# Patient Record
Sex: Female | Born: 1935 | Race: White | Hispanic: No | Smoking: Never smoker
Health system: Southern US, Community
[De-identification: ages and names within clinical notes are randomized; demographics above are authoritative.]

## PROBLEM LIST (undated history)

## (undated) DIAGNOSIS — I10 Essential (primary) hypertension: Secondary | ICD-10-CM

## (undated) DIAGNOSIS — K219 Gastro-esophageal reflux disease without esophagitis: Secondary | ICD-10-CM

## (undated) DIAGNOSIS — Z860100 Personal history of colon polyps, unspecified: Secondary | ICD-10-CM

## (undated) DIAGNOSIS — K5790 Diverticulosis of intestine, part unspecified, without perforation or abscess without bleeding: Secondary | ICD-10-CM

## (undated) DIAGNOSIS — D649 Anemia, unspecified: Secondary | ICD-10-CM

## (undated) HISTORY — DX: Gastro-esophageal reflux disease without esophagitis: K21.9

## (undated) HISTORY — DX: Anemia, unspecified: D64.9

## (undated) HISTORY — DX: Personal history of colon polyps, unspecified: Z86.0100

## (undated) HISTORY — PX: APPENDECTOMY: SHX54

## (undated) HISTORY — DX: Personal history of colonic polyps: Z86.010

## (undated) HISTORY — PX: ABDOMINAL HYSTERECTOMY: SHX81

## (undated) HISTORY — PX: FRACTURE SURGERY: SHX138

## (undated) HISTORY — PX: CHOLECYSTECTOMY: SHX55

---

## 1993-12-04 ENCOUNTER — Encounter (INDEPENDENT_AMBULATORY_CARE_PROVIDER_SITE_OTHER): Payer: Self-pay | Admitting: *Deleted

## 2000-01-23 ENCOUNTER — Ambulatory Visit (HOSPITAL_COMMUNITY): Admission: RE | Admit: 2000-01-23 | Discharge: 2000-01-23 | Payer: Self-pay | Admitting: Internal Medicine

## 2000-01-23 ENCOUNTER — Encounter: Payer: Self-pay | Admitting: Internal Medicine

## 2000-01-24 ENCOUNTER — Inpatient Hospital Stay (HOSPITAL_COMMUNITY): Admission: EM | Admit: 2000-01-24 | Discharge: 2000-01-26 | Payer: Self-pay | Admitting: *Deleted

## 2002-11-01 ENCOUNTER — Emergency Department (HOSPITAL_COMMUNITY): Admission: EM | Admit: 2002-11-01 | Discharge: 2002-11-01 | Payer: Self-pay | Admitting: Emergency Medicine

## 2002-11-01 ENCOUNTER — Encounter: Payer: Self-pay | Admitting: Emergency Medicine

## 2003-05-28 ENCOUNTER — Inpatient Hospital Stay (HOSPITAL_COMMUNITY): Admission: EM | Admit: 2003-05-28 | Discharge: 2003-06-02 | Payer: Self-pay | Admitting: Emergency Medicine

## 2003-05-28 ENCOUNTER — Encounter: Payer: Self-pay | Admitting: Emergency Medicine

## 2003-05-28 ENCOUNTER — Encounter: Payer: Self-pay | Admitting: Orthopedic Surgery

## 2003-05-30 ENCOUNTER — Encounter: Payer: Self-pay | Admitting: Internal Medicine

## 2003-05-31 ENCOUNTER — Encounter: Payer: Self-pay | Admitting: Cardiology

## 2003-06-01 ENCOUNTER — Encounter: Payer: Self-pay | Admitting: Internal Medicine

## 2004-07-17 ENCOUNTER — Ambulatory Visit: Payer: Self-pay | Admitting: Internal Medicine

## 2004-10-04 ENCOUNTER — Ambulatory Visit: Payer: Self-pay | Admitting: Internal Medicine

## 2004-11-14 ENCOUNTER — Ambulatory Visit: Payer: Self-pay | Admitting: Internal Medicine

## 2004-11-15 ENCOUNTER — Encounter: Admission: RE | Admit: 2004-11-15 | Discharge: 2004-11-15 | Payer: Self-pay | Admitting: Internal Medicine

## 2004-12-12 ENCOUNTER — Ambulatory Visit: Payer: Self-pay | Admitting: Internal Medicine

## 2005-01-09 ENCOUNTER — Ambulatory Visit: Payer: Self-pay | Admitting: Internal Medicine

## 2005-01-19 ENCOUNTER — Ambulatory Visit: Payer: Self-pay | Admitting: Cardiology

## 2005-02-26 ENCOUNTER — Ambulatory Visit: Payer: Self-pay | Admitting: Gastroenterology

## 2005-07-25 ENCOUNTER — Ambulatory Visit: Payer: Self-pay | Admitting: Internal Medicine

## 2005-09-05 ENCOUNTER — Ambulatory Visit: Payer: Self-pay | Admitting: Internal Medicine

## 2005-11-06 ENCOUNTER — Ambulatory Visit: Payer: Self-pay | Admitting: Internal Medicine

## 2005-11-13 ENCOUNTER — Ambulatory Visit: Payer: Self-pay | Admitting: *Deleted

## 2006-01-23 ENCOUNTER — Encounter: Admission: RE | Admit: 2006-01-23 | Discharge: 2006-01-23 | Payer: Self-pay | Admitting: Internal Medicine

## 2007-01-22 ENCOUNTER — Emergency Department (HOSPITAL_COMMUNITY): Admission: EM | Admit: 2007-01-22 | Discharge: 2007-01-22 | Payer: Self-pay | Admitting: Emergency Medicine

## 2007-11-28 DIAGNOSIS — Z86010 Personal history of colonic polyps: Secondary | ICD-10-CM | POA: Insufficient documentation

## 2008-04-14 ENCOUNTER — Ambulatory Visit: Payer: Self-pay | Admitting: Internal Medicine

## 2008-05-11 ENCOUNTER — Ambulatory Visit: Payer: Self-pay | Admitting: Internal Medicine

## 2008-05-11 DIAGNOSIS — R51 Headache: Secondary | ICD-10-CM | POA: Insufficient documentation

## 2008-05-11 DIAGNOSIS — D649 Anemia, unspecified: Secondary | ICD-10-CM | POA: Insufficient documentation

## 2008-05-11 DIAGNOSIS — R519 Headache, unspecified: Secondary | ICD-10-CM | POA: Insufficient documentation

## 2008-05-11 LAB — CONVERTED CEMR LAB
BUN: 13 mg/dL (ref 6–23)
Calcium: 9.4 mg/dL (ref 8.4–10.5)
Creatinine, Ser: 0.8 mg/dL (ref 0.4–1.2)
Eosinophils Absolute: 0.3 10*3/uL (ref 0.0–0.7)
Eosinophils Relative: 4.7 % (ref 0.0–5.0)
GFR calc Af Amer: 91 mL/min
GFR calc non Af Amer: 76 mL/min
HCT: 35.3 % — ABNORMAL LOW (ref 36.0–46.0)
MCV: 85.5 fL (ref 78.0–100.0)
Monocytes Absolute: 0.3 10*3/uL (ref 0.1–1.0)
Neutro Abs: 4.7 10*3/uL (ref 1.4–7.7)
Platelets: 246 10*3/uL (ref 150–400)
Potassium: 4.5 meq/L (ref 3.5–5.1)
WBC: 6.8 10*3/uL (ref 4.5–10.5)

## 2008-05-20 ENCOUNTER — Telehealth: Payer: Self-pay | Admitting: Internal Medicine

## 2008-05-24 ENCOUNTER — Encounter: Payer: Self-pay | Admitting: Gastroenterology

## 2008-05-24 ENCOUNTER — Encounter (INDEPENDENT_AMBULATORY_CARE_PROVIDER_SITE_OTHER): Payer: Self-pay | Admitting: *Deleted

## 2008-07-30 DIAGNOSIS — K921 Melena: Secondary | ICD-10-CM | POA: Insufficient documentation

## 2008-07-30 DIAGNOSIS — R1032 Left lower quadrant pain: Secondary | ICD-10-CM | POA: Insufficient documentation

## 2008-07-30 DIAGNOSIS — K5730 Diverticulosis of large intestine without perforation or abscess without bleeding: Secondary | ICD-10-CM | POA: Insufficient documentation

## 2008-07-30 DIAGNOSIS — R1310 Dysphagia, unspecified: Secondary | ICD-10-CM | POA: Insufficient documentation

## 2008-09-16 ENCOUNTER — Telehealth: Payer: Self-pay | Admitting: Internal Medicine

## 2008-10-19 ENCOUNTER — Encounter: Admission: RE | Admit: 2008-10-19 | Discharge: 2008-10-19 | Payer: Self-pay | Admitting: Internal Medicine

## 2008-10-29 ENCOUNTER — Ambulatory Visit: Payer: Self-pay | Admitting: Internal Medicine

## 2008-11-05 ENCOUNTER — Telehealth (INDEPENDENT_AMBULATORY_CARE_PROVIDER_SITE_OTHER): Payer: Self-pay | Admitting: *Deleted

## 2008-11-16 ENCOUNTER — Telehealth (INDEPENDENT_AMBULATORY_CARE_PROVIDER_SITE_OTHER): Payer: Self-pay | Admitting: *Deleted

## 2008-11-24 ENCOUNTER — Ambulatory Visit: Admission: RE | Admit: 2008-11-24 | Discharge: 2008-11-24 | Payer: Self-pay | Admitting: Family Medicine

## 2008-11-24 ENCOUNTER — Encounter: Payer: Self-pay | Admitting: Family Medicine

## 2008-11-24 ENCOUNTER — Ambulatory Visit: Payer: Self-pay | Admitting: Vascular Surgery

## 2010-10-02 ENCOUNTER — Encounter: Payer: Self-pay | Admitting: Internal Medicine

## 2010-10-02 ENCOUNTER — Encounter: Payer: Self-pay | Admitting: Orthopedic Surgery

## 2010-10-02 ENCOUNTER — Encounter: Payer: Self-pay | Admitting: Emergency Medicine

## 2010-11-01 ENCOUNTER — Other Ambulatory Visit: Payer: Self-pay | Admitting: Gastroenterology

## 2010-11-03 ENCOUNTER — Other Ambulatory Visit: Payer: Self-pay

## 2010-11-06 ENCOUNTER — Ambulatory Visit
Admission: RE | Admit: 2010-11-06 | Discharge: 2010-11-06 | Disposition: A | Discharge status: Home / Self Care | Payer: Medicare Other | Source: Ambulatory Visit | Attending: Gastroenterology | Admitting: Gastroenterology

## 2010-11-06 MED ORDER — IOHEXOL 300 MG/ML  SOLN
125.0000 mL | Freq: Once | INTRAMUSCULAR | Status: AC | PRN
  Administered 2010-11-06: 125 mL via INTRAVENOUS

## 2010-12-27 ENCOUNTER — Ambulatory Visit (HOSPITAL_COMMUNITY)
Admission: RE | Admit: 2010-12-27 | Discharge: 2010-12-27 | Disposition: A | Discharge status: Home / Self Care | Payer: Medicare Other | Source: Ambulatory Visit | Attending: Orthopedic Surgery | Admitting: Orthopedic Surgery

## 2010-12-27 DIAGNOSIS — M7989 Other specified soft tissue disorders: Secondary | ICD-10-CM | POA: Insufficient documentation

## 2010-12-27 DIAGNOSIS — M79609 Pain in unspecified limb: Secondary | ICD-10-CM | POA: Insufficient documentation

## 2011-01-26 NOTE — Discharge Summary (Signed)
NAME:  Ellen Johnson, CAYSON                         ACCOUNT NO.:  [id number]   MEDICAL RECORD NO.:  [id number]                   PATIENT TYPE:  INP   LOCATION:  3727                                 FACILITY:  [facility]   PHYSICIAN:  Rene Paci, M.D. [facility]          DATE OF BIRTH:  1936-06-03   DATE OF ADMISSION:  05/28/2003  DATE OF DISCHARGE:  06/02/2003                                 DISCHARGE SUMMARY   DISCHARGE DIAGNOSES:  1. Fall.  2. Syncope.  3. Right open ulnar fracture.  4. Right closed radius fracture.  5. Subarachnoid hemorrhage.  6. Post-concussion syndrome.   BRIEF ADMISSION HISTORY:  Ellen Johnson is a 75 year old white female who  worked until midnight on the evening prior to admission.  She went to bed  around 12:30 a.m.  She felt tired when she went to bed.  She woke up at 4  a.m. and she felt dizzy lying in the bed.  She got up to go to the bathroom.  She stated the room was spinning but she had no nausea or vomiting.  Walking  back to bed, she started down the stairs; she does not recall doing this.  She does not recall falling.  Son and husband found her but no loss of  consciousness; she was somewhat incoherent.  She has no prior history of  syncope or presyncope, no history of vertigo or arrhythmias or positional  dyspnea, no history of seizure and she had no loss of bladder or bowel  incontinence.  The patient was brought to the emergency room, where she was  found to have an open ulnar fracture.  The patient was admitted for  orthopedic surgery.   PAST MEDICAL HISTORY:  1. Remote peptic ulcer disease and gastroesophageal reflux disease.  2. Removal of a benign left breast lump.  3. Status post TAH/BSO secondary to what sounds like pelvic inflammatory     disease related to an IUD.   HOSPITAL COURSE:  PROBLEM #1 - ORTHOPEDICS:  As noted, the patient had a  fall, sustaining a right clavicle fracture as well as an open right ulnar  fracture and a closed right  radial fracture.  She underwent open reduction  and internal fixation of the right open fracture and a closed reduction of  the closed radius fracture.  The patient is currently in a cast and sling  and is scheduled to follow up with Dr. Elana Alm. Thurston Hole on Monday, June 07, 2003.   PROBLEM #2 - FALL:  Etiology of her fall sounded like it was syncopal.  Initially on presentation, she had no evidence of orthostatic hypotension  and no arrhythmias were noted.  We were concerned that perhaps this was  labyrinthitis versus BPPV.  She had an MRI on admission; this revealed a  widespread subarachnoid hemorrhage in the posterior fossa that was thought  to be post-traumatic, but they could not rule out underlying  ruptured  aneurysm or AVM.  The MRA was normal.  She had a head CT to follow up the  MRI that revealed small vessel disease and again the subarachnoid hemorrhage  without any skull fractures and again they thought it was most likely  posttraumatic, but could not rule out aneurysm versus dural AVM versus an  acute stroke.  This prompted a neurological consult.  The patient was seen  in consultation by Dr. Genene Churn. Love, who agreed that her subarachnoid  hemorrhage was probably traumatic.  He felt her dizziness was secondary to  BPPV.  He recommended an EEG, which was normal.  He also ordered a  cerebellar arteriogram that was essentially normal with no angiographic  evidence of intracranial aneurysms, AVMs, dissection of the intra or  extracranial vessels or stenosis to suggest spasm.  He also noted that the  venous sinuses were normally patent.  The patient is still having dizziness.  Dr. Sandria Manly thinks that she has a post-concussion syndrome secondary to the  trauma of the fall and the subarachnoid hemorrhage.  He recommended a low  dose of Ativan twice daily (b.i.d.) as well as amitriptyline at night.  He  stated she might be a good candidate for vestibular rehab, but not for at   least two weeks.  We will have the patient follow up with her primary care  physician, who can schedule or arrange outpatient vestibular rehab at [facility] if she is continuing to be symptomatic then at that followup.   PROBLEM #3 - INFECTIOUS DISEASE:  The patient was empirically started on  Cipro during this admission for mild fever and white count.  Etiology was  never clear, but we will have her complete a full 10-day course.   LABORATORIES PRIOR TO DISCHARGE:  Coagulations were normal.  Hemoglobin was  10.5, hematocrit was 30.5.  BMET was normal except for blood sugars in the  one-teens.  LFTs were normal.  TSH was 1.220.   MEDICATIONS AT DISCHARGE:  1. Cipro 500 mg b.i.d. for five more days.  2. Ativan 0.5 mg one-half tab b.i.d.  3. Amitriptyline 10 mg nightly.  4. Tylenol for pain.   ACTIVITY:  The patient has been instructed not to drive or work until she  follows up with Dr. Cristi Loron.   WOUND CARE:  Wound care is per orthopedics.   FOLLOWUP:  The patient does need to follow up with Dr. Thurston Hole on Monday,  June 07, 2003, and we will schedule followup with the patient with Dr.  Lovell Sheehan in two weeks.      Cornell Barman, P.A. [facility]                  Rene Paci, M.D. [facility]    LC/MEDQ  D:  06/02/2003  T:  06/03/2003  Job:  [id number]   cc:   Genene Churn. Love, M.D.  [address]  [address]  [place]  [place] [address]  Fax: [phone]   Stacie Glaze, M.D. [facility]   Robert A. Thurston Hole, M.D.  [address]  [place] [address]  Fax: [phone]

## 2011-01-26 NOTE — Discharge Summary (Signed)
[facility]  Patient:    Ellen Johnson, Ellen Johnson                      MRN: [id number] Adm. Date:  [id number] Disc. Date: [id number] Attending:  Heber Marshall CC:         Stacie Glaze, M.D. [facility], [place], [place]                           Discharge Summary  ADMISSION DIAGNOSES: 1. Diverticulitis with left lower quadrant abdominal pain. 2. Obstipation.  DISCHARGE DIAGNOSES: 1. Diverticulitis with left lower quadrant abdominal pain. 2. Obstipation.  HISTORY OF PRESENT ILLNESS:  Ellen Johnson is a 75 year old white married female with a history of constipation, who presented to the emergency department with severe abdominal pain.  She stated that over the past nine days, she has been unable to have a bowel movement despite multiple efforts with enemas, laxatives and dietary fiber.  She complains of crampy abdominal pain associated with nausea.  She complains of left-sided tenderness.  She denied any vomiting but does complain of nausea.  Please see the H&P for past medical history, family history and social history.  PHYSICAL EXAMINATION:  Physical exam at admission remarkable for temperature of 97.4, blood pressure was 100/60, abdomen with decreased bowel sounds, moderate-to-severe tenderness in the left lower quadrant with mild diffuse tenderness.  LABORATORY AND X-RAY FINDINGS:  Lab work was significant for a WBC of 7100 and electrolytes were normal.  Acute abdominal series showed no distention or obstruction.  There was increased amount of stool.  CT scan showed inflammatory changes in the sigmoid colon with possible mild bowel wall thickening consistent with diverticulitis.  HOSPITAL COURSE:  Patient was admitted to the hospital and started on Unasyn 3 g IV q.6h.  Patient also was given antiemetics.  The patient did well with this therapy.  She remained afebrile.  Her discomfort improved.  She did have minimal bowel movement.  With her  being afebrile, with the WBC being normal, with diagnosis being established, patient is felt to be stable and will be discharged home on oral antibiotics for an additional 10 days.  DISCHARGE EXAMINATION:  Patient was afebrile.  Abdomen:  She had hypoactive bowel sounds.  She had no guarding.  She did have significant tenderness in the left lower quadrant.  DISCHARGE MEDICATIONS: 1. Augmentin 875 mg b.i.d. for 10 days. 2. Phenergan 25 mg p.o. q.6h. p.r.n.  DISPOSITION:  Patient is discharged home.  She is carefully instructed to convalesce and to take her antibiotics.  She should notify the office if she has significant problem taking her antibiotics or if she has increasing pain. She is to use Milk of Magnesia for a gentle laxative.  Patient is scheduled to see Dr. Hedwig Johnson. Ellen Johnson, M.D. for GI followup on July 13th at 11 a.m.  CONDITION AT TIME OF DISCHARGE:  The patient is stable and improved. DD: 01/26/00 TD:  01/30/00 Job: [id number]

## 2011-01-26 NOTE — H&P (Signed)
[facility]  Patient:    Ellen Johnson, Ellen Johnson                      MRN: [id number] Adm. Date:  [id number] Attending:  Harrel Carina CC:         Ellen Johnson, M.D. [facility] [place] [facility]                         History and Physical  DATE OF BIRTH:  1936/09/01  HISTORY OF PRESENT ILLNESS:  Ellen Johnson is a 75 year old white female with a long history of constipation, who presented to the emergency room with severe abdominal pain.  She states that over the past nine days she has been unable to have a bowel movement despite multiple efforts with enemas, laxatives, and dietary fiber.  She does complain of crampy abdominal pain associated with nausea.  She also complains of left-sided tenderness.  She denies any vomiting but does complain of nausea.  She denies any fever but does complain of chills.  PAST MEDICAL HISTORY:  Significant for chronic constipation since childhood. The patient remembers using enemas as a child.  She is status post cholecystectomy.  She has a history of "low blood."  History of left breast lumpectomy x 3, all benign, by Ellen Johnson.  ALLERGIES:  VALIUM which "makes her crazy."  MEDICATIONS:  None.  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  She has never smoked. She drinks one glass of wine weekly.  She is married x 40 years, with four children.  She is presently raising her grandchildren ages 41, 10, and 57.  FAMILY HISTORY:  Grandmother died of breast cancer.  Father died of prostate cancer.  There are multiple members on her maternal side who had CVAs.  REVIEW OF SYSTEMS:  No vomiting, fever, or dysuria.  Other systems are negative.  PHYSICAL EXAMINATION:  VITAL SIGNS:  Blood pressure 100/60, pulse 72, respirations 16, temperature 97.4.  GENERAL:  She is a well-nourished, well-developed, white female, anxious but in no acute distress.  HEENT:  Atraumatic, normocephalic.  Sclerae without icterus.   Pupils are equal, round, and reactive to light.  Oropharynx is clear.  NECK:  Supple without adenopathy.  LUNGS:  Clear to auscultation throughout all fields.  No wheezing.  No rhonchi.  CARDIAC:  Mild tachycardic with S1 and S2.  There is no S3 or S4.  No rubs or gallops.  ABDOMEN:  Decreased bowel sounds.  There is moderate to severe tenderness to the left lower quadrant.  There is mild diffuse tenderness.  No masses are appreciated.  EXTREMITIES:  No edema.  There is no cyanosis or clubbing.  Pulses are 2+.  SKIN:  No rashes.  NEUROLOGIC:  The patient is anxious.  She is oriented and follows commands. Strength is normal.  Reflexes are symmetrical.  LABORATORY:  WBC 7100, hemoglobin 12.6.  Electrolytes within normal limits with BUN and creatinine 13 and 0.9, respectively.  Acute abdominal series shows no distention or obstruction.  There is increased amount of stool.  CT scan shows increase in amount of stool with inflammatory changes in the sigmoid colon and possible mild bowel wall thickening.  This is consistent with diverticulitis.  There is no abscess.  ASSESSMENT AND PLAN:  1. Diverticulitis.  The patient will be hydrated.  We will start Unasyn.     Exam will be followed.  Would consider gastrointestinal consult while     in-house, but the patient desires Dr. Lina Johnson and this will be set up     as an outpatient.  2. Abdominal pain.  Will send urinalysis and culture and sensitivity.     Abdominal pain is probably secondary to her diverticulitis.  3. Obstipation.  She will be hydrated and given clear liquids.  I will hold     off on laxatives or further enemas at this point.  4. Nausea.  She will be provided antiemetics as needed.  5. Anxiety.  Supportive therapy in the acute illness. DD:  01/24/00 TD:  01/24/00 Job: [id number]

## 2011-01-26 NOTE — Letter (Signed)
October 29, 2008    Ellen Johnson  [address]/[place], [place] [address]   RE:  [name], [name]  MRN:  [id number]  /  DOB:  [birth year]   Dear [name],   I am sorry to inform Ellen Johnson that we will have to dismiss Ellen Johnson from my  gastroenterology clinic since Ellen Johnson have not kept your appointments.  Ellen Johnson  were a no show on August 03, 2008, September 13, 2008, and in February 26, 2005.  Ellen Johnson had cancelled late yesterday for today's appointment.  Ellen Johnson  have four weeks to find  a new gastroenterologist.  In the meantime, if  there is an emergency, I will  take care of your GI needs.    Sincerely,      Hedwig Morton. Juanda Chance, MD  Electronically Signed    DMB/MedQ  DD: 10/29/2008  DT: 10/29/2008  Job #: [id number]

## 2011-01-26 NOTE — Op Note (Signed)
   NAME:  Ellen Johnson, Ellen Johnson                         ACCOUNT NO.:  [id number]   MEDICAL RECORD NO.:  [id number]                   PATIENT TYPE:  INP   LOCATION:  1827                                 FACILITY:  [facility]   PHYSICIAN:  Elana Alm. Thurston Hole, M.D.              DATE OF BIRTH:  02-10-1936   DATE OF PROCEDURE:  05/28/2003  DATE OF DISCHARGE:                                 OPERATIVE REPORT   PREOPERATIVE DIAGNOSIS:  Right open ulna fracture and closed distal radius  fracture.   POSTOPERATIVE DIAGNOSIS:  Right open ulna fracture and closed distal radius  fracture.   PROCEDURE:  1. Irrigation and debridement right open ulna fracture.  2. Open reduction and intermedullary rodding right open ulna fracture.  3. Closed reduction of right distal radius fracture.   SURGEON:  Elana Alm. Thurston Hole, M.D.   ASSISTANT:  Julien Girt, P.A.   ANESTHESIA:  General.   OPERATIVE TIME:  One hour.   COMPLICATIONS:  None.   DESCRIPTION OF PROCEDURE:  Ms. Prashad is brought to the operating room on  May 28, 2003 and placed on the operative table in a supine position.  After an adequate level of general anesthesia was obtained she received  Ancef 2 grams IV preoperatively for prophylaxis.  Her right arm and hand  were prepped using sterile Betadine and draped using sterile technique.  Initially, the traumatic wound which was approximately 10 cm was thoroughly  irrigated and debrided with 3 liters of pulse lavage and antibiotic  solution.  After this was done and thorough irrigation and debridement was  carried out, then an attempt at closed reduction of the ulna was tried but  this was unsuccessful.  A separate small 3 cm incision was then made over  the ulna shaft fracture and then the fracture was subperiosteally exposed  and reduced.  Then, from an olecranon site a 2.5 mm intermedullary pin was  placed from the olecranon distally down the ulna shaft under fluoroscopic  control and  across the fracture site and into the distal ulna shaft holding  the fracture in a reduced and anatomic position.  After this was done, then  both wounds were further irrigated with antibiotic solution and saline.  The  surgical wound was closed with 2-0 Vicryl and skin staples.  The traumatic  wound was closed loosely with interrupted 3-0 nylon mattress sutures.  Sterile dressings were applied and then the distal radius fracture was  reduced with palmar flexion and ulnar deviation reduction.  The arm was then  placed in a sugar tong splint.  After this was done the patient was  awakened, extubated, and taken to the recovery room in stable condition.  Needle and sponge counts correct x2 at the end of the case.      RAW/MEDQ  D:  05/28/2003  T:  05/29/2003  Job:  [id number]

## 2011-01-26 NOTE — H&P (Signed)
NAME:  Ellen Johnson, Ellen Johnson                         ACCOUNT NO.:  [id number]   MEDICAL RECORD NO.:  [id number]                   PATIENT TYPE:  INP   LOCATION:  1827                                 FACILITY:  [facility]   PHYSICIAN:  Elana Alm. Thurston Hole, M.D.              DATE OF BIRTH:  08/28/1936   DATE OF ADMISSION:  05/28/2003  DATE OF DISCHARGE:                                HISTORY & PHYSICAL   ADMITTING DIAGNOSES:  1. Right open ulnar fracture.  2. Right distal radius fracture.  3. Right clavicle fracture.  4. Syncopal episode.  5. History of gastroesophageal reflux.  6. History of removal of benign breast lumps on the left.  7. History of pelvic inflammatory disease leading to total abdominal     hysterectomy and bilateral salpingo-oophorectomy.   HISTORY OF PRESENT ILLNESS:  The patient is a 75 year old white female who  worked till midnight last night, went to bed at about 12:30, woke up at 4  a.m. and stated she felt dizzy by lying in bed.  She got up to go to the  bathroom and the room was spinning.  She was walking back to bed and started  down the stairs; she does not recall falling.  Her husband her son found her  incoherent at the bottom of the stairs.  She was taken to [facility] ER, at  that time noted to have a right clavicle fracture, a large laceration on her  right arm and ulnar fracture and a distal radius fracture.   ALLERGIES:  VALIUM.   CURRENT MEDICATIONS:  1. She takes 11 different vitamins.  2. Aspirin 325 mg a day.   MEDICAL HISTORY:  Medical history significant for peptic ulcer disease,  reflux, benign breast lumps.   SURGICAL HISTORY:  Surgical history significant for a TAH and a BSO  secondary to PID related to an IUD.   FAMILY HISTORY:  Family history significant for GYN cancer, breast cancer  and stroke.   SOCIAL HISTORY:  The patient is married with four children.  She is  currently raising three grandchildren.  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  Occasionally drinks wine. States she smokes a cigarette a month.   REVIEW OF SYSTEMS:  Review of systems positive for recent episodes of  dizziness.  No shortness of breath.  No chest pain.  No nausea, vomiting,  diarrhea, constipation, shortness of breath.   PHYSICAL EXAMINATION:  VITAL SIGNS:  Heart rate 80, 146/82, respirations 20,  temperature 98.2.  HEENT:  She is normocephalic, atraumatic.  Extraocular movements are intact.  Pupils are equally round and reactive to light and accommodation.  SKIN/MUSCULOSKELETAL:  She has a large laceration on her right forearm.  She  has an obviously swollen right wrist.  She has tenderness over her right  shoulder.  She had no pain on her left side.  She has no lower extremity  pain.  CHEST AND LUNGS:  Clear to auscultation.  HEART:  Heart has a regular rate and rhythm with no murmur, rub or gallop.  ABDOMEN:  Abdomen is soft, nontender with positive bowel sounds.  EXTREMITIES:  Right arm:  Large laceration.  Right wrist swollen.  Distal  neurovascular exam is intact.  NEUROLOGIC:  She is alert and oriented, slightly verbose, currently had  morphine.   ASSESSMENT:  1. Dizziness, loss of consciousness, fall.  2. Right forearm open fracture.  3. Right wrist distal radius intra-articular fracture, closed.  4. Right clavicle fracture, closed.   PLAN:  At this point in time, she will be admitted.  She has received  medical clearance from [facility] for surgery to undergo open  reduction and internal fixation and irrigation and debridement of her right  forearm.  Her clavicle will be treated closed.  Her wrist will be treated  closed.  We will continue to follow her in the hospital.      Julien Girt, P.A.                  Robert A. Thurston Hole, M.D.    KS/MEDQ  D:  05/28/2003  T:  05/30/2003  Job:  [id number]

## 2011-01-26 NOTE — Consult Note (Signed)
NAME:  Ellen Johnson, Ellen Johnson                         ACCOUNT NO.:  [id number]   MEDICAL RECORD NO.:  [id number]                   PATIENT TYPE:  INP   LOCATION:  3727                                 FACILITY:  [facility]   PHYSICIAN:  Genene Churn. Love, M.D.                 DATE OF BIRTH:  November 29, 1935   DATE OF CONSULTATION:  05/31/2003  DATE OF DISCHARGE:                                   CONSULTATION   CHIEF COMPLAINT:  This 75 year old right-handed white married female was  admitted May 28, 2003 for evaluation of head trauma with documented  subarachnoid hemorrhage.   HISTORY OF PRESENT ILLNESS:  Mrs. 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  She awoke at 4 a.m. and noted dizziness which was vague in  quality.  She went to the bathroom and does not remember any other events.  The stairwell is next to the bathroom and she fell down eight steps to the  first landing.  The noise was heard by her son and husband who arrived and  found the patient unconscious.  The next thing the patient remembers is  being in the [facility] Emergency Room.  She had a fracture to her  right clavicle and her right radius and right ulnar bone.  She also had  swelling to her right wrist.  She has complained of some headache and  dizziness since.  An MRI study of the brain showed blood in the subarachnoid  space posteriorly and also an anti hemispheric fissure.  There was no  evidence of an acute stroke and intracranial MRA was negative.  A CT scan  confirmed the blood present.  The patient has no family history of aneurysm  and has had no prior history of seizures.  She denies any warning macropsy,  micropsy, deejay vu, strange odors or tastes.  She has no known history of  high blood pressure or diabetes.  There is no history of drug or alcohol  abuse.   PAST MEDICAL HISTORY:  1. Hysterectomy in 1991.  2.  Cholecystectomy in 1996.   CURRENT MEDICATIONS:  11 vitamins.   PHYSICAL EXAMINATION:  GENERAL:  Well-developed white female with a cast on  her right arm and right arm in a sling and also a figure-eight for her  collar bone.  She was alert and oriented x3 and followed one, two, and three  step commands.  VITAL SIGNS:  Blood pressure left arm lying 120/80, standing 120/80, heart  rate 104.  NECK:  Supple.  HEENT:  Tympanic membranes were clear.  There was no evidence of any  hemotympanum and no CSF drainage present.  NEUROLOGIC:  Cranial nerve examination revealed visual fields full, disks  flat, spontaneous venous pulsations seen.  Extraocular movements full.  No  7th nerve palsy.  Tongue was midline and not bit.  Uvula was midline.  Gags  were present.  Sternocleidomastoid, trapezius testing was normal.  Motor  examination revealed in the cast she could move each finger in her right  hand and sensory in the five fingers of the right hand was normal.  She  could abduct her right arm at the shoulder.  Otherwise, I could not test her  right arm.  Her left arm was essentially 5/5.  Her legs were 5/5 two point  discrimination grab procedure was present in her left hand.  Pin prick,  vibration was present bilaterally.  Deep tendon reflexes were 2+.  Plantar  responses were downgoing.  She could stand.  When looking down she developed  clockwise nystagmus which lasted several seconds associated with vertigo.   IMPRESSION:  1. Subarachnoid hemorrhage probably posttraumatic (code 430).  2. Dizziness with suspected BT TV on examination (code 386.11).  3. Concussion (code 850.2).  4. Syncope (code 780.2).   PLAN:  Proceed with EEG and consider arteriogram depending on the results.                                               Genene Churn. Sandria Manly, M.D.    JML/MEDQ  D:  05/31/2003  T:  05/31/2003  Job:  [id number]

## 2011-08-15 ENCOUNTER — Ambulatory Visit (INDEPENDENT_AMBULATORY_CARE_PROVIDER_SITE_OTHER): Payer: Medicare Other

## 2011-08-15 DIAGNOSIS — Z23 Encounter for immunization: Secondary | ICD-10-CM

## 2011-10-11 ENCOUNTER — Ambulatory Visit (INDEPENDENT_AMBULATORY_CARE_PROVIDER_SITE_OTHER): Payer: Medicare Other | Admitting: Family Medicine

## 2011-10-11 ENCOUNTER — Other Ambulatory Visit: Payer: Self-pay

## 2011-10-11 ENCOUNTER — Ambulatory Visit: Payer: Medicare Other

## 2011-10-11 VITALS — BP 154/85 | HR 71 | Temp 97.8°F | Resp 16 | Ht 63.5 in | Wt 205.6 lb

## 2011-10-11 DIAGNOSIS — M79609 Pain in unspecified limb: Secondary | ICD-10-CM

## 2011-10-11 DIAGNOSIS — M79651 Pain in right thigh: Secondary | ICD-10-CM

## 2011-10-11 LAB — POCT CBC
Granulocyte percent: 59.7 %G (ref 37–80)
HCT, POC: 37.1 % — AB (ref 37.7–47.9)
Hemoglobin: 11.7 g/dL — AB (ref 12.2–16.2)
MCV: 84 fL (ref 80–97)
POC LYMPH PERCENT: 33 %L (ref 10–50)
RBC: 4.42 M/uL (ref 4.04–5.48)

## 2011-10-11 MED ORDER — MELOXICAM 15 MG PO TABS: 15.0000 mg | ORAL_TABLET | Freq: Every day | ORAL | Status: AC

## 2011-10-11 MED ORDER — TRAMADOL HCL 50 MG PO TABS: ORAL_TABLET | ORAL | Status: DC

## 2011-10-11 NOTE — Progress Notes (Signed)
  Subjective:    Patient ID: Ellen Johnson, female    DOB: [birth year], 76 y.o.   MRN: [id number]  HPI: Complaining of right thigh pain since about 6 am today.  Awoke with the pain.  Worse with weightbearing.  Has been using ibuprofen and an ace wrap.  Limping.  No groin pain.  No calf pain. Has noted numbness in right thigh. No back pain.  No fever/chills.  No CP/SOB.    Noted treated for HTN.  Review of Systems  Constitutional: Negative.   HENT: Negative.   Respiratory: Negative for chest tightness.   Cardiovascular: Negative for chest pain.  Musculoskeletal: Positive for gait problem.  Skin: Negative.   Neurological: Negative for dizziness, syncope, weakness and headaches.  N   Objective:   Physical Exam  Constitutional: She is oriented to person, place, and time. She appears well-developed and well-nourished. No distress.  HENT:  Head: Atraumatic.  Eyes: Conjunctivae are normal. Pupils are equal, round, and reactive to light.  Cardiovascular: Normal rate and regular rhythm.  Exam reveals no gallop.   Murmur heard. Pulmonary/Chest: Effort normal and breath sounds normal.  Abdominal: Soft. There is no tenderness.  Musculoskeletal: She exhibits tenderness.  Neurological: She is alert and oriented to person, place, and time.  Skin: Skin is warm and dry. She is not diaphoretic.     [facility] reading (PRIMARY) by  Dr. Althea Charon normal 2v femur xrays.      Assessment & Plan:  Right anterior thigh pain -  Suspect soft tissue. Mobic 15 mg 1 tab po qd prn. Tylenol prn. Tramadol 50 mg 1-2 tab po tid prn #30.  Knee immobilizer.  F/u in 2-3 days for recheck.

## 2011-10-11 NOTE — Patient Instructions (Signed)
Return to the clinic in 2-3 days for recheck.  If your symptoms worsen return sooner or go to the ER. The tramadol will make you drowsy. Use the immobilizer as needed.

## 2011-10-13 LAB — COMPREHENSIVE METABOLIC PANEL
Albumin: 4.2 g/dL (ref 3.5–5.2)
BUN: 17 mg/dL (ref 6–23)
CO2: 24 mEq/L (ref 19–32)
Calcium: 9.5 mg/dL (ref 8.4–10.5)
Chloride: 103 mEq/L (ref 96–112)
Glucose, Bld: 103 mg/dL — ABNORMAL HIGH (ref 70–99)
Potassium: 4.4 mEq/L (ref 3.5–5.3)
Sodium: 136 mEq/L (ref 135–145)
Total Protein: 6.6 g/dL (ref 6.0–8.3)

## 2011-10-13 LAB — CK TOTAL AND CKMB (NOT AT ARMC)
CK, MB: 2.8 ng/mL (ref 0.3–4.0)
Total CK: 95 U/L (ref 7–177)

## 2012-04-30 ENCOUNTER — Telehealth: Payer: Self-pay

## 2012-04-30 MED ORDER — ZOSTER VACCINE LIVE 19400 UNT/0.65ML ~~LOC~~ SOLR: 0.6500 mL | Freq: Once | SUBCUTANEOUS | Status: AC

## 2012-04-30 NOTE — Telephone Encounter (Signed)
Done

## 2012-04-30 NOTE — Telephone Encounter (Signed)
PT IS REQUESTING AN ORDER FOR SHINGLE VACCINE PLEASE CALL [phone]  OR CELL [phone]

## 2012-05-01 NOTE — Telephone Encounter (Signed)
lmom to shingles vaccine order is ready to pick up.

## 2012-06-27 ENCOUNTER — Ambulatory Visit (INDEPENDENT_AMBULATORY_CARE_PROVIDER_SITE_OTHER): Payer: Medicare Other

## 2012-06-27 DIAGNOSIS — Z23 Encounter for immunization: Secondary | ICD-10-CM

## 2012-08-20 ENCOUNTER — Ambulatory Visit: Payer: Medicare Other

## 2012-08-29 ENCOUNTER — Ambulatory Visit: Payer: Medicare Other | Admitting: Family Medicine

## 2012-10-02 ENCOUNTER — Ambulatory Visit: Payer: Medicare Other | Admitting: Family Medicine

## 2012-10-06 ENCOUNTER — Ambulatory Visit (INDEPENDENT_AMBULATORY_CARE_PROVIDER_SITE_OTHER): Payer: Medicare Other | Admitting: Family Medicine

## 2012-10-06 VITALS — BP 134/78 | HR 80 | Temp 97.9°F | Resp 20 | Ht 63.0 in | Wt 197.4 lb

## 2012-10-06 DIAGNOSIS — E538 Deficiency of other specified B group vitamins: Secondary | ICD-10-CM

## 2012-10-06 DIAGNOSIS — T50915A Adverse effect of multiple unspecified drugs, medicaments and biological substances, initial encounter: Secondary | ICD-10-CM

## 2012-10-06 DIAGNOSIS — L270 Generalized skin eruption due to drugs and medicaments taken internally: Secondary | ICD-10-CM

## 2012-10-06 MED ORDER — CYANOCOBALAMIN 1000 MCG/ML IJ SOLN
1000.0000 ug | Freq: Once | INTRAMUSCULAR | Status: AC
  Administered 2012-10-06: 1000 ug via INTRAMUSCULAR

## 2012-10-06 MED ORDER — METHYLPREDNISOLONE ACETATE 80 MG/ML IJ SUSP
80.0000 mg | Freq: Once | INTRAMUSCULAR | Status: AC
  Administered 2012-10-06: 80 mg via INTRAMUSCULAR

## 2012-10-06 NOTE — Progress Notes (Signed)
This 77 year old woman with 48 hours of intense itching on her upper anterior chest with no known cause. She's had no change in medication no new fabrics no new jewelry and no new soap. The discomfort is described as burning and itching intensely. He's having no shortness of breath or mild swelling  Objective: Diffuse erythema in the anterior chest, no swelling in the mouth, lungs are clear examined. Examination of other areas reveals no significant hives or rash  Assessment: Acute allergic reaction  Plan: Depo-Medrol

## 2013-02-15 ENCOUNTER — Ambulatory Visit: Payer: Medicare Other

## 2013-02-15 ENCOUNTER — Ambulatory Visit (INDEPENDENT_AMBULATORY_CARE_PROVIDER_SITE_OTHER): Payer: Medicare Other | Admitting: Emergency Medicine

## 2013-02-15 VITALS — BP 148/72 | HR 78 | Temp 98.0°F | Resp 16 | Ht 64.0 in | Wt 203.0 lb

## 2013-02-15 DIAGNOSIS — S5001XA Contusion of right elbow, initial encounter: Secondary | ICD-10-CM

## 2013-02-15 DIAGNOSIS — M25521 Pain in right elbow: Secondary | ICD-10-CM

## 2013-02-15 DIAGNOSIS — M25529 Pain in unspecified elbow: Secondary | ICD-10-CM

## 2013-02-15 DIAGNOSIS — S5000XA Contusion of unspecified elbow, initial encounter: Secondary | ICD-10-CM

## 2013-02-15 MED ORDER — NAPROXEN SODIUM 550 MG PO TABS: 550.0000 mg | ORAL_TABLET | Freq: Two times a day (BID) | ORAL | Status: AC

## 2013-02-15 MED ORDER — TRAMADOL HCL 50 MG PO TABS: 50.0000 mg | ORAL_TABLET | Freq: Four times a day (QID) | ORAL | Status: DC | PRN

## 2013-02-15 NOTE — Progress Notes (Signed)
[facility] [address], [place] [address] [phone]  Date:  02/15/2013   Name:  Ellen Johnson   DOB:  [birth year]   MRN:  [id number]  PCP:  Carrie Mew, MD    Chief Complaint: Arm Injury   History of Present Illness:  Ellen Johnson is a 77 y.o. very pleasant female patient who presents with the following:  History of fracture arm in distant past.  Larey Seat and landed on her right arm yesterday morning. Was able to go to work following. This morning awakened in severe pain.  Has pain in upper arm and forearm.  No other complaints.    Patient Active Problem List   Diagnosis Date Noted  . DIVERTICULOSIS OF COLON 07/30/2008  . Blood in Stool 07/30/2008  . DYSPHAGIA UNSPECIFIED 07/30/2008  . ABDOMINAL PAIN, LEFT LOWER QUADRANT 07/30/2008  . ANEMIA-NOS 05/11/2008  . HYPERTENSION, SYSTOLIC, BORDERLINE 05/11/2008  . GERD 05/11/2008  . HEADACHE 05/11/2008  . COLONIC POLYPS, HX OF 11/28/2007    No past medical history on file.  Past Surgical History  Procedure Laterality Date  . Abdominal hysterectomy    . Gall bladder removed    . Fracture surgery      History  Substance Use Topics  . Smoking status: Never Smoker   . Smokeless tobacco: Not on file  . Alcohol Use: Yes    Family History  Problem Relation Age of Onset  . Stroke Mother   . Cancer Father   . Cancer Maternal Grandmother     Allergies  Allergen Reactions  . Valium (Diazepam) Other (See Comments)    Totally different personality    Medication list has been reviewed and updated.  Current Outpatient Prescriptions on File Prior to Visit  Medication Sig Dispense Refill  . traMADol (ULTRAM) 50 MG tablet 1-2 tabs po tid prn  30 tablet  0   No current facility-administered medications on file prior to visit.    Review of Systems:  As per HPI, otherwise negative.    Physical Examination: Filed Vitals:   02/15/13 1412  BP: 148/72  Pulse: 78  Temp: 98 F (36.7  C)  Resp: 16   Filed Vitals:   02/15/13 1412  Height: 5\' 4"  (1.626 m)  Weight: 203 lb (92.08 kg)   Body mass index is 34.83 kg/(m^2). Ideal Body Weight: Weight in (lb) to have BMI = 25: 145.3   GEN: WDWN, NAD, Non-toxic, Alert & Oriented x 3 HEENT: Atraumatic, Normocephalic.  Ears and Nose: No external deformity. EXTR: No clubbing/cyanosis/edema NEURO: Normal gait.  PSYCH: Normally interactive. Conversant. Not depressed or anxious appearing.  Calm demeanor.  ARM:  Tender elbow and proximal forearm.  NATI    Assessment and Plan: Contusion elbow Anaprox Tramadol  Sling Follow up as needed   Signed,  Phillips Odor, MD   [facility] reading (PRIMARY) by  Dr. Dareen Piano.  Humerus:  negative.  [facility] reading (PRIMARY) by  Dr. Dareen Piano.  Forearm:  Ortho metal.  No osseous injury  [facility] reading (PRIMARY) by  Dr. Dareen Piano:  Elbow:  No osseous injury.

## 2013-02-15 NOTE — Patient Instructions (Addendum)
Elbow Contusion  An elbow contusion is a deep bruise of the elbow. Contusions are the result of an injury that caused bleeding under the skin. The contusion may turn blue, purple, or yellow. Minor injuries will give you a painless contusion, but more severe contusions may stay painful and swollen for a few weeks.   CAUSES   An elbow contusion comes from a direct force to that area, such as falling on the elbow.  SYMPTOMS    Swelling and redness of the elbow.   Bruising of the elbow area.   Tenderness or soreness of the elbow.  DIAGNOSIS   You will have a physical exam and will be asked about your history. You may need an X-ray of your elbow to look for a broken bone (fracture).   TREATMENT   A sling or splint may be needed to support your injury. Resting, elevating, and applying cold compresses to the elbow area are often the best treatments for an elbow contusion. Over-the-counter medicines may also be recommended for pain control.  HOME CARE INSTRUCTIONS    Put ice on the injured area.   Put ice in a plastic bag.   Place a towel between your skin and the bag.   Leave the ice on for 15-20 minutes, 3-4 times a day.   Only take over-the-counter or prescription medicines for pain, discomfort, or fever as directed by your caregiver.   Rest your injured elbow until the pain and swelling are better.   Elevate your elbow to reduce swelling.   Apply a compression wrap as directed by your caregiver. This can help reduce swelling and motion. You may remove the wrap for sleeping, showers, and baths. If your fingers become numb, cold, or blue, take the wrap off and reapply it more loosely.   Use your elbow only as directed by your caregiver. You may be asked to do range of motion exercises. Do them as directed.   See your caregiver as directed. It is very important to keep all follow-up appointments in order to avoid any long-term problems with your elbow, including chronic pain or inability to move your elbow  normally.  SEEK IMMEDIATE MEDICAL CARE IF:    You have increased redness, swelling, or pain in your elbow.   Your swelling or pain is not relieved with medicines.   You have swelling of the hand and fingers.   You are unable to move your fingers or wrist.   You begin to lose feeling in your hand or fingers.   Your fingers or hand become cold or blue.  MAKE SURE YOU:    Understand these instructions.   Will watch your condition.   Will get help right away if you are not doing well or get worse.  Document Released: 08/05/2006 Document Revised: 11/19/2011 Document Reviewed: 07/13/2011  [employer] Patient Information 2014 [employer].

## 2013-02-16 ENCOUNTER — Telehealth: Payer: Self-pay | Admitting: Radiology

## 2013-02-16 NOTE — Telephone Encounter (Signed)
Findings: AP and lateral views of the right elbow demonstrate no acute displaced fracture, subluxation or dislocation. 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 Intramedullary rod in the right ulna incompletely visualized.  IMPRESSION: 1. No acute radiographic abnormality of the right elbow.  Spoke to patient about her report. She is advised the report is normal. She is still having pain and swelling/ she states [facility]. She states she will call tomorrow and get her own ortho appt.

## 2013-05-07 ENCOUNTER — Ambulatory Visit (INDEPENDENT_AMBULATORY_CARE_PROVIDER_SITE_OTHER): Payer: Medicare Other | Admitting: Family Medicine

## 2013-05-07 VITALS — BP 140/70 | HR 90 | Temp 98.0°F | Resp 18 | Ht 62.5 in | Wt 203.0 lb

## 2013-05-07 DIAGNOSIS — E538 Deficiency of other specified B group vitamins: Secondary | ICD-10-CM

## 2013-05-07 DIAGNOSIS — F4329 Adjustment disorder with other symptoms: Secondary | ICD-10-CM

## 2013-05-07 MED ORDER — CYANOCOBALAMIN 1000 MCG/ML IJ SOLN
1000.0000 ug | Freq: Once | INTRAMUSCULAR | Status: AC
  Administered 2013-05-07: 1000 ug via INTRAMUSCULAR

## 2013-05-07 NOTE — Progress Notes (Signed)
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 Has been very stressful and patient tends to get tired and worried. She's here today for B12 shot because of her B12 deficiency.  Objective: Spent 20 minutes face-to-face with patient and subsequently 30 minutes face-to-face with her patient's husband regarding the imminent demise  Assessment: B12 deficiency, stress  Plan:Vitamin B12 deficiency - Plan: cyanocobalamin ((VITAMIN B-12)) injection 1,000 mcg  Signed, Elvina Sidle, MD

## 2013-05-21 ENCOUNTER — Telehealth: Payer: Self-pay

## 2013-05-21 NOTE — Telephone Encounter (Signed)
Pt's daughter is calling because she is concerned about her mother, She has been really emotional and has experienced trouble sleeping due to the passing of her husband Scarlettrose Costilow. Pts daughter would like to know if you could speak with her mother to make sure that she is okay. Best# [phone] (daughter/Beth Murdic) [phone] ( pts #)

## 2013-07-02 ENCOUNTER — Ambulatory Visit: Payer: Medicare Other | Admitting: Family Medicine

## 2013-07-23 ENCOUNTER — Encounter: Payer: Self-pay | Admitting: Family Medicine

## 2013-07-23 ENCOUNTER — Ambulatory Visit (INDEPENDENT_AMBULATORY_CARE_PROVIDER_SITE_OTHER): Payer: Medicare Other | Admitting: Family Medicine

## 2013-07-23 VITALS — BP 148/62 | HR 89 | Temp 98.3°F | Resp 16 | Ht 63.0 in | Wt 207.2 lb

## 2013-07-23 DIAGNOSIS — F4321 Adjustment disorder with depressed mood: Secondary | ICD-10-CM

## 2013-07-23 DIAGNOSIS — Z23 Encounter for immunization: Secondary | ICD-10-CM

## 2013-07-23 DIAGNOSIS — J3489 Other specified disorders of nose and nasal sinuses: Secondary | ICD-10-CM

## 2013-07-23 DIAGNOSIS — L989 Disorder of the skin and subcutaneous tissue, unspecified: Secondary | ICD-10-CM

## 2013-07-23 NOTE — Progress Notes (Signed)
Is a 77 year old woman who grieving for her husband who died 09/30/2024after a long struggle with lung cancer. She has a daughter who is in rehabilitation now and is hopefully getting out in late December or first of January.  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 She is able to avoid this by the skin of her teeth. 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 She's trying to get her daughter back to live with her.  Patient has 2 daughters and 2 sons. She's a former debutante, but married Ed and made a good life with him above her father's objections.  She spent time reminiscent about her husband and their life together. She also brought me his obituary.  She complains about skin lesions over the bridge of her nose and over the right hip. He's been enlarging, scaling, and bleeding intermittently.  Patient's also been having some indigestion and plans on seeing Dr. Eliot Ford for followup of this.  Objective: Patient tearful but appropriate and is able to smile and look forward to having her daughter live with her. HEENT: Unremarkable Chest: Clear Heart: Regular no murmur Neck: Supple no adenopathy or bruits Skin: Crusty rash over bridge of nose, 1 cm raised scaly well-circumscribed lesions over the right hip   Liquid N2 applied to skin rashes  Need for prophylactic vaccination and inoculation against influenza - Plan: Flu Vaccine QUAD 36+ mos IM  Grieving  Non-healing skin lesion of nose  Signed, Elvina Sidle, MD

## 2013-08-04 ENCOUNTER — Other Ambulatory Visit: Payer: Self-pay | Admitting: Family Medicine

## 2013-08-04 NOTE — Telephone Encounter (Signed)
Dr. Milus Glazier, I do not see this medication on the current medication list, not on the historical medication list. Please advise.

## 2013-08-27 ENCOUNTER — Ambulatory Visit: Payer: Medicare Other | Admitting: Family Medicine

## 2013-10-08 ENCOUNTER — Encounter: Payer: Self-pay | Admitting: Family Medicine

## 2013-10-08 ENCOUNTER — Ambulatory Visit (INDEPENDENT_AMBULATORY_CARE_PROVIDER_SITE_OTHER): Payer: Medicare Other | Admitting: Family Medicine

## 2013-10-08 ENCOUNTER — Telehealth: Payer: Self-pay | Admitting: Radiology

## 2013-10-08 ENCOUNTER — Ambulatory Visit (HOSPITAL_COMMUNITY)
Admission: RE | Admit: 2013-10-08 | Discharge: 2013-10-08 | Disposition: A | Discharge status: Home / Self Care | Payer: Medicare Other | Source: Ambulatory Visit | Attending: Family Medicine | Admitting: Family Medicine

## 2013-10-08 VITALS — BP 128/72 | HR 88 | Temp 98.4°F | Resp 18 | Ht 64.5 in | Wt 212.0 lb

## 2013-10-08 DIAGNOSIS — M7989 Other specified soft tissue disorders: Secondary | ICD-10-CM

## 2013-10-08 DIAGNOSIS — J209 Acute bronchitis, unspecified: Secondary | ICD-10-CM

## 2013-10-08 DIAGNOSIS — M79609 Pain in unspecified limb: Secondary | ICD-10-CM

## 2013-10-08 DIAGNOSIS — F4321 Adjustment disorder with depressed mood: Secondary | ICD-10-CM

## 2013-10-08 DIAGNOSIS — R21 Rash and other nonspecific skin eruption: Secondary | ICD-10-CM

## 2013-10-08 DIAGNOSIS — M79662 Pain in left lower leg: Secondary | ICD-10-CM

## 2013-10-08 DIAGNOSIS — R600 Localized edema: Secondary | ICD-10-CM

## 2013-10-08 MED ORDER — FUROSEMIDE 20 MG PO TABS: 20.0000 mg | ORAL_TABLET | Freq: Every day | ORAL | Status: DC

## 2013-10-08 MED ORDER — AZITHROMYCIN 250 MG PO TABS: ORAL_TABLET | ORAL | Status: DC

## 2013-10-08 MED ORDER — FLUOCINONIDE 0.05 % EX CREA: TOPICAL_CREAM | CUTANEOUS | Status: DC

## 2013-10-08 NOTE — Progress Notes (Signed)
Subjective:  This chart was scribed for [name], MD by Donato Schultz, Medical Scribe. This patient was seen in Room 27 and the patient's care was started at 12:24 PM.   Patient ID: Ellen Johnson, female    DOB: Mar 18, 1936, 78 y.o.   MRN: [id number]  Cough Associated symptoms include a rash. Pertinent negatives include no ear pain, fever or sore throat.  Leg Pain    HPI Comments: Ellen Johnson is a 78 y.o. female who presents to the [facility] complaining of constant left leg pain that started on Saturday after the patient hit the side of her left leg against something at work.  She states that on Sunday she noticed a knot on her left leg and felt a hot and cold sensations and pain when she presses against the knot.    The patient is also complaining of a constant cough that started a couple of weeks ago.  She denies fever, sore throat, nausea, and ear ache as associated symptoms.    The patient states that she uses Fluocinonide cream three times a day and the rash is still there.  She states that if she does not apply the cream, the rash will grow in size.    She states that dealing with her husband's passing is still very difficult but she is taking everything one day at a time.     No past medical history on file. Past Surgical History  Procedure Laterality Date  . Abdominal hysterectomy    . Gall bladder removed    . Fracture surgery     Family History  Problem Relation Age of Onset  . Stroke Mother   . Cancer Father   . Cancer Maternal Grandmother    History   Social History  . Marital Status: Married    Spouse Name: N/A    Number of Children: N/A  . Years of Education: N/A   Occupational History  . Not on file.   Social History Main Topics  . Smoking status: Never Smoker   . Smokeless tobacco: Not on file  . Alcohol Use: Yes  . Drug Use: Not on file  . Sexual Activity: Not on file   Other Topics Concern  . Not on file    Social History Narrative  . No narrative on file   Allergies  Allergen Reactions  . Valium [Diazepam] Other (See Comments)    Totally different personality     Review of Systems  Constitutional: Negative for fever.  HENT: Positive for voice change. Negative for ear pain and sore throat.   Respiratory: Positive for cough.   Gastrointestinal: Positive for nausea.  Musculoskeletal: Positive for arthralgias (left leg).  Skin: Positive for rash.  All other systems reviewed and are negative.     Objective:  Physical Exam  Nursing note and vitals reviewed. Constitutional: She is oriented to person, place, and time. She appears well-developed and well-nourished.  HENT:  Head: Normocephalic and atraumatic.  Right Ear: External ear normal.  Left Ear: External ear normal.  Mouth/Throat: Oropharynx is clear and moist.  Eyes: EOM are normal. Pupils are equal, round, and reactive to light.  Neck: Normal range of motion. No JVD present. No tracheal deviation present. No thyromegaly present.  Cardiovascular: Normal rate, regular rhythm, normal heart sounds and intact distal pulses.  Exam reveals no gallop and no friction rub.   No murmur heard. Pulmonary/Chest: Effort normal. No stridor. No respiratory distress. She has  wheezes. She has no rales.  Abdominal: Soft. She exhibits no distension. There is no tenderness.  Musculoskeletal: Normal range of motion.  Lymphadenopathy:    She has no cervical adenopathy.  Neurological: She is alert and oriented to person, place, and time.  Skin: Skin is warm and dry.  Psychiatric: She has a normal mood and affect. Her behavior is normal. Judgment and thought content normal.  Tears up when remembering Percell Miller who passed away last 06-12-23.      BP 128/72  Pulse 88  Temp(Src) 98.4 F (36.9 C)  Resp 18  Ht 5' 4.5" (1.638 m)  Wt 212 lb (96.163 kg)  BMI 35.84 kg/m2  SpO2 94% Assessment & Plan:    I personally performed the services  described in this documentation, which was scribed in my presence. The recorded information has been reviewed and is accurate.  Rash and nonspecific skin eruption - Plan: fluocinonide cream (LIDEX) 0.05 %  Acute bronchitis - Plan: azithromycin (ZITHROMAX Z-PAK) 250 MG tablet  Pain of left lower leg - Plan: Lower Extremity Venous Duplex Left  Signed, [name], MD

## 2013-10-08 NOTE — Telephone Encounter (Signed)
Patient negative for DVT and superficial clots, she does have a bakers cyst. Patient indicates she was told if negative she needs fluid pill, please advise I will call patient.

## 2013-10-08 NOTE — Telephone Encounter (Signed)
Fluid pill ordered.  Take qam prn leg swelling (usually resolves in 5 days)

## 2013-10-08 NOTE — Patient Instructions (Signed)
Go to [facility] at 2:45 to [facility], use the [place] parking, this is free. You will go to admitting for the ultrasound study, then to the vascular lab.  Driving directions to [facility] [id number]  [phone]  - more info    [place], [place] [address]     1. Head south on [address] toward [address]      0.5 mi    2. Sharp left onto [address]      0.6 mi    3. Turn left onto [address]      0.2 mi    4. Merge [address]      3.0 mi    5. Continue straight to stay on [address]      0.4 mi    6. Slight left to stay on [address]      1.2 mi    7. Turn right onto [address]      0.1 mi    8. Turn left onto [address]      361 ft    9. Take the 1st left onto [address] will be on the right

## 2013-10-08 NOTE — Progress Notes (Signed)
VASCULAR LAB PRELIMINARY  PRELIMINARY  PRELIMINARY  PRELIMINARY  Left lower extremity venous duplex completed.    Preliminary report:  Left leg is negative for deep and superficial vein thrombosis  Luvern Mcisaac, RVT 10/08/2013, 4:48 PM

## 2013-10-09 NOTE — Telephone Encounter (Signed)
Lm for pt with information.

## 2013-10-12 ENCOUNTER — Telehealth: Payer: Self-pay

## 2013-10-12 NOTE — Telephone Encounter (Signed)
Patient wants a b-12 injection.  Wants to know if she needs an appointment, she would perfer to have one  [phone]

## 2013-10-13 ENCOUNTER — Ambulatory Visit (INDEPENDENT_AMBULATORY_CARE_PROVIDER_SITE_OTHER): Payer: Medicare Other | Admitting: Family Medicine

## 2013-10-13 ENCOUNTER — Ambulatory Visit: Payer: Medicare Other

## 2013-10-13 ENCOUNTER — Telehealth: Payer: Self-pay

## 2013-10-13 VITALS — BP 168/92 | HR 77 | Temp 97.4°F | Resp 16 | Ht 64.0 in | Wt 211.0 lb

## 2013-10-13 DIAGNOSIS — R8281 Pyuria: Secondary | ICD-10-CM

## 2013-10-13 DIAGNOSIS — R42 Dizziness and giddiness: Secondary | ICD-10-CM

## 2013-10-13 DIAGNOSIS — E538 Deficiency of other specified B group vitamins: Secondary | ICD-10-CM

## 2013-10-13 DIAGNOSIS — R110 Nausea: Secondary | ICD-10-CM

## 2013-10-13 LAB — POCT CBC
Granulocyte percent: 55.2 %G (ref 37–80)
HCT, POC: 41.8 % (ref 37.7–47.9)
Hemoglobin: 13.2 g/dL (ref 12.2–16.2)
Lymph, poc: 2.5 (ref 0.6–3.4)
MCH, POC: 27.4 pg (ref 27–31.2)
MCHC: 31.6 g/dL — AB (ref 31.8–35.4)
MCV: 86.7 fL (ref 80–97)
MID (cbc): 0.5 (ref 0–0.9)
MPV: 9.4 fL (ref 0–99.8)
POC Granulocyte: 3.7 (ref 2–6.9)
POC LYMPH PERCENT: 37.5 %L (ref 10–50)
POC MID %: 7.3 %M (ref 0–12)
Platelet Count, POC: 241 10*3/uL (ref 142–424)
RBC: 4.82 M/uL (ref 4.04–5.48)
RDW, POC: 15.9 %
WBC: 6.7 10*3/uL (ref 4.6–10.2)

## 2013-10-13 LAB — POCT URINALYSIS DIPSTICK
Bilirubin, UA: NEGATIVE
Glucose, UA: NEGATIVE
Ketones, UA: NEGATIVE
Nitrite, UA: NEGATIVE
Protein, UA: NEGATIVE
Spec Grav, UA: 1.025
Urobilinogen, UA: 0.2
pH, UA: 5.5

## 2013-10-13 LAB — POCT UA - MICROSCOPIC ONLY
Casts, Ur, LPF, POC: NEGATIVE
Crystals, Ur, HPF, POC: NEGATIVE
Mucus, UA: POSITIVE
Yeast, UA: NEGATIVE

## 2013-10-13 MED ORDER — CYANOCOBALAMIN 1000 MCG/ML IJ SOLN
1000.0000 ug | Freq: Once | INTRAMUSCULAR | Status: AC
  Administered 2013-10-13: 1000 ug via INTRAMUSCULAR

## 2013-10-13 MED ORDER — CYANOCOBALAMIN 1000 MCG/ML IJ SOLN: 1000.0000 ug | INTRAMUSCULAR | Status: DC

## 2013-10-13 MED ORDER — SULFAMETHOXAZOLE-TMP DS 800-160 MG PO TABS: 1.0000 | ORAL_TABLET | Freq: Two times a day (BID) | ORAL | Status: DC

## 2013-10-13 NOTE — Telephone Encounter (Signed)
I could see Ellen Johnson today at 3 and I will write an order for a B12 shot today if she would prefer to just walk in.

## 2013-10-13 NOTE — Progress Notes (Signed)
Subjective:  This chart was scribed for [name], MD by Donato Schultz, Medical Scribe. This patient was seen in Room 9 and the patient's care was started at 8:14 PM.   Patient ID: Ellen Johnson, female    DOB: 1936/04/14, 78 y.o.   MRN: [id number]  HPI HPI Comments: Ellen Johnson is a 78 y.o. female who presents to the [facility] complaining of constant dizziness and nausea that started three days ago.  The patient states that standing up aggravates her dizziness.  The patient lists cough and SOB as associated symptoms.  She denies vomiting, diarrhea, polyuria, and chest pain as associated symptoms.  She states that she ate normally today.  The patient's BP was 168/92.    History reviewed. No pertinent past medical history. Past Surgical History  Procedure Laterality Date   Abdominal hysterectomy     Gall bladder removed     Fracture surgery     Family History  Problem Relation Age of Onset   Stroke Mother    Cancer Father    Cancer Maternal Grandmother    History   Social History   Marital Status: Widowed    Spouse Name: N/A    Number of Children: N/A   Years of Education: N/A   Occupational History   Not on file.   Social History Main Topics   Smoking status: Never Smoker    Smokeless tobacco: Not on file   Alcohol Use: Yes   Drug Use: Not on file   Sexual Activity: Not on file   Other Topics Concern   Not on file   Social History Narrative   No narrative on file   Allergies  Allergen Reactions   Valium [Diazepam] Other (See Comments)    Totally different personality   Review of Systems  Respiratory: Positive for cough and shortness of breath.   Cardiovascular: Negative for chest pain.  Gastrointestinal: Positive for nausea. Negative for vomiting and diarrhea.  Endocrine: Negative for polyuria.  Neurological: Positive for dizziness.  All other systems reviewed and are negative.     Objective:    Physical Exam  Nursing note and vitals reviewed. Constitutional: She is oriented to person, place, and time. She appears well-developed and well-nourished. No distress.  HENT:  Head: Normocephalic and atraumatic.  Eyes: EOM are normal.  Neck: Neck supple.  Cardiovascular: Normal rate.   Musculoskeletal: Normal range of motion.  Neurological: She is alert and oriented to person, place, and time.  Skin: Skin is warm and dry.  Psychiatric: She has a normal mood and affect. Her behavior is normal.   Results for orders placed in visit on 10/13/13  POCT CBC      Result Value Range   WBC 6.7  4.6 - 10.2 K/uL   Lymph, poc 2.5  0.6 - 3.4   POC LYMPH PERCENT 37.5  10 - 50 %L   MID (cbc) 0.5  0 - 0.9   POC MID % 7.3  0 - 12 %M   POC Granulocyte 3.7  2 - 6.9   Granulocyte percent 55.2  37 - 80 %G   RBC 4.82  4.04 - 5.48 M/uL   Hemoglobin 13.2  12.2 - 16.2 g/dL   HCT, POC 41.8  37.7 - 47.9 %   MCV 86.7  80 - 97 fL   MCH, POC 27.4  27 - 31.2 pg   MCHC 31.6 (*) 31.8 - 35.4 g/dL   RDW, POC 15.9  Platelet Count, POC 241  142 - 424 K/uL   MPV 9.4  0 - 99.8 fL  POCT URINALYSIS DIPSTICK      Result Value Range   Color, UA yellow     Clarity, UA clear     Glucose, UA neg     Bilirubin, UA neg     Ketones, UA neg     Spec Grav, UA 1.025     Blood, UA small     pH, UA 5.5     Protein, UA neg     Urobilinogen, UA 0.2     Nitrite, UA neg     Leukocytes, UA small (1+)    POCT UA - MICROSCOPIC ONLY      Result Value Range   WBC, Ur, HPF, POC tntc     RBC, urine, microscopic 0-3     Bacteria, U Microscopic 2+     Mucus, UA pos     Epithelial cells, urine per micros 0-1     Crystals, Ur, HPF, POC neg     Casts, Ur, LPF, POC neg     Yeast, UA neg      EKG:  normal   BP 168/92   Pulse 77   Temp(Src) 97.4 F (36.3 C) (Oral)   Resp 16   Ht 5\' 4"  (1.626 m)   Wt 211 lb (95.709 kg)   BMI 36.20 kg/m2   SpO2 95% Assessment & Plan:    I personally performed the services described in  this documentation, which was scribed in my presence. The recorded information has been reviewed and is accurate. Dizziness - Plan: POCT CBC, POCT urinalysis dipstick, POCT UA - Microscopic Only, Comprehensive metabolic panel, EKG 74-BULA, DG Chest 2 View  Nausea alone - Plan: POCT CBC, POCT urinalysis dipstick, POCT UA - Microscopic Only, Comprehensive metabolic panel, EKG 45-XMIW, DG Chest 2 View  Pyuria - Plan: sulfamethoxazole-trimethoprim (BACTRIM DS) 800-160 MG per tablet, Urine culture  Signed, [name], MD

## 2013-10-13 NOTE — Telephone Encounter (Signed)
Patient called she is not feeling any better.  Advised patient to return to clinic, patient states understanding and will return to clinic today.

## 2013-10-13 NOTE — Telephone Encounter (Signed)
LMOM letting [name] know she could come in at 3pm today and get a Rx for her B12 .

## 2013-10-14 LAB — COMPREHENSIVE METABOLIC PANEL
ALT: 25 U/L (ref 0–35)
AST: 29 U/L (ref 0–37)
Albumin: 4.2 g/dL (ref 3.5–5.2)
Alkaline Phosphatase: 95 U/L (ref 39–117)
BUN: 12 mg/dL (ref 6–23)
CO2: 26 mEq/L (ref 19–32)
Calcium: 10 mg/dL (ref 8.4–10.5)
Chloride: 104 mEq/L (ref 96–112)
Creat: 0.77 mg/dL (ref 0.50–1.10)
Glucose, Bld: 98 mg/dL (ref 70–99)
Potassium: 4.1 mEq/L (ref 3.5–5.3)
Sodium: 139 mEq/L (ref 135–145)
Total Bilirubin: 0.3 mg/dL (ref 0.2–1.2)
Total Protein: 6.8 g/dL (ref 6.0–8.3)

## 2013-10-16 LAB — URINE CULTURE: Colony Count: >=100000

## 2013-10-18 ENCOUNTER — Telehealth: Payer: Self-pay

## 2013-10-18 NOTE — Telephone Encounter (Signed)
Patient returning lab call from a few days ago. Cb# [phone]

## 2013-10-19 NOTE — Telephone Encounter (Signed)
LMOM to return call. I see where our office spoke with pt regarding labs on 10/16/13. May have additional questions.

## 2013-10-20 NOTE — Telephone Encounter (Signed)
Pt says she was never notified about her labs. Spoke with her and let her know what they said

## 2013-10-20 NOTE — Telephone Encounter (Signed)
LMOM to return call.

## 2013-12-10 ENCOUNTER — Encounter: Payer: Self-pay | Admitting: Family Medicine

## 2013-12-10 ENCOUNTER — Ambulatory Visit (INDEPENDENT_AMBULATORY_CARE_PROVIDER_SITE_OTHER): Payer: Medicare Other | Admitting: Family Medicine

## 2013-12-10 VITALS — BP 138/66 | HR 100 | Resp 16 | Ht 64.0 in | Wt 213.0 lb

## 2013-12-10 DIAGNOSIS — E538 Deficiency of other specified B group vitamins: Secondary | ICD-10-CM

## 2013-12-10 DIAGNOSIS — R21 Rash and other nonspecific skin eruption: Secondary | ICD-10-CM

## 2013-12-10 DIAGNOSIS — R110 Nausea: Secondary | ICD-10-CM

## 2013-12-10 DIAGNOSIS — R600 Localized edema: Secondary | ICD-10-CM

## 2013-12-10 DIAGNOSIS — R609 Edema, unspecified: Secondary | ICD-10-CM

## 2013-12-10 LAB — CBC
HCT: 39 % (ref 36.0–46.0)
Hemoglobin: 13.3 g/dL (ref 12.0–15.0)
MCH: 27.7 pg (ref 26.0–34.0)
MCHC: 34.1 g/dL (ref 30.0–36.0)
MCV: 81.1 fL (ref 78.0–100.0)
Platelets: 246 10*3/uL (ref 150–400)
RBC: 4.81 MIL/uL (ref 3.87–5.11)
RDW: 15.4 % (ref 11.5–15.5)
WBC: 6 10*3/uL (ref 4.0–10.5)

## 2013-12-10 LAB — SEDIMENTATION RATE: Sed Rate: 10 mm/hr (ref 0–22)

## 2013-12-10 MED ORDER — CYANOCOBALAMIN 1000 MCG/ML IJ SOLN
1000.0000 ug | Freq: Once | INTRAMUSCULAR | Status: AC
  Administered 2013-12-10: 1000 ug via INTRAMUSCULAR

## 2013-12-10 MED ORDER — DOXYCYCLINE HYCLATE 100 MG PO TABS: 100.0000 mg | ORAL_TABLET | Freq: Two times a day (BID) | ORAL | Status: DC

## 2013-12-10 MED ORDER — CYANOCOBALAMIN 1000 MCG/ML IJ SOLN: 1000.0000 ug | Freq: Once | INTRAMUSCULAR | Status: DC

## 2013-12-10 NOTE — Progress Notes (Signed)
Subjective:  This chart was scribed for Ellen Haber, MD by Donato Schultz, Medical Scribe. This patient was seen in Room 27 and the patient's care was started at 12:07 PM.   Patient ID: [name], female    DOB: 1936/02/19, 78 y.o.   MRN: [id number]  HPI HPI Comments: NECIA KAMM is a 78 y.o. female who presents to the [facility] complaining of a constant nausea that started last week after the patient ate breakfast.  She states that now she will experience nausea every time after she eats.  She denies any urinary symptoms.  The patient states that she will either be constipated or experience diarrhea.  She states that her last colonoscopy was 4 years ago.  The patient states that she has had an endoscopy in the past.  The patient states that she last saw her gastroenterologist a month ago for her symptoms and was prescribed nausea medication.  She states that she asked her physician for a referral to have a colonoscopy performed but states that he keeps putting it off.  She states that the medication is not helping her symptoms and she would like an MRI of her body.  She states that she was prescribed Omeprazole 6 months ago.  She states that she does not eat fried foods and will eat hamburgers intermittently.  She states that she does not eat pork but loves vegetables.   The patient is also complaining of a swollen, erythematous area on her left leg.  She states that when the area is palpated she feels like "hot and cold water" is running down to her left foot.  She states that she is still taking her fluid pills.  She states that she will use the bathroom several hours after taking her fluid pill at night but will go every two hours during the day.   The patient states that she would like a Vitamin B shot.   No past medical history on file. Past Surgical History  Procedure Laterality Date   Abdominal hysterectomy     Gall bladder removed     Fracture  surgery     Family History  Problem Relation Age of Onset   Stroke Mother    Cancer Father    Cancer Maternal Grandmother    History   Social History   Marital Status: Widowed    Spouse Name: N/A    Number of Children: N/A   Years of Education: N/A   Occupational History   Not on file.   Social History Main Topics   Smoking status: Never Smoker    Smokeless tobacco: Not on file   Alcohol Use: Yes   Drug Use: Not on file   Sexual Activity: Not on file   Other Topics Concern   Not on file   Social History Narrative   No narrative on file   Allergies  Allergen Reactions   Valium [Diazepam] Other (See Comments)    Totally different personality    Review of Systems  Gastrointestinal: Positive for nausea.  Skin: Positive for color change. Negative for rash.  All other systems reviewed and are negative.     Objective:  Physical Exam  Nursing note and vitals reviewed. Constitutional: She is oriented to person, place, and time. She appears well-developed and well-nourished.  HENT:  Head: Normocephalic and atraumatic.  Eyes: EOM are normal.  Neck: Normal range of motion.  Cardiovascular: Normal rate.   Pulmonary/Chest: Effort normal.  Abdominal: Soft.  Musculoskeletal: Normal range of motion.  Neurological: She is alert and oriented to person, place, and time.  Skin: Skin is warm and dry.  Chest diffuse erythema overlying the pedal edema on the left leg. This appears to be warm and typical A. early cellulitis.  Patient also has a raised 1/2 cm in diameter annular erythematous nodule on her right hip and a 1/2 cm erythematous macule area on the left suggestive of early squamous cell skin cancer. We'll schedule a biopsy and removal next week.  Psychiatric: She has a normal mood and affect. Her behavior is normal.     BP 138/66   Pulse 100   Resp 16   Ht 5\' 4"  (1.626 m)   Wt 213 lb (96.616 kg)   BMI 36.54 kg/m2   SpO2 94% Assessment & Plan:    I  personally performed the services described in this documentation, which was scribed in my presence. The recorded information has been reviewed and is accurate.  Nausea alone - Plan: TSH, Comprehensive metabolic panel, Sedimentation Rate, CBC, CT Abdomen Pelvis W Contrast  Rash and nonspecific skin eruption - Plan: TSH, Comprehensive metabolic panel, Sedimentation Rate, CBC, doxycycline (VIBRA-TABS) 100 MG tablet  Pedal edema - Plan: TSH, Comprehensive metabolic panel, Sedimentation Rate, CBC  B12 deficiency - Plan: cyanocobalamin ((VITAMIN B-12)) injection 1,000 mcg, cyanocobalamin ((VITAMIN B-12)) injection 1,000 mcg  Signed, Ellen Haber, MD

## 2013-12-11 LAB — COMPREHENSIVE METABOLIC PANEL
ALT: 15 U/L (ref 0–35)
AST: 17 U/L (ref 0–37)
Albumin: 4.3 g/dL (ref 3.5–5.2)
Alkaline Phosphatase: 89 U/L (ref 39–117)
BUN: 13 mg/dL (ref 6–23)
CO2: 27 mEq/L (ref 19–32)
Calcium: 9.6 mg/dL (ref 8.4–10.5)
Chloride: 103 mEq/L (ref 96–112)
Creat: 0.69 mg/dL (ref 0.50–1.10)
Glucose, Bld: 118 mg/dL — ABNORMAL HIGH (ref 70–99)
Potassium: 4.4 mEq/L (ref 3.5–5.3)
Sodium: 137 mEq/L (ref 135–145)
Total Bilirubin: 0.5 mg/dL (ref 0.2–1.2)
Total Protein: 6.6 g/dL (ref 6.0–8.3)

## 2013-12-11 LAB — TSH: TSH: 2.809 u[IU]/mL (ref 0.350–4.500)

## 2013-12-16 ENCOUNTER — Telehealth: Payer: Self-pay

## 2013-12-16 NOTE — Telephone Encounter (Signed)
Pt came to 104 asking about her upcoming appointment with dr Joseph Art. States she is supposed to have something removed by him this Friday at 145 and wants to know if she should keep that appointment. 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 Pt has scheduled an appt for the same thing in may in case dr Joseph Art thinks she should wait so she can attend the evenings activities. Pt would like a call back to let her know to keep her Friday appointment or wait until may.   Pt best: [phone] (states a detailed message is ok)  bf

## 2013-12-17 NOTE — Telephone Encounter (Signed)
Left detailed message on machine.

## 2013-12-17 NOTE — Telephone Encounter (Signed)
Please let patient know that her activities will not need to be restricted tomorrow

## 2013-12-18 ENCOUNTER — Encounter: Payer: Self-pay | Admitting: Family Medicine

## 2013-12-18 ENCOUNTER — Ambulatory Visit (INDEPENDENT_AMBULATORY_CARE_PROVIDER_SITE_OTHER): Payer: Medicare Other | Admitting: Family Medicine

## 2013-12-18 ENCOUNTER — Ambulatory Visit: Payer: Medicare Other | Admitting: Family Medicine

## 2013-12-18 VITALS — BP 144/76 | HR 88 | Temp 98.2°F | Resp 18 | Ht 63.0 in | Wt 214.0 lb

## 2013-12-18 DIAGNOSIS — C4490 Unspecified malignant neoplasm of skin, unspecified: Secondary | ICD-10-CM

## 2013-12-18 NOTE — Progress Notes (Signed)
Is a 78 year old woman who presents for removal of a skin lesion on her right hip. He began about a year ago as a small spot and has gradually enlarged and deepened. Is been no bleeding or pain involved.  Objective: Patient has a smooth highly vascular annular lesion on the right trochanteric area.  After informed consent, the area was prepped thoroughly with Betadine. An elliptical incision around the entire lesion measuring approximately 2 cm was carried out. The entire incision measured 4 cm. After completely excising the lesion, approximately 6 sutures were placed most of which were simple but one of which is in horizontal mattress suture: 4-0 Ethilon. Sterile dressing was applied. There was good hemostasis the patient tolerated the procedure exceedingly well.  Assessment: Most likely this is squamous cell cancer. This was discussed with the patient and she pointed out that she's developing another area on the upper bridge of her noseas well as her left hip.  Plan: Keep bandaged overnight and then just apply simple dressings until recheck in one week. At that time I've asked Amy Littrell to remove one half the sutures and then have patient come back 5 days later for complete removal of remaining stitches.  When I see her in roughly 12 days, we'll discuss what to do about the lesion on her nose and on her left hip.  Signed, Current onset of the

## 2013-12-21 ENCOUNTER — Telehealth: Payer: Self-pay

## 2013-12-21 NOTE — Telephone Encounter (Signed)
Pt awaiting CT scan to be scheduled. Can we look into this please. Thanks

## 2013-12-23 ENCOUNTER — Ambulatory Visit (INDEPENDENT_AMBULATORY_CARE_PROVIDER_SITE_OTHER): Payer: Medicare Other | Admitting: Family Medicine

## 2013-12-23 VITALS — BP 126/80 | HR 91 | Temp 97.7°F | Resp 18

## 2013-12-23 DIAGNOSIS — R110 Nausea: Secondary | ICD-10-CM

## 2013-12-23 DIAGNOSIS — Z809 Family history of malignant neoplasm, unspecified: Secondary | ICD-10-CM

## 2013-12-23 DIAGNOSIS — R109 Unspecified abdominal pain: Secondary | ICD-10-CM

## 2013-12-23 NOTE — Progress Notes (Signed)
This chart was scribed for Ellen Haber, MD by Ellen Johnson, Medical Scribe. This patient's care was started at 5:23 PM  Patient ID: Ellen Johnson MRN: [id number], DOB: 1936-04-04, 78 y.o. Date of Encounter: 12/23/2013, 5:23 PM  Primary Physician: Ellen Haber, MD  Chief Complaint: suture removal  HPI: 78 y.o. year old female with history below presents for suture removal. Was seen 12/18/13 for removal of a skin lesion on her right hip. Area determined most likely to be squamous cell cancer.  Pt was advised to return in 5 days for complete removal of stiches.   Pt also states she is distraught due to the wait she has experienced for the past 2 months trying to get a CT scan of the abdomen. Pt states she has been going back and forth between her insurance company and [facility] to get the appointment scheduled. Reports sx of gradually worsening mid abdominal pain and nausea. Says she is nauseous all the time. Movement and BM make pain worse. Pt also has a family hx of colon cancer.   Past Medical History  Diagnosis Date   GERD (gastroesophageal reflux disease)      Home Meds: Prior to Admission medications   Medication Sig Start Date End Date Taking? Authorizing Provider  acetaminophen (TYLENOL) 500 MG tablet Take 500 mg by mouth every 6 (six) hours as needed.   Yes Historical Provider, MD  cholecalciferol (VITAMIN D) 1000 UNITS tablet Take 1,000 Units by mouth daily.   Yes Historical Provider, MD  cyanocobalamin (,VITAMIN B-12,) 1000 MCG/ML injection Inject 1 mL (1,000 mcg total) into the muscle every 30 (thirty) days. 10/13/13  Yes Ellen Haber, MD  doxycycline (VIBRA-TABS) 100 MG tablet Take 1 tablet (100 mg total) by mouth 2 (two) times daily. 12/10/13  Yes Ellen Haber, MD  fish oil-omega-3 fatty acids 1000 MG capsule Take 2 g by mouth daily.   Yes Historical Provider, MD  fluocinonide cream (LIDEX) 0.05 % APPLY TO AFFECTED AREA TWICE A DAY TO RIGHT ANKLE 10/08/13  Yes Ellen Haber, MD  furosemide (LASIX) 20 MG tablet Take 1 tablet (20 mg total) by mouth daily. 10/08/13  Yes Ellen Haber, MD  Multiple Vitamin (MULTIVITAMIN) tablet Take 1 tablet by mouth daily.   Yes Historical Provider, MD  naproxen sodium (ANAPROX DS) 550 MG tablet Take 1 tablet (550 mg total) by mouth 2 (two) times daily with a meal. 02/15/13 02/15/14 Yes Ellison Carwin, MD  omeprazole (PRILOSEC) 40 MG capsule Take 40 mg by mouth daily.   Yes Historical Provider, MD  traMADol (ULTRAM) 50 MG tablet Take 1 tablet (50 mg total) by mouth every 6 (six) hours as needed for pain. 02/15/13  Yes Ellison Carwin, MD    Allergies:  Allergies  Allergen Reactions   Valium [Diazepam] Other (See Comments)    Totally different personality    History   Social History   Marital Status: Widowed    Spouse Name: N/A    Number of Children: N/A   Years of Education: N/A   Occupational History   Not on file.   Social History Main Topics   Smoking status: Never Smoker    Smokeless tobacco: Not on file   Alcohol Use: Yes   Drug Use: Not on file   Sexual Activity: Not on file   Other Topics Concern   Not on file   Social History Narrative   No narrative on file    Review of Systems: Constitutional: negative for chills, fever, night sweats,  weight changes, or fatigue  HEENT: negative for vision changes, hearing loss, congestion, rhinorrhea, ST, epistaxis, or sinus pressure Cardiovascular: negative for chest pain or palpitations Respiratory: negative for hemoptysis, wheezing, shortness of breath, or cough Abdominal: negative for vomiting, diarrhea, or constipation. Positive for nausea and abdominal pain. Dermatological: negative for rash Neurologic: negative for headache, dizziness, or syncope All other systems reviewed and are otherwise negative with the exception to those above and in the HPI.  Physical Exam: Blood pressure 126/80, pulse 91, temperature 97.7 F (36.5 C),  temperature source Oral, resp. rate 18, SpO2 94.00%., There is no weight on file to calculate BMI. General: Well developed, well nourished, in no acute distress. Head: Normocephalic, atraumatic, eyes without discharge, sclera non-icteric, nares are without discharge. Bilateral auditory canals clear, TM's are without perforation, pearly grey and translucent with reflective cone of light bilaterally. Oral cavity moist, posterior pharynx without exudate, erythema, peritonsillar abscess, or post nasal drip.  Neck: Supple. No thyromegaly. Full ROM. No lymphadenopathy. Lungs: Clear bilaterally to auscultation without wheezes, rales, or rhonchi. Breathing is unlabored. Heart: RRR with S1 S2. No murmurs, rubs, or gallops appreciated. Abdomen: Soft, left lower quadrant-tender, non-distended with normoactive bowel sounds. No hepatomegaly. No rebound/guarding. Msk:  Strength and tone normal for age. Extremities/Skin: Warm and dry. No clubbing or cyanosis. No edema. No rashes or suspicious lesions. Wound on right hip is still open a dry and beginning to heal of TMs. Neuro: Alert and oriented X 3. Moves all extremities spontaneously. Gait is normal. CNII-XII grossly in tact. Psych:  Responds to questions appropriately with a normal affect.   Labs:   ASSESSMENT AND PLAN:  77 y.o. year old female with probable left lower quadrant diverticulosis. She needs to have a CAT scan to rule out diverticulitis. I've ordered this again and hope to have it done before the weekend.  I delay taking her sutures until next Tuesday when patient will return for followup.   Signed, Ellen Haber, MD 12/23/2013 5:25 PM

## 2013-12-25 ENCOUNTER — Encounter: Payer: Self-pay | Admitting: *Deleted

## 2013-12-25 ENCOUNTER — Other Ambulatory Visit: Payer: Self-pay | Admitting: *Deleted

## 2013-12-25 DIAGNOSIS — K5732 Diverticulitis of large intestine without perforation or abscess without bleeding: Secondary | ICD-10-CM

## 2013-12-25 NOTE — Progress Notes (Unsigned)
LM for pt to rtn call. We have put in order to have her see GI. We are unable to have the CT approved until she sees a GI specialist.

## 2013-12-26 NOTE — Progress Notes (Signed)
lmom to call back 

## 2013-12-28 NOTE — Progress Notes (Signed)
LMVM for pt to CB. 

## 2013-12-28 NOTE — Progress Notes (Signed)
Spoke with Susette Racer. She has already discussed this with patient. Needs further work from insurance side.  Butch Penny is working on this.

## 2013-12-29 ENCOUNTER — Ambulatory Visit (INDEPENDENT_AMBULATORY_CARE_PROVIDER_SITE_OTHER): Payer: Medicare Other | Admitting: Physician Assistant

## 2013-12-29 VITALS — BP 134/78 | HR 84 | Temp 98.3°F | Resp 16 | Wt 213.0 lb

## 2013-12-29 DIAGNOSIS — Z4802 Encounter for removal of sutures: Secondary | ICD-10-CM

## 2013-12-30 ENCOUNTER — Telehealth: Payer: Self-pay | Admitting: *Deleted

## 2013-12-30 NOTE — Telephone Encounter (Signed)
Referral for CT has been approved. Pt has been notified. Appt with [facility] is scheduled for April 28 at 3pm. Pt also has been scheduled for an appt with GI- Dr. Ulyses Amor office April 27 1:15pm. Pt has been advised of both appt. She will call after the CT scan to find out the best possible time to come in for follow up with [name].

## 2013-12-30 NOTE — Progress Notes (Signed)
   Subjective:    Patient ID: Ellen Johnson, female    DOB: Dec 24, 1935, 78 y.o.   MRN: [id number]  Suture / Staple Removal     Ellen Johnson is a very pleasant 78 yr old female here for suture removal.  Sutures were placed here 12/18/13 after a biopsy.  Pt returned 12/23/13 for suture removal - but were not ready to be taken out.  She returns today for removal.  She has no pain or drainage from the area.    Review of Systems  Constitutional: Negative.   Respiratory: Negative.   Cardiovascular: Negative.   Skin: Positive for wound.       Objective:   Physical Exam  Vitals reviewed. Constitutional: She is oriented to person, place, and time. She appears well-developed and well-nourished. No distress.  HENT:  Head: Normocephalic and atraumatic.  Pulmonary/Chest: Effort normal.  Neurological: She is alert and oriented to person, place, and time.  Skin: Skin is warm and dry.     Healing incision at RIGHT leg; edges well approximated; sutures removed without difficulty  Psychiatric: She has a normal mood and affect. Her behavior is normal.       Assessment & Plan:  Visit for suture removal   Ellen Johnson is a very pleasant 78 yr old female here for suture removal after skin biopsy.  Wound well healed.  Sutures removed without difficulty.  RTC as needed     E. Natividad Brood MHS, PA-C [facility] 4/22/20158:37 AM

## 2014-01-01 ENCOUNTER — Other Ambulatory Visit: Payer: Medicare Other

## 2014-01-05 ENCOUNTER — Ambulatory Visit
Admission: RE | Admit: 2014-01-05 | Discharge: 2014-01-05 | Disposition: A | Discharge status: Home / Self Care | Payer: Medicare Other | Source: Ambulatory Visit | Attending: Family Medicine | Admitting: Family Medicine

## 2014-01-05 DIAGNOSIS — R109 Unspecified abdominal pain: Secondary | ICD-10-CM

## 2014-01-05 DIAGNOSIS — R110 Nausea: Secondary | ICD-10-CM

## 2014-01-05 DIAGNOSIS — Z809 Family history of malignant neoplasm, unspecified: Secondary | ICD-10-CM

## 2014-01-05 MED ORDER — IOHEXOL 300 MG/ML  SOLN
125.0000 mL | Freq: Once | INTRAMUSCULAR | Status: AC | PRN
  Administered 2014-01-05: 125 mL via INTRAVENOUS

## 2014-01-05 MED ORDER — IOHEXOL 300 MG/ML  SOLN
30.0000 mL | Freq: Once | INTRAMUSCULAR | Status: AC | PRN
  Administered 2014-01-05: 30 mL via ORAL

## 2014-01-06 ENCOUNTER — Telehealth: Payer: Self-pay | Admitting: *Deleted

## 2014-01-06 DIAGNOSIS — Z1239 Encounter for other screening for malignant neoplasm of breast: Secondary | ICD-10-CM

## 2014-01-06 NOTE — Telephone Encounter (Signed)
Pt is requesting a mammogram on her left breast for screening. She has located a lump on her left breast. She has had 9 lumps removed and is worried that this.  Pt would like you to call her with her CT results.  Pended mammogram ordered.

## 2014-01-07 ENCOUNTER — Other Ambulatory Visit: Payer: Self-pay | Admitting: Radiology

## 2014-01-07 ENCOUNTER — Other Ambulatory Visit: Payer: Self-pay | Admitting: Family Medicine

## 2014-01-07 DIAGNOSIS — Z0000 Encounter for general adult medical examination without abnormal findings: Secondary | ICD-10-CM

## 2014-01-07 DIAGNOSIS — Z1239 Encounter for other screening for malignant neoplasm of breast: Secondary | ICD-10-CM

## 2014-01-12 ENCOUNTER — Other Ambulatory Visit: Payer: Self-pay | Admitting: Family Medicine

## 2014-01-12 DIAGNOSIS — N6320 Unspecified lump in the left breast, unspecified quadrant: Secondary | ICD-10-CM

## 2014-01-14 ENCOUNTER — Other Ambulatory Visit: Payer: Self-pay | Admitting: Family Medicine

## 2014-01-14 DIAGNOSIS — N6320 Unspecified lump in the left breast, unspecified quadrant: Secondary | ICD-10-CM

## 2014-01-20 ENCOUNTER — Ambulatory Visit (INDEPENDENT_AMBULATORY_CARE_PROVIDER_SITE_OTHER): Payer: Medicare Other | Admitting: Family Medicine

## 2014-01-20 ENCOUNTER — Ambulatory Visit (HOSPITAL_COMMUNITY)
Admission: RE | Admit: 2014-01-20 | Discharge: 2014-01-20 | Disposition: A | Discharge status: Home / Self Care | Payer: Medicare Other | Source: Ambulatory Visit | Attending: Family Medicine | Admitting: Family Medicine

## 2014-01-20 VITALS — BP 116/61 | HR 96 | Temp 99.4°F | Resp 24 | Ht 65.0 in | Wt 216.0 lb

## 2014-01-20 DIAGNOSIS — L0390 Cellulitis, unspecified: Secondary | ICD-10-CM

## 2014-01-20 DIAGNOSIS — L0291 Cutaneous abscess, unspecified: Secondary | ICD-10-CM

## 2014-01-20 DIAGNOSIS — M79609 Pain in unspecified limb: Secondary | ICD-10-CM

## 2014-01-20 DIAGNOSIS — M7989 Other specified soft tissue disorders: Secondary | ICD-10-CM

## 2014-01-20 MED ORDER — TRIAMCINOLONE ACETONIDE 0.1 % EX CREA: 1.0000 "application " | TOPICAL_CREAM | Freq: Two times a day (BID) | CUTANEOUS | Status: DC

## 2014-01-20 MED ORDER — CEPHALEXIN 500 MG PO CAPS: 500.0000 mg | ORAL_CAPSULE | Freq: Four times a day (QID) | ORAL | Status: DC

## 2014-01-20 NOTE — Progress Notes (Addendum)
*  PRELIMINARY RESULTS* Vascular Ultrasound Left lower extremity venous duplex has been completed.  Preliminary findings: No obvious evidence of DVT noted. Calf veins are poorly visualized due to edema, but no obvious DVT. Baker's cyst on the left, consistent with previous study 10/08/13.   Called report to [facility].   Landry Mellow, RDMS, RVT  01/20/2014, 5:23 PM

## 2014-01-20 NOTE — Patient Instructions (Addendum)
YOU ARE GOING TO GO TO [facility] TO HAVE AN ULTRASOUND DONE ON YOUR LEG. YOU ARE TO GO TO [facility] [place] [place]. THEY WILL REGISTER YOU AND THEN SEND YOU TO THE VASCULAR LAB. WE WILL CALL YOU AND LET YOU KNOW WHAT IT SHOWS AFTER IT IS DONE.    Cellulitis Cellulitis is an infection of the skin and the tissue beneath it. The infected area is usually red and tender. Cellulitis occurs most often in the arms and lower legs.  CAUSES  Cellulitis is caused by bacteria that enter the skin through cracks or cuts in the skin. The most common types of bacteria that cause cellulitis are Staphylococcus and Streptococcus. SYMPTOMS   Redness and warmth.  Swelling.  Tenderness or pain.  Fever. DIAGNOSIS  Your caregiver can usually determine what is wrong based on a physical exam. Blood tests may also be done. TREATMENT  Treatment usually involves taking an antibiotic medicine. HOME CARE INSTRUCTIONS   Take your antibiotics as directed. Finish them even if you start to feel better.  Keep the infected arm or leg elevated to reduce swelling.  Apply a warm cloth to the affected area up to 4 times per day to relieve pain.  Only take over-the-counter or prescription medicines for pain, discomfort, or fever as directed by your caregiver.  Keep all follow-up appointments as directed by your caregiver. SEEK MEDICAL CARE IF:   You notice red streaks coming from the infected area.  Your red area gets larger or turns dark in color.  Your bone or joint underneath the infected area becomes painful after the skin has healed.  Your infection returns in the same area or another area.  You notice a swollen bump in the infected area.  You develop new symptoms. SEEK IMMEDIATE MEDICAL CARE IF:   You have a fever.  You feel very sleepy.  You develop vomiting or diarrhea.  You have a general ill feeling (malaise) with muscle aches and pains. MAKE SURE YOU:   Understand these  instructions.  Will watch your condition.  Will get help right away if you are not doing well or get worse. Document Released: 06/06/2005 Document Revised: 02/26/2012 Document Reviewed: 11/12/2011 [facility] Patient Information 2014 [place].

## 2014-01-20 NOTE — Progress Notes (Signed)
78 yo widowed woman with 5 months of swelling and redness in left leg.  She has been applying OTC antibiotic cream and benadryl cream. The area of pain and swelling involves both side of her lower calf  Objective:  NAD Left calf is swollen and tender with bilateral red, indurated patches of cellulitis.  Assessment:  The chronicity of this is worrisome.  The Lasix still effecting a diuresis.  Plan: Cellulitis - Plan: cephALEXin (KEFLEX) 500 MG capsule, triamcinolone cream (KENALOG) 0.1 %, Lower Extremity Venous Duplex Left  Swelling of calf - Plan: Lower Extremity Venous Duplex Left  Signed, [name], MD

## 2014-01-21 ENCOUNTER — Ambulatory Visit: Payer: Medicare Other | Admitting: Family Medicine

## 2014-02-01 ENCOUNTER — Telehealth: Payer: Self-pay | Admitting: Family Medicine

## 2014-02-01 ENCOUNTER — Other Ambulatory Visit: Payer: Self-pay | Admitting: Family Medicine

## 2014-02-01 NOTE — Telephone Encounter (Signed)
Patient would like a refill on her Keflex and Kenalog cream  [phone]

## 2014-02-02 NOTE — Telephone Encounter (Signed)
Dr L, do you want to RF or does pt need re-eval?

## 2014-02-02 NOTE — Telephone Encounter (Signed)
Notified pt on VM that she needs to RTC for re-check if not resolved.

## 2014-02-04 ENCOUNTER — Ambulatory Visit: Payer: Medicare Other | Admitting: Family Medicine

## 2014-02-17 ENCOUNTER — Ambulatory Visit (INDEPENDENT_AMBULATORY_CARE_PROVIDER_SITE_OTHER): Payer: Medicare Other | Admitting: Family Medicine

## 2014-02-17 VITALS — BP 138/70 | HR 76 | Temp 98.5°F | Ht 63.0 in | Wt 216.0 lb

## 2014-02-17 DIAGNOSIS — L03116 Cellulitis of left lower limb: Secondary | ICD-10-CM

## 2014-02-17 DIAGNOSIS — R609 Edema, unspecified: Secondary | ICD-10-CM

## 2014-02-17 DIAGNOSIS — L02419 Cutaneous abscess of limb, unspecified: Secondary | ICD-10-CM

## 2014-02-17 DIAGNOSIS — R600 Localized edema: Secondary | ICD-10-CM

## 2014-02-17 DIAGNOSIS — L03119 Cellulitis of unspecified part of limb: Secondary | ICD-10-CM

## 2014-02-17 DIAGNOSIS — R21 Rash and other nonspecific skin eruption: Secondary | ICD-10-CM

## 2014-02-17 NOTE — Progress Notes (Signed)
This chart was scribed for [name], MD by Einar Pheasant, ED Scribe. This patient was seen in room 4 and the patient's care was started at 6:29 PM.  Patient ID: Ellen Johnson MRN: [id number], DOB: 18-Sep-1935, 78 y.o. Date of Encounter: 02/17/2014, 6:03 PM  Primary Physician: [name], MD  Chief Complaint:  Chief Complaint  Patient presents with   Follow-up    cellulitis     HPI: 78 y.o. year old female with history below presents to the office for a follow up on her cellulitis. Pt has been suffering with cellulitis to her left leg for the past 5 months with associated swelling. Pt states that she just recently finished taking her prescribed antibiotics. She states that she's been trying to lose weight so she's been walking a lot. Pt is concerned that the increased walking may be causing her cellulitis to flare up. Pt was advised that the walking is not causing her cellulitis to flare up. She also states that in the past she was given an experimental drug shot in her left knee. Pt states that the shot was given to her about 5 months ago. She is also concerned that the shot may have caused her cellulitis to flare up. Advised pt that we will obtain a skin biopsy, pt agrees with the course of treatment.   Pt states that she has been seen by 2 dermatologists in the past, but neither or them could diagnose the cause of her cellulitis. She states that when she applies pressure on her leg she feels cold water running down her leg. She denies any other pertinent medical history.    Past Medical History  Diagnosis Date   GERD (gastroesophageal reflux disease)      Home Meds: Prior to Admission medications   Medication Sig Start Date End Date Taking? Authorizing Provider  acetaminophen (TYLENOL) 500 MG tablet Take 500 mg by mouth every 6 (six) hours as needed.   Yes Historical Provider, MD  cholecalciferol (VITAMIN D) 1000 UNITS tablet Take 1,000 Units by mouth daily.   Yes  Historical Provider, MD  fish oil-omega-3 fatty acids 1000 MG capsule Take 2 g by mouth daily.   Yes Historical Provider, MD  furosemide (LASIX) 20 MG tablet Take 1 tablet (20 mg total) by mouth daily. 10/08/13  Yes [name], MD  Multiple Vitamin (MULTIVITAMIN) tablet Take 1 tablet by mouth daily.   Yes Historical Provider, MD  omeprazole (PRILOSEC) 40 MG capsule Take 40 mg by mouth daily.   Yes Historical Provider, MD  traMADol (ULTRAM) 50 MG tablet Take 1 tablet (50 mg total) by mouth every 6 (six) hours as needed for pain. 02/15/13  Yes Ellison Carwin, MD  triamcinolone cream (KENALOG) 0.1 % APPLY 1 APPLICATION TOPICALLY 2 (TWO) TIMES DAILY.   Yes [name], MD    Allergies:  Allergies  Allergen Reactions   Valium [Diazepam] Other (See Comments)    Totally different personality    History   Social History   Marital Status: Widowed    Spouse Name: N/A    Number of Children: N/A   Years of Education: N/A   Occupational History   Not on file.   Social History Main Topics   Smoking status: Never Smoker    Smokeless tobacco: Never Used   Alcohol Use: Yes   Drug Use: Not on file   Sexual Activity: Not on file   Other Topics Concern   Not on file   Social History Narrative  No narrative on file     Review of Systems: positive for leg swelling  Constitutional: negative for chills, fever, night sweats, weight changes, or fatigue  HEENT: negative for vision changes, hearing loss, congestion, rhinorrhea, ST, epistaxis, or sinus pressure Cardiovascular: negative for chest pain or palpitations Respiratory: negative for hemoptysis, wheezing, shortness of breath, or cough Abdominal: negative for abdominal pain, nausea, vomiting, diarrhea, or constipation Dermatological: negative for rash Neurologic: negative for headache, dizziness, or syncope All other systems reviewed and are otherwise negative with the exception to those above and in the  HPI.   Physical Exam: pt has 5 x 10 cm indurated scaling erythematous area on her left outer leg. Pt has 1+ pitting edema in the pretibial area.  Blood pressure 138/70, pulse 76, temperature 98.5 F (36.9 C), temperature source Oral, height 5\' 3"  (1.6 m), weight 216 lb (97.977 kg), SpO2 95.00%., Body mass index is 38.27 kg/(m^2). General: Well developed, well nourished, in no acute distress. Head: Normocephalic, atraumatic, eyes without discharge, sclera non-icteric, nares are without discharge. Bilateral auditory canals clear, TM's are without perforation, pearly grey and translucent with reflective cone of light bilaterally. Oral cavity moist, posterior pharynx without exudate, erythema, peritonsillar abscess, or post nasal drip.  Neck: Supple. No thyromegaly. Full ROM. No lymphadenopathy. Lungs: Clear bilaterally to auscultation without wheezes, rales, or rhonchi. Breathing is unlabored. Heart: RRR with S1 S2. No murmurs, rubs, or gallops appreciated. Abdomen: Soft, non-tender, non-distended with normoactive bowel sounds. No hepatomegaly. No rebound/guarding. No obvious abdominal masses. Msk:  Strength and tone normal for age. Extremities/Skin: Warm and dry. No clubbing or cyanosis. No edema. No rashes or suspicious lesions. Neuro: Alert and oriented X 3. Moves all extremities spontaneously. Gait is normal. CNII-XII grossly in tact. Psych:  Responds to questions appropriately with a normal affect.   1% Xylocaine with epi used to infiltrate the skin in the left lateral calf and a 2 mm biopsy was taken after betadine prep. No complications and a sterile dressing was applied.  ASSESSMENT AND PLAN:  78 y.o. year old female with unusual rash which may be an early form of skin cancer. It's also also but this is a fixed drug reaction.  I personally performed the services described in this documentation, which was scribed in my presence. The recorded information has been reviewed and is  accurate.  Signed, [name], MD 02/17/2014 6:03 PM

## 2014-02-18 ENCOUNTER — Telehealth: Payer: Self-pay

## 2014-02-18 ENCOUNTER — Ambulatory Visit: Payer: Medicare Other | Admitting: Family Medicine

## 2014-02-18 NOTE — Telephone Encounter (Signed)
Pt's daughter Eustaquio Maize called in to speak to Dr Joseph Art about her mother Ellen Johnson, She would like him to call her back asap. Eustaquio Maize can be reached at [phone] Thank you

## 2014-03-04 ENCOUNTER — Ambulatory Visit (HOSPITAL_COMMUNITY): Admit: 2014-03-04 | Payer: Self-pay | Admitting: Gastroenterology

## 2014-03-04 ENCOUNTER — Other Ambulatory Visit: Payer: Self-pay | Admitting: Gastroenterology

## 2014-03-04 ENCOUNTER — Encounter (HOSPITAL_COMMUNITY): Payer: Self-pay

## 2014-03-04 ENCOUNTER — Ambulatory Visit: Payer: Medicare Other | Admitting: Family Medicine

## 2014-03-04 SURGERY — COLONOSCOPY
Anesthesia: Moderate Sedation

## 2014-03-21 ENCOUNTER — Telehealth: Payer: Self-pay

## 2014-03-21 NOTE — Telephone Encounter (Signed)
Patient would like to have her Phenergan filled.  Patient states that she has been throwing up all night.   Best#: [id number]

## 2014-03-22 NOTE — Telephone Encounter (Signed)
LM for PT to RTC for refill

## 2014-03-25 ENCOUNTER — Other Ambulatory Visit: Payer: Medicare Other

## 2014-03-30 LAB — HM COLONOSCOPY

## 2014-04-01 ENCOUNTER — Ambulatory Visit: Payer: Medicare Other | Admitting: Family Medicine

## 2014-04-06 ENCOUNTER — Other Ambulatory Visit: Payer: Medicare Other

## 2014-04-29 ENCOUNTER — Ambulatory Visit
Admission: RE | Admit: 2014-04-29 | Discharge: 2014-04-29 | Disposition: A | Discharge status: Home / Self Care | Payer: Medicare Other | Source: Ambulatory Visit | Attending: Family Medicine | Admitting: Family Medicine

## 2014-04-29 ENCOUNTER — Encounter (INDEPENDENT_AMBULATORY_CARE_PROVIDER_SITE_OTHER): Payer: Self-pay

## 2014-04-29 ENCOUNTER — Ambulatory Visit: Payer: Medicare Other | Admitting: Family Medicine

## 2014-04-29 DIAGNOSIS — N6320 Unspecified lump in the left breast, unspecified quadrant: Secondary | ICD-10-CM

## 2014-05-11 ENCOUNTER — Encounter: Payer: Self-pay | Admitting: Family Medicine

## 2014-05-27 ENCOUNTER — Encounter: Payer: Self-pay | Admitting: Family Medicine

## 2014-05-27 ENCOUNTER — Ambulatory Visit (INDEPENDENT_AMBULATORY_CARE_PROVIDER_SITE_OTHER): Payer: Medicare Other | Admitting: Family Medicine

## 2014-05-27 VITALS — BP 151/80 | HR 90 | Temp 98.5°F | Resp 16 | Ht 65.0 in | Wt 214.0 lb

## 2014-05-27 DIAGNOSIS — L309 Dermatitis, unspecified: Secondary | ICD-10-CM

## 2014-05-27 DIAGNOSIS — Z23 Encounter for immunization: Secondary | ICD-10-CM

## 2014-05-27 DIAGNOSIS — L259 Unspecified contact dermatitis, unspecified cause: Secondary | ICD-10-CM

## 2014-05-27 DIAGNOSIS — R110 Nausea: Secondary | ICD-10-CM

## 2014-05-27 LAB — CBC WITH DIFFERENTIAL/PLATELET
Basophils Absolute: 0 10*3/uL (ref 0.0–0.1)
Basophils Relative: 0 % (ref 0–1)
Eosinophils Absolute: 0.2 10*3/uL (ref 0.0–0.7)
Eosinophils Relative: 2 % (ref 0–5)
HCT: 39 % (ref 36.0–46.0)
Hemoglobin: 13.3 g/dL (ref 12.0–15.0)
Lymphocytes Relative: 20 % (ref 12–46)
Lymphs Abs: 2 10*3/uL (ref 0.7–4.0)
MCH: 28.3 pg (ref 26.0–34.0)
MCHC: 34.1 g/dL (ref 30.0–36.0)
MCV: 83 fL (ref 78.0–100.0)
Monocytes Absolute: 0.8 10*3/uL (ref 0.1–1.0)
Monocytes Relative: 8 % (ref 3–12)
Neutro Abs: 7 10*3/uL (ref 1.7–7.7)
Neutrophils Relative %: 70 % (ref 43–77)
Platelets: 262 10*3/uL (ref 150–400)
RBC: 4.7 MIL/uL (ref 3.87–5.11)
RDW: 15.2 % (ref 11.5–15.5)
WBC: 10 10*3/uL (ref 4.0–10.5)

## 2014-05-27 LAB — COMPREHENSIVE METABOLIC PANEL
ALT: 16 U/L (ref 0–35)
AST: 18 U/L (ref 0–37)
Albumin: 4.4 g/dL (ref 3.5–5.2)
Alkaline Phosphatase: 83 U/L (ref 39–117)
BUN: 12 mg/dL (ref 6–23)
CO2: 26 mEq/L (ref 19–32)
Calcium: 10.1 mg/dL (ref 8.4–10.5)
Chloride: 103 mEq/L (ref 96–112)
Creat: 0.79 mg/dL (ref 0.50–1.10)
Glucose, Bld: 102 mg/dL — ABNORMAL HIGH (ref 70–99)
Potassium: 4.4 mEq/L (ref 3.5–5.3)
Sodium: 137 mEq/L (ref 135–145)
Total Bilirubin: 0.4 mg/dL (ref 0.2–1.2)
Total Protein: 6.7 g/dL (ref 6.0–8.3)

## 2014-05-27 MED ORDER — TRIAMCINOLONE ACETONIDE 0.1 % EX CREA: 1.0000 "application " | TOPICAL_CREAM | Freq: Two times a day (BID) | CUTANEOUS | Status: DC

## 2014-05-27 MED ORDER — OMEPRAZOLE 40 MG PO CPDR: 40.0000 mg | DELAYED_RELEASE_CAPSULE | Freq: Every day | ORAL | Status: DC

## 2014-05-27 NOTE — Patient Instructions (Signed)
Influenza Vaccine (Flu Vaccine, Inactivated or Recombinant) 2014-2015: What You Need to Know 1. Why get vaccinated? Influenza ("flu") is a contagious disease that spreads around the [place] every winter, usually between October and May. Flu is caused by influenza viruses, and is spread mainly by coughing, sneezing, and close contact. Anyone can get flu, but the risk of getting flu is highest among children. Symptoms come on suddenly and may last several days. They can include:  fever/chills  sore throat  muscle aches  fatigue  cough  headache  runny or stuffy nose Flu can make some people much sicker than others. These people include young children, people 65 and older, pregnant women, and people with certain health conditions-such as heart, lung or kidney disease, nervous system disorders, or a weakened immune system. Flu vaccination is especially important for these people, and anyone in close contact with them. Flu can also lead to pneumonia, and make existing medical conditions worse. It can cause diarrhea and seizures in children. Each year thousands of people in the [place] die from flu, and many more are hospitalized. Flu vaccine is the best protection against flu and its complications. Flu vaccine also helps prevent spreading flu from person to person. 2. Inactivated and recombinant flu vaccines You are getting an injectable flu vaccine, which is either an "inactivated" or "recombinant" vaccine. These vaccines do not contain any live influenza virus. They are given by injection with a needle, and often called the "flu shot."  A different live, attenuated (weakened) influenza vaccine is sprayed into the nostrils. This vaccine is described in a separate Vaccine Information Statement. Flu vaccination is recommended every year. Some children 6 months through 8 years of age might need two doses during one year. Flu viruses are always changing. Each year's flu vaccine is made  to protect against 3 or 4 viruses that are likely to cause disease that year. Flu vaccine cannot prevent all cases of flu, but it is the best defense against the disease.  It takes about 2 weeks for protection to develop after the vaccination, and protection lasts several months to a year. Some illnesses that are not caused by influenza virus are often mistaken for flu. Flu vaccine will not prevent these illnesses. It can only prevent influenza. Some inactivated flu vaccine contains a very small amount of a mercury-based preservative called thimerosal. Studies have shown that thimerosal in vaccines is not harmful, but flu vaccines that do not contain a preservative are available. 3. Some people should not get this vaccine Tell the person who gives you the vaccine:  If you have any severe, life-threatening allergies. If you ever had a life-threatening allergic reaction after a dose of flu vaccine, or have a severe allergy to any part of this vaccine, including (for example) an allergy to gelatin, antibiotics, or eggs, you may be advised not to get vaccinated. Most, but not all, types of flu vaccine contain a small amount of egg protein.  If you ever had Guillain-Barr Syndrome (a severe paralyzing illness, also called GBS). Some people with a history of GBS should not get this vaccine. This should be discussed with your doctor.  If you are not feeling well. It is usually okay to get flu vaccine when you have a mild illness, but you might be advised to wait until you feel better. You should come back when you are better. 4. Risks of a vaccine reaction With a vaccine, like any medicine, there is a chance of side   effects. These are usually mild and go away on their own. Problems that could happen after any vaccine:  Brief fainting spells can happen after any medical procedure, including vaccination. Sitting or lying down for about 15 minutes can help prevent fainting, and injuries caused by a fall. Tell  your doctor if you feel dizzy, or have vision changes or ringing in the ears.  Severe shoulder pain and reduced range of motion in the arm where a shot was given can happen, very rarely, after a vaccination.  Severe allergic reactions from a vaccine are very rare, estimated at less than 1 in a million doses. If one were to occur, it would usually be within a few minutes to a few hours after the vaccination. Mild problems following inactivated flu vaccine:  soreness, redness, or swelling where the shot was given  hoarseness  sore, red or itchy eyes  cough  fever  aches  headache  itching  fatigue If these problems occur, they usually begin soon after the shot and last 1 or 2 days. Moderate problems following inactivated flu vaccine:  Young children who get inactivated flu vaccine and pneumococcal vaccine (PCV13) at the same time may be at increased risk for seizures caused by fever. Ask your doctor for more information. Tell your doctor if a child who is getting flu vaccine has ever had a seizure. Inactivated flu vaccine does not contain live flu virus, so you cannot get the flu from this vaccine. As with any medicine, there is a very remote chance of a vaccine causing a serious injury or death. The safety of vaccines is always being monitored. For more information, visit: www.cdc.gov/vaccinesafety/ 5. What if there is a serious reaction? What should I look for?  Look for anything that concerns you, such as signs of a severe allergic reaction, very high fever, or behavior changes. Signs of a severe allergic reaction can include hives, swelling of the face and throat, difficulty breathing, a fast heartbeat, dizziness, and weakness. These would start a few minutes to a few hours after the vaccination. What should I do?  If you think it is a severe allergic reaction or other emergency that can't wait, call 9-1-1 and get the person to the nearest hospital. Otherwise, call your  doctor.  Afterward, the reaction should be reported to the Vaccine Adverse Event Reporting System (VAERS). Your doctor should file this report, or you can do it yourself through the VAERS web site at www.vaers.hhs.gov, or by calling 1-800-822-7967. VAERS does not give medical advice. 6. The National Vaccine Injury Compensation Program The National Vaccine Injury Compensation Program (VICP) is a federal program that was created to compensate people who may have been injured by certain vaccines. Persons who believe they may have been injured by a vaccine can learn about the program and about filing a claim by calling 1-800-338-2382 or visiting the VICP website at www.hrsa.gov/vaccinecompensation. There is a time limit to file a claim for compensation. 7. How can I learn more?  Ask your health care provider.  Call your local or state health department.  Contact the Centers for Disease Control and Prevention (CDC):  Call 1-800-232-4636 (1-800-CDC-INFO) or  Visit CDC's website at www.cdc.gov/flu CDC Vaccine Information Statement (Interim) Inactivated Influenza Vaccine (04/28/2013) Document Released: 06/21/2006 Document Revised: 01/11/2014 Document Reviewed: 08/14/2013 ExitCare Patient Information 2015 [employer]. This information is not intended to replace advice given to you by your health care provider. Make sure you discuss any questions you have with your health   care provider.  

## 2014-05-27 NOTE — Progress Notes (Signed)
This chart was scribed for [name], MD by Ladene Artist, ED Scribe. The patient was seen in room 27. Patient's care was started at 12:24 PM.  Patient ID: Ellen Johnson, female   DOB: April 03, 1936, 78 y.o.   MRN: [id number]    Patient ID: Ellen Johnson MRN: [id number], DOB: 01/24/1936, 78 y.o. Date of Encounter: 05/27/2014, 12:24 PM  Primary Physician: [name], MD  Chief Complaint  Patient presents with  . Sore    lesion on chest  . stomach ulcers    flare up per pt    HPI: 78 y.o. year old female with history below presents with a rash to her nose, eyes and forehead over the past week. She reports associated puffiness around her eyes and cold-like symptoms. Pt denies itching. She has applied diaper cream with no relief.  Pt also presents with stomach ulcers. She states that she was coughing up blooding while on a plane and was diagnosed with 2 bleeding ulcers. Pt states that she had to have emergency complete hysterectomy. She still reports associated nausea every morning for the past 2 months. Pt has tried a medication for indigestion before she eats cereal with fruit on top. She reports mild relief with this regimen.   She also reports productive cough. Pt states that she has tried Mucinex with no relief.  Occasional reflux symptoms.  Pt states that she relaxes by sitting on the recliner with Ed on the mantle while she listens to music. Pt's husband, Ed, of 83 years passed on 05-20-2014. 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 She used to work as a Hospital doctor. Pt goes out to eat every Wednesday with a widows support group. Pt has lost 14 lbs but expresses her desire to lose more. She swam all summer and plans to swim this winter at [facility].   Past Medical History  Diagnosis Date  . GERD (gastroesophageal reflux disease)     Home Meds: Prior to Admission medications     Medication Sig Start Date End Date Taking? Authorizing Provider  acetaminophen (TYLENOL) 500 MG tablet Take 500 mg by mouth every 6 (six) hours as needed.    Historical Provider, MD  cholecalciferol (VITAMIN D) 1000 UNITS tablet Take 1,000 Units by mouth daily.    Historical Provider, MD  fish oil-omega-3 fatty acids 1000 MG capsule Take 2 g by mouth daily.    Historical Provider, MD  furosemide (LASIX) 20 MG tablet Take 1 tablet (20 mg total) by mouth daily. 10/08/13   [name], MD  Multiple Vitamin (MULTIVITAMIN) tablet Take 1 tablet by mouth daily.    Historical Provider, MD  omeprazole (PRILOSEC) 40 MG capsule Take 40 mg by mouth daily.    Historical Provider, MD  traMADol (ULTRAM) 50 MG tablet Take 1 tablet (50 mg total) by mouth every 6 (six) hours as needed for pain. 02/15/13   Roselee Culver, MD  triamcinolone cream (KENALOG) 0.1 % APPLY 1 APPLICATION TOPICALLY 2 (TWO) TIMES DAILY.    [name], MD    Allergies:  Allergies  Allergen Reactions  . Valium [Diazepam] Other (See Comments)    Totally different personality    History   Social History  . Marital Status: Widowed    Spouse Name: N/A    Number of Children: N/A  . Years of Education: N/A   Occupational History  . Not on  file.   Social History Main Topics  . Smoking status: Never Smoker   . Smokeless tobacco: Never Used  . Alcohol Use: Yes  . Drug Use: Not on file  . Sexual Activity: Not on file   Other Topics Concern  . Not on file   Social History Narrative  . No narrative on file     Review of Systems: Constitutional: negative for chills, fever, night sweats, weight changes, or fatigue  HEENT: negative for vision changes, hearing loss, congestion, rhinorrhea, ST, epistaxis, or sinus pressure Cardiovascular: negative for chest pain or palpitations Respiratory: negative for hemoptysis, wheezing, or shortness of breath, +cough Abdominal: negative for abdominal pain, vomiting, diarrhea, or  constipation, +nausea Dermatological: +rash Neurologic: negative for headache, dizziness, or syncope All other systems reviewed and are otherwise negative with the exception to those above and in the HPI.  Physical Exam: Triage Vitals: Blood pressure 151/80, pulse 90, temperature 98.5 F (36.9 C), resp. rate 16, height 5\' 5"  (1.651 m), weight 214 lb (97.07 kg), SpO2 93.00%., Body mass index is 35.61 kg/(m^2). General: Well developed, well nourished, in no acute distress. Head: Normocephalic, atraumatic, eyes without discharge, sclera non-icteric, nares are without discharge. Bilateral auditory canals clear, TM's are without perforation, pearly grey and translucent with reflective cone of light bilaterally. Oral cavity moist, posterior pharynx without exudate, erythema, peritonsillar abscess, or post nasal drip.  Neck: Supple. No thyromegaly. Full ROM. No lymphadenopathy. Lungs: Clear bilaterally to auscultation without wheezes, rales, or rhonchi. Breathing is unlabored. Heart: RRR with S1 S2. No murmurs, rubs, or gallops appreciated. Abdomen: Soft, non-tender, non-distended with normoactive bowel sounds. No hepatomegaly. No rebound/guarding. No obvious abdominal masses.  She has fullness in epigastrium without definite mass Msk:  Strength and tone normal for age. Extremities/Skin: Warm and dry. No clubbing or cyanosis. No edema. No suspicious lesions. Neuro: Alert and oriented X 3. Moves all extremities spontaneously. Gait is normal. CNII-XII grossly in tact. Psych:  Responds to questions appropriately with a normal affect.     ASSESSMENT AND PLAN:  78 y.o. year old female with epigastric fullness and nausea, probably reflux or hiatal hernia.  Will try prilosec, but she needs further evaluation (see below) 1. Need for prophylactic vaccination and inoculation against influenza   2. Eczema   3. Nausea alone   check CMET and CBC Check CT abdomen with contrast I personally performed the  services described in this documentation, which was scribed in my presence. The recorded information has been reviewed and is accurate.  Signed, [name], MD 05/27/2014 12:24 PM

## 2014-05-31 ENCOUNTER — Telehealth: Payer: Self-pay | Admitting: Family Medicine

## 2014-05-31 DIAGNOSIS — R05 Cough: Secondary | ICD-10-CM

## 2014-05-31 DIAGNOSIS — R059 Cough, unspecified: Secondary | ICD-10-CM

## 2014-05-31 MED ORDER — AZITHROMYCIN 250 MG PO TABS: ORAL_TABLET | ORAL | Status: DC

## 2014-05-31 NOTE — Telephone Encounter (Signed)
Patient called because she is having low grade fever, cough, productive. No SOB or wheezing. Was told by [name] to call if she wasn't feeling better when she was seen last week.

## 2014-06-02 ENCOUNTER — Other Ambulatory Visit: Payer: Self-pay | Admitting: Family Medicine

## 2014-06-02 DIAGNOSIS — R05 Cough: Secondary | ICD-10-CM

## 2014-06-02 DIAGNOSIS — R059 Cough, unspecified: Secondary | ICD-10-CM

## 2014-06-02 MED ORDER — AZITHROMYCIN 250 MG PO TABS: ORAL_TABLET | ORAL | Status: DC

## 2014-07-01 ENCOUNTER — Other Ambulatory Visit: Payer: Medicare Other

## 2014-07-05 ENCOUNTER — Ambulatory Visit (INDEPENDENT_AMBULATORY_CARE_PROVIDER_SITE_OTHER): Payer: Medicare Other | Admitting: Family Medicine

## 2014-07-05 ENCOUNTER — Encounter: Payer: Self-pay | Admitting: Family Medicine

## 2014-07-05 VITALS — BP 152/70 | HR 72 | Temp 98.0°F | Resp 16 | Ht 64.5 in | Wt 216.2 lb

## 2014-07-05 DIAGNOSIS — L989 Disorder of the skin and subcutaneous tissue, unspecified: Secondary | ICD-10-CM

## 2014-07-05 DIAGNOSIS — L539 Erythematous condition, unspecified: Secondary | ICD-10-CM

## 2014-07-05 DIAGNOSIS — L918 Other hypertrophic disorders of the skin: Secondary | ICD-10-CM

## 2014-07-05 DIAGNOSIS — E538 Deficiency of other specified B group vitamins: Secondary | ICD-10-CM

## 2014-07-05 MED ORDER — CYANOCOBALAMIN 1000 MCG/ML IJ SOLN
1000.0000 ug | Freq: Once | INTRAMUSCULAR | Status: AC
  Administered 2014-07-05: 1000 ug via INTRAMUSCULAR

## 2014-07-05 NOTE — Progress Notes (Signed)
Patient ID: UBAH RADKE, female   DOB: [birth year], 78 y.o.   MRN: [id number]  This chart was scribed for [name], MD by Ladene Artist, ED Scribe. The patient was seen in room 28. Patient's care was started at 10:58 AM.  Patient ID: DEMARIA DEENEY MRN: [id number], DOB: 1935-12-28, 78 y.o. Date of Encounter: 07/05/2014, 11:36 AM  Primary Physician: [name], MD  Chief Complaint  Patient presents with   Follow-up    reck place on chest    HPI: 78 y.o. year old female with history below presents for a follow-up. Pt repots concern of a red spot on her L chest. She reports associated pain and constant itching to the area. She has applied triple antibiotic ointment to the area.    Skin tag Pt presents with a skin tag on the L side of her neck that she wishes to have removed. She reports similar skin tag in the past that required removal as well.   Pt reports that she has lost 11 lbs since her last visit. 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   Past Medical History  Diagnosis Date   GERD (gastroesophageal reflux disease)      Home Meds: Prior to Admission medications   Medication Sig Start Date End Date Taking? Authorizing Provider  acetaminophen (TYLENOL) 500 MG tablet Take 500 mg by mouth every 6 (six) hours as needed.    Historical Provider, MD  azithromycin (ZITHROMAX Z-PAK) 250 MG tablet Take as directed on pack 06/02/14   [name], MD  cholecalciferol (VITAMIN D) 1000 UNITS tablet Take 1,000 Units by mouth daily.    Historical Provider, MD  fish oil-omega-3 fatty acids 1000 MG capsule Take 2 g by mouth daily.    Historical Provider, MD  furosemide (LASIX) 20 MG tablet Take 1 tablet (20 mg total) by mouth daily. 10/08/13   [name], MD  Multiple Vitamin (MULTIVITAMIN) tablet Take 1 tablet by mouth daily.    Historical Provider, MD  omeprazole (PRILOSEC) 40 MG capsule Take 1 capsule (40 mg total) by mouth daily. 05/27/14   [name], MD  traMADol (ULTRAM) 50 MG tablet Take 1 tablet (50 mg total) by mouth every 6 (six) hours as needed for pain. 02/15/13   Roselee Culver, MD  triamcinolone cream (KENALOG) 0.1 % APPLY 1 APPLICATION TOPICALLY 2 (TWO) TIMES DAILY.    [name], MD  triamcinolone cream (KENALOG) 0.1 % Apply 1 application topically 2 (two) times daily. 05/27/14   [name], MD    Allergies:  Allergies  Allergen Reactions   Valium [Diazepam] Other (See Comments)    Totally different personality    History   Social History   Marital Status: Widowed    Spouse Name: N/A    Number of Children: N/A   Years of Education: N/A   Occupational History   Not on file.   Social History Main Topics   Smoking status: Never Smoker    Smokeless tobacco: Never Used   Alcohol Use: Yes   Drug Use: Not on file   Sexual Activity: Not on file   Other Topics Concern   Not on file   Social History Narrative   No narrative on file     Review of Systems: Constitutional: negative for chills, fever, night sweats, weight changes, or fatigue  HEENT: negative for vision changes, hearing loss, congestion, rhinorrhea, ST, epistaxis, or sinus pressure Cardiovascular: negative for chest pain or palpitations Respiratory: negative  for hemoptysis, wheezing, shortness of breath, or cough Abdominal: negative for abdominal pain, nausea, vomiting, diarrhea, or constipation Dermatological: +rash Neurologic: negative for headache, dizziness, or syncope All other systems reviewed and are otherwise negative with the exception to those above and in the HPI.   Physical Exam: Triage Vitals: Blood pressure 152/70, pulse 72, temperature 98 F (36.7 C), temperature source Oral, resp. rate 16, height 5' 4.5" (1.638 m), weight 216 lb 3.2 oz (98.068 kg), SpO2 95.00%., Body mass index is 36.55 kg/(m^2). General: Well developed, well nourished, in no acute distress. Head: Normocephalic, atraumatic, eyes  without discharge, sclera non-icteric, nares are without discharge. Bilateral auditory canals clear, TM's are without perforation, pearly grey and translucent with reflective cone of light bilaterally. Oral cavity moist, posterior pharynx without exudate, erythema, peritonsillar abscess, or post nasal drip.  Neck: Supple. No thyromegaly. Full ROM. No lymphadenopathy. Lungs: Clear bilaterally to auscultation without wheezes, rales, or rhonchi. Breathing is unlabored. Heart: RRR with S1 S2. No murmurs, rubs, or gallops appreciated. Abdomen: Soft, non-tender, non-distended with normoactive bowel sounds. No hepatomegaly. No rebound/guarding. No obvious abdominal masses. Msk:  Strength and tone normal for age. Extremities/Skin: Warm and dry. No clubbing or cyanosis. No edema. No rashes or suspicious lesions. Neuro: Alert and oriented X 3. Moves all extremities spontaneously. Gait is normal. CNII-XII grossly in tact. Psych:  Responds to questions appropriately with a normal affect.   Labs: Results for orders placed in visit on 05/27/14  CBC WITH DIFFERENTIAL      Result Value Ref Range   WBC 10.0  4.0 - 10.5 K/uL   RBC 4.70  3.87 - 5.11 MIL/uL   Hemoglobin 13.3  12.0 - 15.0 g/dL   HCT 39.0  36.0 - 46.0 %   MCV 83.0  78.0 - 100.0 fL   MCH 28.3  26.0 - 34.0 pg   MCHC 34.1  30.0 - 36.0 g/dL   RDW 15.2  11.5 - 15.5 %   Platelets 262  150 - 400 K/uL   Neutrophils Relative % 70  43 - 77 %   Neutro Abs 7.0  1.7 - 7.7 K/uL   Lymphocytes Relative 20  12 - 46 %   Lymphs Abs 2.0  0.7 - 4.0 K/uL   Monocytes Relative 8  3 - 12 %   Monocytes Absolute 0.8  0.1 - 1.0 K/uL   Eosinophils Relative 2  0 - 5 %   Eosinophils Absolute 0.2  0.0 - 0.7 K/uL   Basophils Relative 0  0 - 1 %   Basophils Absolute 0.0  0.0 - 0.1 K/uL   Smear Review Criteria for review not met    COMPREHENSIVE METABOLIC PANEL      Result Value Ref Range   Sodium 137  135 - 145 mEq/L   Potassium 4.4  3.5 - 5.3 mEq/L   Chloride 103   96 - 112 mEq/L   CO2 26  19 - 32 mEq/L   Glucose, Bld 102 (*) 70 - 99 mg/dL   BUN 12  6 - 23 mg/dL   Creat 0.79  0.50 - 1.10 mg/dL   Total Bilirubin 0.4  0.2 - 1.2 mg/dL   Alkaline Phosphatase 83  39 - 117 U/L   AST 18  0 - 37 U/L   ALT 16  0 - 35 U/L   Total Protein 6.7  6.0 - 8.3 g/dL   Albumin 4.4  3.5 - 5.2 g/dL   Calcium 10.1  8.4 - 10.5  mg/dL   Left neck:  Sterile prep, 1% xylo with epi local, excision irritated skin cyst Left upper breast:  Irregular, peeling, erythematous 3 cm area:  Sterile prep, 1%xylo local with epi, excision superior margin of what appears to be a squamous cell ca, repaired with running 4-0 ethilon   ASSESSMENT AND PLAN:  78 y.o. year old female with  1. Vitamin B12 deficiency    I personally performed the services described in this documentation, which was scribed in my presence. The recorded information has been reviewed and is accurate.  Vitamin B12 deficiency - Plan: cyanocobalamin ((VITAMIN B-12)) injection 1,000 mcg, CANCELED: Vitamin B12  Skin lesion of chest wall - Plan: Dermatology pathology Recheck 5 days  Signed, [name], MD   Signed, [name], MD 07/05/2014 11:36 AM

## 2014-07-10 ENCOUNTER — Other Ambulatory Visit: Payer: Self-pay | Admitting: Family Medicine

## 2014-07-10 ENCOUNTER — Telehealth: Payer: Self-pay | Admitting: Physician Assistant

## 2014-07-10 NOTE — Telephone Encounter (Signed)
Spoke with the patient. She will plan to return for additional excision when [name] is back. She will return for suture removal in the meantime.

## 2014-07-10 NOTE — Telephone Encounter (Signed)
Patient returned phone call, unable to find anyone to assist with the call. Please return patient phone call.

## 2014-07-10 NOTE — Telephone Encounter (Signed)
Message copied by Fara Chute on Sat Jul 10, 2014  9:07 AM ------      Message from: Venetia Night      Created: Sat Jul 10, 2014  8:54 AM       Please call pt ------

## 2014-07-10 NOTE — Telephone Encounter (Addendum)
Biopsy revealed basal cell carcinoma. This is a skin cancer that doesn't metastasize. Needs to return for removal of the rest of the lesion. [name] is out of the office this week, and would like her to come in the following week for the procedure.

## 2014-07-29 ENCOUNTER — Encounter: Payer: Self-pay | Admitting: Family Medicine

## 2014-07-29 ENCOUNTER — Ambulatory Visit (INDEPENDENT_AMBULATORY_CARE_PROVIDER_SITE_OTHER): Payer: Medicare Other | Admitting: Family Medicine

## 2014-07-29 VITALS — BP 154/82 | HR 77 | Temp 98.2°F | Resp 16 | Ht 63.5 in | Wt 214.6 lb

## 2014-07-29 DIAGNOSIS — C4491 Basal cell carcinoma of skin, unspecified: Secondary | ICD-10-CM

## 2014-07-29 NOTE — Progress Notes (Signed)
   Subjective:    Patient ID: Ellen Johnson, female    DOB: [birth year], 78 y.o.   MRN: [id number] This chart was scribed for Ellen Haber, MD by Marti Sleigh, Medical Scribe. This patient was seen in Room 26 and the patient's care was started a 12:03 PM.   HPI HPI Comments: Ellen Johnson is a 78 y.o. female who presents to [facility] reporting for skin cancer removal on the upper left chest.   Review of Systems     Objective:   Physical Exam  Constitutional: She is oriented to person, place, and time. She appears well-developed and well-nourished.  HENT:  Head: Normocephalic and atraumatic.  Eyes: Pupils are equal, round, and reactive to light.  Neck: No JVD present.  Cardiovascular: Normal rate and regular rhythm.   Pulmonary/Chest: Effort normal and breath sounds normal. No respiratory distress.  Neurological: She is alert and oriented to person, place, and time.  Skin: Skin is warm and dry.  Psychiatric: She has a normal mood and affect. Her behavior is normal.  Nursing note and vitals reviewed.  3 cm left upper skin lesion excised after 1% xylo with epi, closed with 4-0 running locked suture.  Sterile dressing. Prepped with betadine beforehand.    Assessment & Plan:  Basal cell carcinoma of skin - Plan: Dermatology pathology Follow up 1 month Signed, Ellen Haber, MD

## 2014-08-04 ENCOUNTER — Ambulatory Visit (INDEPENDENT_AMBULATORY_CARE_PROVIDER_SITE_OTHER): Payer: Medicare Other | Admitting: Family Medicine

## 2014-08-04 VITALS — BP 144/84 | HR 80 | Temp 98.3°F | Resp 16 | Ht 64.5 in | Wt 213.1 lb

## 2014-08-04 DIAGNOSIS — L03313 Cellulitis of chest wall: Secondary | ICD-10-CM

## 2014-08-04 NOTE — Progress Notes (Signed)
Subjective:   This chart was scribed for [name], MD by Forrestine Him, [facility] Scribe. This patient was seen in room 14 and the patient's care was started 6:22 PM.    Patient ID: Ellen Johnson, female    DOB: [birth year], 78 y.o.   MRN: [id number]  Chief Complaint  Patient presents with   Follow-up    Biopsy    HPI  HPI Comments: Ellen Johnson is a 78 y.o. female with a PMHx of GERD who presents to [facility] her for follow up today. Pt was seen 11/19 for skin cancer removal on the upper left chest. She was sent home with a course of Keflex which she has now completed half of her prescription. However, pt states she stopped taking the antibiotic. Ellen Johnson reports new redness to the suture site onset last night along with soreness to the area. No recent fever or chills. Pt with known allergy to Valium.  Patient Active Problem List   Diagnosis Date Noted   DIVERTICULOSIS OF COLON 07/30/2008   DYSPHAGIA UNSPECIFIED 07/30/2008   ABDOMINAL PAIN, LEFT LOWER QUADRANT 07/30/2008   ANEMIA-NOS 05/11/2008   HYPERTENSION, SYSTOLIC, BORDERLINE 65/68/1275   GERD 05/11/2008   HEADACHE 05/11/2008   COLONIC POLYPS, HX OF 11/28/2007   Past Medical History  Diagnosis Date   GERD (gastroesophageal reflux disease)    Past Surgical History  Procedure Laterality Date   Abdominal hysterectomy     Gall bladder removed     Fracture surgery     Allergies  Allergen Reactions   Valium [Diazepam] Other (See Comments)    Totally different personality   Prior to Admission medications   Medication Sig Start Date End Date Taking? Authorizing Provider  acetaminophen (TYLENOL) 500 MG tablet Take 500 mg by mouth every 6 (six) hours as needed.   Yes Historical Provider, MD  acidophilus (RISAQUAD) CAPS capsule Take by mouth daily.   Yes Historical Provider, MD  calcium carbonate 200 MG capsule Take 250 mg by mouth 2 (two) times daily  with a meal.   Yes Historical Provider, MD  cholecalciferol (VITAMIN D) 1000 UNITS tablet Take 1,000 Units by mouth daily.   Yes Historical Provider, MD  fish oil-omega-3 fatty acids 1000 MG capsule Take 2 g by mouth daily.   Yes Historical Provider, MD  furosemide (LASIX) 20 MG tablet TAKE 1 TABLET BY MOUTH EVERY DAY 07/12/14  Yes [name], MD  Multiple Vitamin (MULTIVITAMIN) tablet Take 1 tablet by mouth daily.   Yes Historical Provider, MD  omeprazole (PRILOSEC) 40 MG capsule Take 1 capsule (40 mg total) by mouth daily. 05/27/14  Yes [name], MD  traMADol (ULTRAM) 50 MG tablet Take 1 tablet (50 mg total) by mouth every 6 (six) hours as needed for pain. 02/15/13  Yes Roselee Culver, MD  triamcinolone cream (KENALOG) 0.1 % Apply 1 application topically 2 (two) times daily. 05/27/14  Yes [name], MD  HYDROcodone-acetaminophen (NORCO/VICODIN) 5-325 MG per tablet  07/21/14   Historical Provider, MD     Review of Systems  Constitutional: Negative for fever and chills.  Skin: Positive for color change and wound.    Triage Vitals: BP 144/84 mmHg   Pulse 80   Temp(Src) 98.3 F (36.8 C) (Oral)   Resp 16   Ht 5' 4.5" (1.638 m)   Wt 213 lb 2 oz (96.673 kg)   BMI 36.03 kg/m2   SpO2 98%   Objective:  Physical Exam  Constitutional: She is oriented to person, place, and time. She appears well-developed and well-nourished.  HENT:  Head: Normocephalic.  Eyes: EOM are normal.  Neck: Normal range of motion.  Pulmonary/Chest: Effort normal.  Abdominal: She exhibits no distension.  Musculoskeletal: Normal range of motion.  Neurological: She is alert and oriented to person, place, and time.  Psychiatric: She has a normal mood and affect.  Nursing note and vitals reviewed.  Patient has tenderness, warmth, and significant erythema along the wound edge for 2 cm laterally in each side. The sutures were removed and wound edges were reinforced with Dermabond although they were well  approximated with little or no sign of scarring.  Assessment & Plan:   I personally performed the services described in this documentation, which was scribed in my presence. The recorded information has been reviewed and is accurate.    Patient has mild cellulitis of the chest. She's going to take the Keflex as directed the next 5 days and call me if the pain and soreness as well as redness does not resolve.  Signed, [name], MD

## 2014-08-07 ENCOUNTER — Telehealth: Payer: Self-pay

## 2014-08-07 NOTE — Telephone Encounter (Signed)
Pt called in and stated that the antiobiotics that Dr. Carlean Jews gave her were making her sick on her stomach and would like some phenergan called in to help with this.   She uses the [facility] on [address].

## 2014-08-09 MED ORDER — PROMETHAZINE HCL 25 MG PO TABS: 25.0000 mg | ORAL_TABLET | Freq: Three times a day (TID) | ORAL | Status: DC | PRN

## 2014-08-09 NOTE — Telephone Encounter (Signed)
Phenergan sent in per Dr Joseph Art. Left message for her to call me back, Dr L wants me to tell her to take antibiotic with food and to increase her water intake.

## 2014-08-12 ENCOUNTER — Telehealth: Payer: Self-pay

## 2014-08-12 NOTE — Telephone Encounter (Signed)
Pt called in and wanted to let Dr Joseph Art know that the medication is working. She will she him on 12/17. Thank you

## 2014-08-17 ENCOUNTER — Telehealth: Payer: Self-pay

## 2014-08-17 NOTE — Telephone Encounter (Signed)
Pt called because she was told by [employer], where she works, that she has cancer and she doesn't know anything about this. Asked me to check on this and/or send message to Dr L concerning this. I checked her chart and called her back to advise that they were probably speaking of the skin cancer she recently had removed here. I explained that it was called basal cell carcinoma and is a type that does not metastasize per Chelle's note on 07/10/14 message. Pt didn't realize that that was called a cancer but will not worry about it since she has had it removed and doesn't metastasize.

## 2014-08-26 ENCOUNTER — Encounter: Payer: Self-pay | Admitting: Family Medicine

## 2014-08-26 ENCOUNTER — Ambulatory Visit (INDEPENDENT_AMBULATORY_CARE_PROVIDER_SITE_OTHER): Payer: Medicare Other

## 2014-08-26 ENCOUNTER — Ambulatory Visit (INDEPENDENT_AMBULATORY_CARE_PROVIDER_SITE_OTHER): Payer: Medicare Other | Admitting: Family Medicine

## 2014-08-26 VITALS — BP 140/80 | HR 76 | Temp 98.8°F | Resp 16 | Ht 64.0 in | Wt 213.0 lb

## 2014-08-26 DIAGNOSIS — E538 Deficiency of other specified B group vitamins: Secondary | ICD-10-CM

## 2014-08-26 DIAGNOSIS — R059 Cough, unspecified: Secondary | ICD-10-CM

## 2014-08-26 DIAGNOSIS — R05 Cough: Secondary | ICD-10-CM

## 2014-08-26 MED ORDER — CYANOCOBALAMIN 1000 MCG/ML IJ SOLN
1000.0000 ug | Freq: Once | INTRAMUSCULAR | Status: AC
  Administered 2014-08-26: 1000 ug via INTRAMUSCULAR

## 2014-08-26 MED ORDER — CEPHALEXIN 500 MG PO CAPS: 500.0000 mg | ORAL_CAPSULE | Freq: Two times a day (BID) | ORAL | Status: DC

## 2014-08-26 NOTE — Progress Notes (Signed)
   Subjective:    Patient ID: Ellen Johnson, female    DOB: 1935/11/14, 78 y.o.   MRN: [id number] This chart was scribed for [name], MD by Marti Sleigh, Medical Scribe. This patient was seen in Room 25 and the patient's care was started a 3:14 PM.  Chief Complaint  Patient presents with  . Follow up skin cancer    HPI HPI Comments: Ellen Johnson is a 78 y.o. female with a PMHx of GERD, HTN and colon polyps who presents to [facility] her for follow up today. Pt was seen 11/19 for skin cancer removal on the upper left chest. Pt also endorses pain, redness and burning at the site of the surgical excision. Pt states she is experiencing severe nausea every morning, which she associates with her abx prescription. Pt states she has discontinued taking her abx medication due to nausea, but her nausea sx have continued. Pt states she would like any vaccinations she is missing. Pt would like a prevnar 13 vaccination.   Review of Systems  Constitutional: Negative for fever and chills.  HENT: Positive for congestion.   Gastrointestinal: Positive for nausea.  Skin: Positive for color change and wound.       Objective:   Physical Exam  Constitutional: She is oriented to person, place, and time. She appears well-developed and well-nourished.  HENT:  Head: Normocephalic and atraumatic.  Eyes: Pupils are equal, round, and reactive to light.  Neck: Neck supple.  Cardiovascular: Normal rate and regular rhythm.   Pulmonary/Chest: Effort normal and breath sounds normal. No respiratory distress.  Neurological: She is alert and oriented to person, place, and time.  Skin: Skin is warm and dry.  Psychiatric: She has a normal mood and affect. Her behavior is normal.  Nursing note and vitals reviewed. [facility] reading (PRIMARY) by  [name]:  CXR negative.   Tender at the upper rib sternal junction on left side. Pt has mild vesicular rash at the site of the squamous  cell skin cancer on the left chest.    Assessment & Plan:     This chart was scribed in my presence and reviewed by me personally.    ICD-9-CM ICD-10-CM   1. Cough 786.2 R05 DG Chest 2 View  2. Vitamin B12 deficiency 266.2 E53.8 cyanocobalamin ((VITAMIN B-12)) injection 1,000 mcg  keflex bid for cellulitis   Signed, [name], MD

## 2014-09-07 ENCOUNTER — Telehealth: Payer: Self-pay

## 2014-09-07 NOTE — Telephone Encounter (Signed)
Patient states that she needs her wound (in breast area) rechecked.  She states that she is taking the antibiotic but that it still feels sore.  Patient has appt scheduled on 09/30/14 @ 3:45pm.  Please call to let patient know if she needs to be seen sooner.

## 2014-09-14 NOTE — Telephone Encounter (Signed)
LM to check status of pt- advised to RTC to be evaluated/rtn call to get Dr.Lauenstein's schedule in the walk in clinic.

## 2014-09-14 NOTE — Telephone Encounter (Signed)
I would like to see patient Friday when I am working.

## 2014-09-15 ENCOUNTER — Telehealth: Payer: Self-pay

## 2014-09-15 NOTE — Telephone Encounter (Signed)
Patient returning call to the clinic from a message that was put in on 09/07/14 for her regarding her wound care. I informed patient our office had tried to contact her to let her know Dr Joseph Art had requested her to follow up with him on this Friday. Per patient she is unable to come in right now because she is to be schedule soon for eye surgery. Patient wanted to let [name] know she has been taking the antibiotics and the area has improved (wound breast area).. Patients call back number is [phone] or [phone]

## 2014-09-15 NOTE — Telephone Encounter (Signed)
LMOM to follow up on Friday

## 2014-09-20 NOTE — Telephone Encounter (Signed)
I do want to reevaluate the chest rash soon or I can refer her to a surgeon

## 2014-09-21 NOTE — Telephone Encounter (Signed)
Spoke to pt she is very upset and only wants to speak to [place].

## 2014-09-21 NOTE — Telephone Encounter (Signed)
Lm for pt to come into the office tonight to see [name].

## 2014-09-21 NOTE — Telephone Encounter (Signed)
She does not a have transportation to RTC to office tonight. She would rather have sara hansen to call her back.

## 2014-09-21 NOTE — Telephone Encounter (Signed)
Patient wants Clarise Cruz to return call.   [phone]

## 2014-09-23 NOTE — Telephone Encounter (Signed)
Lm for pt to rtn call- left my hours for today.

## 2014-09-23 NOTE — Telephone Encounter (Signed)
He car is in the shop, she has no transportation, she will see dr Federico Flake at her appt on 09/30/2014. Her antibiotics are working, and the wound is healing nicely.

## 2014-09-30 ENCOUNTER — Ambulatory Visit (INDEPENDENT_AMBULATORY_CARE_PROVIDER_SITE_OTHER): Payer: Medicare Other | Admitting: Family Medicine

## 2014-09-30 ENCOUNTER — Other Ambulatory Visit: Payer: Self-pay | Admitting: Family Medicine

## 2014-09-30 ENCOUNTER — Encounter: Payer: Self-pay | Admitting: Family Medicine

## 2014-09-30 VITALS — BP 130/66 | HR 80 | Temp 98.7°F | Resp 16 | Ht 64.0 in | Wt 211.0 lb

## 2014-09-30 DIAGNOSIS — C4491 Basal cell carcinoma of skin, unspecified: Secondary | ICD-10-CM

## 2014-09-30 DIAGNOSIS — Z23 Encounter for immunization: Secondary | ICD-10-CM | POA: Diagnosis not present

## 2014-09-30 DIAGNOSIS — L989 Disorder of the skin and subcutaneous tissue, unspecified: Secondary | ICD-10-CM | POA: Diagnosis not present

## 2014-09-30 DIAGNOSIS — C44519 Basal cell carcinoma of skin of other part of trunk: Secondary | ICD-10-CM | POA: Diagnosis not present

## 2014-09-30 NOTE — Progress Notes (Addendum)
° °  Subjective:    Patient ID: Ellen Johnson, female    DOB: 05-Jun-1936, 79 y.o.   MRN: [id number] This chart was scribed for [name], MD by Zola Button, Medical Scribe. This patient was seen in Room 26 and the patient's care was started at 4:29 PM.   HPI HPI Comments: Ellen Johnson is a 79 y.o. female who presents to the [facility] for a left breast re-check. Patient was seen on 07/29/14 for a skin cancer removal in her upper left chest.  Review of Systems     Objective:   Physical Exam CONSTITUTIONAL: Well developed/well nourished HEAD: Normocephalic/atraumatic EYES: EOM/PERRL ENMT: Mucous membranes moist NECK: supple no meningeal signs SPINE: entire spine nontender CV: S1/S2 noted, no murmurs/rubs/gallops noted LUNGS: Lungs are clear to auscultation bilaterally, no apparent distress ABDOMEN: soft, nontender, no rebound or guarding GU: no cva tenderness NEURO: Pt is awake/alert, moves all extremitiesx4 EXTREMITIES: pulses normal, full ROM SKIN: warm, color normal Erythematous irregular edged 2x3.5 cm left superior anterior chest skin rash. PSYCH: no abnormalities of mood noted  Diagnosis Skin (M), left upper chest EXCISION, RESIDUAL BASAL CELL CARCINOMA, NODULAR PATTERN, EXTENDING TO THE 9:00 MARGIN Microscopic Description There are dermal aggregates of atypical basaloid cells which are predominately in large nests. There is a scar and other inflammatory changes consistent with previous procedure at this site. Basal cell carcinoma extends to the 9:00, peripheral surgical margin. Holley Dexter MD Ph.D. Dermatopathologist, Electronic Signature (Case signed 08/05/2014)  3 cm excisional biopsy from left breast with 10 erupted 5-0 sutures for closure. Superior medial border was identified with suture.  Betadine prep.  Sterile dressing    Assessment & Plan:   Skin pathology  This chart was scribed in my presence and reviewed by me personally.    ICD-9-CM ICD-10-CM   1. Need for prophylactic vaccination against Streptococcus pneumoniae (pneumococcus) V03.82 Z23 Pneumococcal conjugate vaccine 13-valent IM  2. Skin lesion of chest wall 709.9 L98.9 Dermatology pathology     Signed, [name], MD

## 2014-10-20 ENCOUNTER — Ambulatory Visit (INDEPENDENT_AMBULATORY_CARE_PROVIDER_SITE_OTHER): Payer: Medicare Other | Admitting: Family Medicine

## 2014-10-20 VITALS — BP 104/60 | HR 94 | Temp 98.0°F | Resp 18 | Ht 64.0 in | Wt 211.0 lb

## 2014-10-20 DIAGNOSIS — G43A0 Cyclical vomiting, not intractable: Secondary | ICD-10-CM | POA: Diagnosis not present

## 2014-10-20 DIAGNOSIS — R21 Rash and other nonspecific skin eruption: Secondary | ICD-10-CM | POA: Diagnosis not present

## 2014-10-20 DIAGNOSIS — R1115 Cyclical vomiting syndrome unrelated to migraine: Secondary | ICD-10-CM

## 2014-10-20 MED ORDER — MUPIROCIN CALCIUM 2 % EX CREA: 1.0000 "application " | TOPICAL_CREAM | Freq: Two times a day (BID) | CUTANEOUS | Status: DC

## 2014-10-20 NOTE — Progress Notes (Signed)
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  She has been applying peroxide to the area and it is now dry, scaly and red  She also complains of persistent am vomiting despite normal colonoscopy by Dr. Benson Norway.  No endoscopy performed.  She would like CT scan done in March  Objective:  Eczematous skin changes along skin edges of left breast skin cancer suture line Sutures removed Biopsy results shared  Assessment:  Possible reflux Eczematous changes with possible mild cellulitis  Plan:  Stop the peroxide to the skin.  Bactroban cream bid, cold cream prn CT scan deferred by patient until March  Ellen Perotti, MD

## 2014-11-09 ENCOUNTER — Telehealth: Payer: Self-pay | Admitting: *Deleted

## 2014-11-09 NOTE — Telephone Encounter (Signed)
Pt called stating her pharmacy stated that they can switch her mupirocin cream (Bactroban) 2% to a steroid.  Pt states she is not sure of this choice and would like Dr. Carlean Jews to give his opinion on this.  She states she has not picked up the medication.

## 2014-11-10 ENCOUNTER — Other Ambulatory Visit: Payer: Self-pay

## 2014-11-10 MED ORDER — TRIAMCINOLONE ACETONIDE 0.1 % EX CREA: 1.0000 "application " | TOPICAL_CREAM | Freq: Two times a day (BID) | CUTANEOUS | Status: DC

## 2014-11-10 NOTE — Telephone Encounter (Signed)
Let's try the steroid cream I have prescribed.  Let me know by the weekend how it is working

## 2014-11-10 NOTE — Telephone Encounter (Signed)
Left a detailed message for pt, giving instructions from [name].

## 2014-11-11 ENCOUNTER — Ambulatory Visit: Payer: Medicare Other | Admitting: Family Medicine

## 2014-12-02 ENCOUNTER — Encounter: Payer: Self-pay | Admitting: Family Medicine

## 2014-12-02 ENCOUNTER — Ambulatory Visit (INDEPENDENT_AMBULATORY_CARE_PROVIDER_SITE_OTHER): Payer: Medicare Other | Admitting: Family Medicine

## 2014-12-02 VITALS — BP 148/74 | HR 88 | Temp 98.4°F | Resp 16 | Ht 64.0 in | Wt 205.6 lb

## 2014-12-02 DIAGNOSIS — E538 Deficiency of other specified B group vitamins: Secondary | ICD-10-CM

## 2014-12-02 DIAGNOSIS — R42 Dizziness and giddiness: Secondary | ICD-10-CM | POA: Diagnosis not present

## 2014-12-02 DIAGNOSIS — R21 Rash and other nonspecific skin eruption: Secondary | ICD-10-CM | POA: Diagnosis not present

## 2014-12-02 DIAGNOSIS — N644 Mastodynia: Secondary | ICD-10-CM | POA: Diagnosis not present

## 2014-12-02 MED ORDER — CYANOCOBALAMIN 1000 MCG/ML IJ SOLN
1000.0000 ug | Freq: Once | INTRAMUSCULAR | Status: AC
  Administered 2014-12-02: 1000 ug via INTRAMUSCULAR

## 2014-12-02 NOTE — Progress Notes (Signed)
Subjective:  This chart was scribed for [name], MD by Molli Posey, Medical scribe. This patient was seen in ROOM 24 and the patient's care was started 4:19 PM.   Patient ID: Ellen Johnson, female    DOB: 1936-05-02, 79 y.o.   MRN: [id number]   Chief Complaint  Patient presents with   skin cancer removal    discuss - per patient scan   Injections    vitamin B12   Dizziness   HPI HPI Comments: Ellen Johnson is a 79 y.o. female with a history of HTN, GERD and HA who presents to [facility] complaining of recent dizziness. Pt reports that the skin that was removed from her left breast for skin cancer has been healing well. She says that she wants a scan of her left breast because she has been experiencing pain in that area. Pt reports she has a rash on her left hip as well. She says she wants a vitamin B12 shot today as well. She reports no exacerbating or modifying factors at this time.   Patient Active Problem List   Diagnosis Date Noted   DIVERTICULOSIS OF COLON 07/30/2008   DYSPHAGIA UNSPECIFIED 07/30/2008   ABDOMINAL PAIN, LEFT LOWER QUADRANT 07/30/2008   ANEMIA-NOS 05/11/2008   HYPERTENSION, SYSTOLIC, BORDERLINE 03/49/1791   GERD 05/11/2008   HEADACHE 05/11/2008   COLONIC POLYPS, HX OF 11/28/2007   Past Medical History  Diagnosis Date   GERD (gastroesophageal reflux disease)    Current Outpatient Prescriptions on File Prior to Visit  Medication Sig Dispense Refill   acetaminophen (TYLENOL) 500 MG tablet Take 500 mg by mouth every 6 (six) hours as needed.     acidophilus (RISAQUAD) CAPS capsule Take by mouth daily.     calcium carbonate 200 MG capsule Take 250 mg by mouth 2 (two) times daily with a meal.     cholecalciferol (VITAMIN D) 1000 UNITS tablet Take 1,000 Units by mouth daily.     fish oil-omega-3 fatty acids 1000 MG capsule Take 2 g by mouth daily.     furosemide (LASIX) 20 MG tablet TAKE 1 TABLET BY MOUTH EVERY DAY 30 tablet 1    HYDROcodone-acetaminophen (NORCO/VICODIN) 5-325 MG per tablet   0   Multiple Vitamin (MULTIVITAMIN) tablet Take 1 tablet by mouth daily.     mupirocin cream (BACTROBAN) 2 % Apply 1 application topically 2 (two) times daily. 15 g 0   omeprazole (PRILOSEC) 40 MG capsule Take 1 capsule (40 mg total) by mouth daily. 30 capsule 1   promethazine (PHENERGAN) 25 MG tablet Take 1 tablet (25 mg total) by mouth every 8 (eight) hours as needed for nausea or vomiting. 20 tablet 0   traMADol (ULTRAM) 50 MG tablet Take 1 tablet (50 mg total) by mouth every 6 (six) hours as needed for pain. 30 tablet 1   triamcinolone cream (KENALOG) 0.1 % Apply 1 application topically 2 (two) times daily. 30 g 0   Current Facility-Administered Medications on File Prior to Visit  Medication Dose Route Frequency Provider Last Rate Last Dose   cyanocobalamin ((VITAMIN B-12)) injection 1,000 mcg  1,000 mcg Intramuscular Once [name], MD       Allergies  Allergen Reactions   Valium [Diazepam] Other (See Comments)    Totally different personality   Past Surgical History  Procedure Laterality Date   Abdominal hysterectomy     Gall bladder removed     Fracture surgery     Family History  Problem Relation  Age of Onset   Stroke Mother    Cancer Father    Cancer Maternal Grandmother    History   Social History   Marital Status: Widowed    Spouse Name: N/A   Number of Children: N/A   Years of Education: N/A   Occupational History   Not on file.   Social History Main Topics   Smoking status: Never Smoker    Smokeless tobacco: Never Used   Alcohol Use: Yes   Drug Use: Not on file   Sexual Activity: Not on file   Other Topics Concern   Not on file   Social History Narrative    Review of Systems  Skin: Positive for rash.  Neurological: Positive for dizziness.  All other systems reviewed and are negative.     Objective:   Physical Exam  Constitutional: She is oriented to  person, place, and time. She appears well-developed and well-nourished.  HENT:  Head: Normocephalic and atraumatic.  Eyes: Right eye exhibits no discharge. Left eye exhibits no discharge.  Neck: Neck supple. No tracheal deviation present.  Cardiovascular: Normal rate.   Pulmonary/Chest: Effort normal. No respiratory distress.  Abdominal: She exhibits no distension.  Neurological: She is alert and oriented to person, place, and time.  Skin: Skin is warm and dry.  2x2cm rash on left hip.   Psychiatric: She has a normal mood and affect.  Nursing note and vitals reviewed.  Filed Vitals:   12/02/14 1620  BP: 148/74  Pulse: 88  Temp: 98.4 F (36.9 C)  TempSrc: Oral  Resp: 16  Height: 5\' 4"  (1.626 m)  Weight: 205 lb 9.6 oz (93.26 kg)  SpO2: 96%      Assessment & Plan:   This chart was scribed in my presence and reviewed by me personally.    ICD-9-CM ICD-10-CM   1. Skin rash 782.1 R21   2. Vitamin B12 deficiency 266.2 E53.8 cyanocobalamin ((VITAMIN B-12)) injection 1,000 mcg  3. Dizziness and giddiness 780.4 R42   4. Breast tenderness in female 611.71 N64.4 MR Breast Left W Wo Contrast     MR Breast Left W Wo Contrast     Signed, [name], MD

## 2014-12-02 NOTE — Patient Instructions (Signed)
Call Ellen Johnson for the [facility] retrospective at 4 pm on April 3rd

## 2014-12-30 ENCOUNTER — Encounter: Payer: Self-pay | Admitting: Family Medicine

## 2014-12-30 ENCOUNTER — Ambulatory Visit (INDEPENDENT_AMBULATORY_CARE_PROVIDER_SITE_OTHER): Payer: Medicare Other | Admitting: Family Medicine

## 2014-12-30 ENCOUNTER — Other Ambulatory Visit: Payer: Self-pay | Admitting: Family Medicine

## 2014-12-30 VITALS — BP 158/81 | HR 94 | Temp 98.4°F | Resp 16 | Ht 63.5 in | Wt 210.0 lb

## 2014-12-30 DIAGNOSIS — R21 Rash and other nonspecific skin eruption: Secondary | ICD-10-CM | POA: Diagnosis not present

## 2014-12-30 DIAGNOSIS — E538 Deficiency of other specified B group vitamins: Secondary | ICD-10-CM

## 2014-12-30 MED ORDER — CYANOCOBALAMIN 1000 MCG/ML IJ SOLN
1000.0000 ug | INTRAMUSCULAR | Status: DC
  Administered 2014-12-30: 1000 ug via INTRAMUSCULAR

## 2014-12-30 NOTE — Patient Instructions (Signed)
I have referred you to dermatology for the rash.

## 2014-12-30 NOTE — Progress Notes (Signed)
° °  Subjective:  This chart was scribed for [name], MD by Randa Evens, ED Scribe. This patient was seen in room 26 and the patient's care was started at 4:41 PM.   Patient ID: Ellen Johnson, female    DOB: [birth year], 79 y.o.   MRN: [id number]  Chief Complaint  Patient presents with   Follow-up   Referral    dermatology. left foot/ankle    HPI HPI Comments: Ellen Johnson is a 79 y.o. female who presents to the [facility] for left hip follow up and dermatologist referral. Pt states that her hip is improving and denies any other complaints. Pt states that her small left ankle rash is not clearing up. Pt states that she is washes it with peroxide nightly with no relief. Pt is also requesting B12 injection.   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   Review of Systems  Skin: Positive for rash.     Objective:   BP 158/81 mmHg   Pulse 94   Temp(Src) 98.4 F (36.9 C)   Resp 16   Ht 5' 3.5" (1.613 m)   Wt 210 lb (95.255 kg)   BMI 36.61 kg/m2   SpO2 93%    Physical Exam  Constitutional: She is oriented to person, place, and time. She appears well-developed and well-nourished. No distress.  HENT:  Head: Normocephalic and atraumatic.  Eyes: Conjunctivae and EOM are normal.  Neck: Neck supple. No tracheal deviation present.  Cardiovascular: Normal rate.   Pulmonary/Chest: Effort normal. No respiratory distress.  Musculoskeletal: Normal range of motion.  Neurological: She is alert and oriented to person, place, and time.  Skin: Skin is warm and dry.  Thickened red plaque lateral left malleolus  Psychiatric: She has a normal mood and affect. Her behavior is normal.  Nursing note and vitals reviewed.    Assessment & Plan:   This chart was scribed in my presence and reviewed by me personally.    ICD-9-CM ICD-10-CM   1. Rash and nonspecific skin eruption 782.1 R21 Ambulatory referral to Dermatology  2. Vitamin B12 deficiency 266.2 E53.8  cyanocobalamin ((VITAMIN B-12)) injection 1,000 mcg     Signed, [name], MD

## 2014-12-30 NOTE — Telephone Encounter (Signed)
Do you want to give RFs of this and/or larger tube?

## 2015-01-19 ENCOUNTER — Telehealth: Payer: Self-pay

## 2015-01-19 NOTE — Telephone Encounter (Signed)
Dr L? Don't see MRI ordered? Was this something you were going to do?

## 2015-01-19 NOTE — Telephone Encounter (Signed)
I don't recall this either.  Please ask her what is bothering her.

## 2015-01-19 NOTE — Telephone Encounter (Signed)
Pt states that there should have been an mri put in by dr Joseph Art

## 2015-01-20 ENCOUNTER — Other Ambulatory Visit: Payer: Self-pay | Admitting: Family Medicine

## 2015-01-20 DIAGNOSIS — R1033 Periumbilical pain: Secondary | ICD-10-CM

## 2015-01-20 NOTE — Telephone Encounter (Signed)
Spoke with pt, she states she has been nauseated and cramping for two days. She states this was talked about 3 weeks ago in the office visit. I believe she is asking for a CT of the abdomen, she is scared of cancer.

## 2015-01-20 NOTE — Telephone Encounter (Signed)
Left message for pt to call back  °

## 2015-01-22 ENCOUNTER — Telehealth: Payer: Self-pay

## 2015-01-22 NOTE — Telephone Encounter (Signed)
Dr Carlean JewsJuluis Rainier

## 2015-01-22 NOTE — Telephone Encounter (Signed)
The patient called to let [name] know that she is still very sick, but is unable to drive and has no one to bring her. The patient states that she is having severe lower abdominal pain and will come to be seen by [name] on Monday between 8-1 pm.  The patient may be reached at [phone].

## 2015-01-25 ENCOUNTER — Ambulatory Visit (INDEPENDENT_AMBULATORY_CARE_PROVIDER_SITE_OTHER): Payer: Medicare Other | Admitting: Family Medicine

## 2015-01-25 VITALS — BP 132/80 | HR 74 | Temp 98.1°F | Resp 18 | Ht 64.0 in | Wt 212.0 lb

## 2015-01-25 DIAGNOSIS — R300 Dysuria: Secondary | ICD-10-CM | POA: Diagnosis not present

## 2015-01-25 DIAGNOSIS — K5732 Diverticulitis of large intestine without perforation or abscess without bleeding: Secondary | ICD-10-CM

## 2015-01-25 DIAGNOSIS — R1032 Left lower quadrant pain: Secondary | ICD-10-CM

## 2015-01-25 LAB — POCT UA - MICROSCOPIC ONLY
Bacteria, U Microscopic: NEGATIVE
Casts, Ur, LPF, POC: NEGATIVE
Crystals, Ur, HPF, POC: NEGATIVE
Mucus, UA: NEGATIVE
RBC, urine, microscopic: NEGATIVE
Yeast, UA: NEGATIVE

## 2015-01-25 LAB — POCT URINALYSIS DIPSTICK
Bilirubin, UA: NEGATIVE
Glucose, UA: NEGATIVE
Leukocytes, UA: NEGATIVE
Nitrite, UA: NEGATIVE
Protein, UA: NEGATIVE
Spec Grav, UA: 1.02
Urobilinogen, UA: 1
pH, UA: 6.5

## 2015-01-25 MED ORDER — AMOXICILLIN-POT CLAVULANATE 875-125 MG PO TABS
1.0000 | ORAL_TABLET | Freq: Two times a day (BID) | ORAL | Status: DC
Start: 2015-01-25 — End: 2015-03-16

## 2015-01-25 NOTE — Progress Notes (Signed)
Subjective:    Patient ID: Ellen Johnson, female    DOB: [birth year], 79 y.o.   MRN: [id number] This chart was scribed for [name], MD by Marti Sleigh, Medical Scribe. This patient was seen in Room 10 and the patient's care was started a 5:52 PM.  Chief Complaint  Patient presents with   Abdominal Cramping    HPI HPI Comments: Ellen Johnson is a 79 y.o. female who presents to [facility] complaining of lower abdominal pain for the last six days. Pt states that six days ago she had cramping after eating, which continued and worsened over the two following days and then resolved on the third day. Pt denies blood in stool or fever. Pt end orses assocaited chills.   Pt also states she is experiencing dysuria.   Pt also states she has been constipated, and has been using suppositories with some relief of sx.  Review of Systems  Constitutional: Positive for chills. Negative for fever.  Gastrointestinal: Positive for abdominal pain. Negative for anal bleeding.  Genitourinary: Positive for dysuria. Negative for frequency.       Objective:   Physical Exam  Constitutional: She is oriented to person, place, and time. She appears well-developed and well-nourished. No distress.  HENT:  Head: Normocephalic and atraumatic.  Eyes: Pupils are equal, round, and reactive to light.  Neck: Neck supple.  Cardiovascular: Normal rate.   Pulmonary/Chest: Effort normal. No respiratory distress.  Abdominal:  Tender in LLQ and suprapubic region, without any guarding or mass  Musculoskeletal: Normal range of motion.  Neurological: She is alert and oriented to person, place, and time. Coordination normal.  Skin: Skin is warm and dry. She is not diaphoretic.  Psychiatric: She has a normal mood and affect. Her behavior is normal.  Nursing note and vitals reviewed.  Results for orders placed or performed in visit on 05/27/14  CBC with Differential  Result Value Ref Range   WBC 10.0 4.0 - 10.5  K/uL   RBC 4.70 3.87 - 5.11 MIL/uL   Hemoglobin 13.3 12.0 - 15.0 g/dL   HCT 39.0 36.0 - 46.0 %   MCV 83.0 78.0 - 100.0 fL   MCH 28.3 26.0 - 34.0 pg   MCHC 34.1 30.0 - 36.0 g/dL   RDW 15.2 11.5 - 15.5 %   Platelets 262 150 - 400 K/uL   Neutrophils Relative % 70 43 - 77 %   Neutro Abs 7.0 1.7 - 7.7 K/uL   Lymphocytes Relative 20 12 - 46 %   Lymphs Abs 2.0 0.7 - 4.0 K/uL   Monocytes Relative 8 3 - 12 %   Monocytes Absolute 0.8 0.1 - 1.0 K/uL   Eosinophils Relative 2 0 - 5 %   Eosinophils Absolute 0.2 0.0 - 0.7 K/uL   Basophils Relative 0 0 - 1 %   Basophils Absolute 0.0 0.0 - 0.1 K/uL   Smear Review Criteria for review not met   Comprehensive metabolic panel  Result Value Ref Range   Sodium 137 135 - 145 mEq/L   Potassium 4.4 3.5 - 5.3 mEq/L   Chloride 103 96 - 112 mEq/L   CO2 26 19 - 32 mEq/L   Glucose, Bld 102 (H) 70 - 99 mg/dL   BUN 12 6 - 23 mg/dL   Creat 0.79 0.50 - 1.10 mg/dL   Total Bilirubin 0.4 0.2 - 1.2 mg/dL   Alkaline Phosphatase 83 39 - 117 U/L   AST 18 0 - 37 U/L  ALT 16 0 - 35 U/L   Total Protein 6.7 6.0 - 8.3 g/dL   Albumin 4.4 3.5 - 5.2 g/dL   Calcium 10.1 8.4 - 10.5 mg/dL   Results for orders placed or performed in visit on 01/25/15  POCT urinalysis dipstick  Result Value Ref Range   Color, UA yellow    Clarity, UA clear    Glucose, UA neg    Bilirubin, UA neg    Ketones, UA trace    Spec Grav, UA 1.020    Blood, UA trace-intact    pH, UA 6.5    Protein, UA neg    Urobilinogen, UA 1.0    Nitrite, UA neg    Leukocytes, UA Negative   POCT UA - Microscopic Only  Result Value Ref Range   WBC, Ur, HPF, POC 0-1    RBC, urine, microscopic neg    Bacteria, U Microscopic neg    Mucus, UA neg    Epithelial cells, urine per micros 0-1    Crystals, Ur, HPF, POC neg    Casts, Ur, LPF, POC neg    Yeast, UA neg         Assessment & Plan:   This chart was scribed in my presence and reviewed by me personally.    ICD-9-CM ICD-10-CM   1. LLQ pain  789.04 R10.32 POCT urinalysis dipstick     POCT UA - Microscopic Only     Urine culture     amoxicillin-clavulanate (AUGMENTIN) 875-125 MG per tablet  2. Dysuria 788.1 R30.0 POCT urinalysis dipstick     POCT UA - Microscopic Only     Urine culture  3. Diverticulitis of colon 562.11 K57.32 amoxicillin-clavulanate (AUGMENTIN) 875-125 MG per tablet     Signed, [name], MD

## 2015-01-25 NOTE — Patient Instructions (Signed)

## 2015-01-26 ENCOUNTER — Telehealth: Payer: Self-pay

## 2015-01-26 NOTE — Telephone Encounter (Signed)
Patient's previous request for her CT Abdomen Pelvis w contrast expired and I entered a new one today. It is under medical review and requires more clinical information. Patients ID #[id number] Group # [id number] DOB Aug 03, 1936 case #[id number] contact number is [phone]. Please contact to give more information thank you

## 2015-01-27 LAB — URINE CULTURE

## 2015-01-28 NOTE — Telephone Encounter (Signed)
Called pt, I wanted to see if she was doing ok. [name] I may need your help on this when you come back in to get authorization.

## 2015-01-28 NOTE — Telephone Encounter (Signed)
Patient returned missed phone call. Please call back.

## 2015-02-01 ENCOUNTER — Other Ambulatory Visit: Payer: Self-pay | Admitting: Family Medicine

## 2015-02-01 DIAGNOSIS — R1032 Left lower quadrant pain: Secondary | ICD-10-CM

## 2015-02-01 MED ORDER — PROMETHAZINE HCL 25 MG PO TABS: 25.0000 mg | ORAL_TABLET | Freq: Three times a day (TID) | ORAL | Status: DC | PRN

## 2015-02-01 NOTE — Addendum Note (Signed)
Addended by: Jannette Spanner on: 02/01/2015 04:24 PM   Modules accepted: Orders

## 2015-02-01 NOTE — Telephone Encounter (Signed)
Pt is not any better, She has been trying everything and wants a Rx for phenergen sent in due to the nausea from the ABX. Can we get together and call the insurance.

## 2015-02-01 NOTE — Telephone Encounter (Signed)
Spoke with [name], Rx for phenergen sent. Dr l. Put in an order for gastroenterologist.

## 2015-02-01 NOTE — Telephone Encounter (Signed)
Left message for pt to call back  °

## 2015-02-03 NOTE — Telephone Encounter (Signed)
Left message for pt to call back  °

## 2015-02-03 NOTE — Telephone Encounter (Signed)
Spoke with pt, advised Rx was sent and she stated she has an appt with Dr. Benson Norway.

## 2015-02-23 ENCOUNTER — Telehealth: Payer: Self-pay

## 2015-02-23 DIAGNOSIS — K5732 Diverticulitis of large intestine without perforation or abscess without bleeding: Secondary | ICD-10-CM

## 2015-02-23 DIAGNOSIS — R1032 Left lower quadrant pain: Secondary | ICD-10-CM

## 2015-02-23 NOTE — Telephone Encounter (Signed)
Patient is calling to let us and [name] know that the got a letter stating that the insurance will cover the procedure. Please call patient to get more details. She states that we've been trying to reach her and this has to be done by July 24th. Patient phone:  [phone]

## 2015-02-23 NOTE — Telephone Encounter (Signed)
Spoke with pt, she answered the phone and stated she had to call me back tomorrow.

## 2015-03-07 NOTE — Telephone Encounter (Signed)
Patient would like a call back from [place] anytime after 12

## 2015-03-08 NOTE — Telephone Encounter (Signed)
Spoke with pt, she needs a CT scan ordered per Dr. Benson Norway. She is wants the scan to be scheduled on any day except Monday and wednesday. We were going to order this but held off on it. Can we order CT?

## 2015-03-08 NOTE — Telephone Encounter (Signed)
Yes, the CT can be ordered. Thank you.

## 2015-03-09 NOTE — Telephone Encounter (Signed)
Left VM informing pt CT has been ordered.

## 2015-03-16 ENCOUNTER — Ambulatory Visit (INDEPENDENT_AMBULATORY_CARE_PROVIDER_SITE_OTHER): Payer: Medicare Other | Admitting: Family Medicine

## 2015-03-16 VITALS — BP 150/80 | HR 78 | Temp 97.8°F | Resp 16 | Ht 63.5 in | Wt 205.8 lb

## 2015-03-16 DIAGNOSIS — R1030 Lower abdominal pain, unspecified: Secondary | ICD-10-CM | POA: Diagnosis not present

## 2015-03-16 DIAGNOSIS — Z8719 Personal history of other diseases of the digestive system: Secondary | ICD-10-CM

## 2015-03-16 MED ORDER — AMOXICILLIN-POT CLAVULANATE 875-125 MG PO TABS: 1.0000 | ORAL_TABLET | Freq: Two times a day (BID) | ORAL | Status: DC

## 2015-03-16 NOTE — Progress Notes (Signed)
Subjective:    Patient ID: Ellen Johnson, female    DOB: 10/26/1935, 79 y.o.   MRN: [id number]  Chief Complaint  Patient presents with  . Abdominal Pain   Patient Active Problem List   Diagnosis Date Noted  . DIVERTICULOSIS OF COLON 07/30/2008  . DYSPHAGIA UNSPECIFIED 07/30/2008  . ABDOMINAL PAIN, LEFT LOWER QUADRANT 07/30/2008  . ANEMIA-NOS 05/11/2008  . HYPERTENSION, SYSTOLIC, BORDERLINE 27/74/1287  . GERD 05/11/2008  . HEADACHE 05/11/2008  . COLONIC POLYPS, HX OF 11/28/2007   Prior to Admission medications   Medication Sig Start Date End Date Taking? Authorizing Provider  acidophilus (RISAQUAD) CAPS capsule Take by mouth daily.   Yes Historical Provider, MD  calcium carbonate 200 MG capsule Take 250 mg by mouth 2 (two) times daily with a meal.   Yes Historical Provider, MD  cholecalciferol (VITAMIN D) 1000 UNITS tablet Take 1,000 Units by mouth daily.   Yes Historical Provider, MD  famotidine (PEPCID) 10 MG tablet Take 10 mg by mouth 2 (two) times daily.   Yes Historical Provider, MD  fish oil-omega-3 fatty acids 1000 MG capsule Take 2 g by mouth daily.   Yes Historical Provider, MD  furosemide (LASIX) 20 MG tablet TAKE 1 TABLET BY MOUTH EVERY DAY 07/12/14  Yes [name], MD  Multiple Vitamin (MULTIVITAMIN) tablet Take 1 tablet by mouth daily.   Yes Historical Provider, MD  omeprazole (PRILOSEC) 40 MG capsule Take 40 mg by mouth daily.   Yes Historical Provider, MD  triamcinolone cream (KENALOG) 0.1 % APPLY 1 APPLICATION TOPICALLY 2 (TWO) TIMES DAILY. 12/30/14  Yes [name], MD  amoxicillin-clavulanate (AUGMENTIN) 875-125 MG per tablet Take 1 tablet by mouth 2 (two) times daily. 03/16/15   Araceli Bouche, PA   Medications, allergies, past medical history, surgical history, family history, social history and problem list reviewed and updated.  HPI  33 yof presents with abd pain.   Sx started approx 2 wks ago. She has been getting lower abd cramping approx 30-45  mins after meals for past 2 wks. After majority of meals. She sometimes gets this cramping not assoc with meals but is most freq after meals. Lasts approx 1-2 hrs then slowly resolves. She typically has a normal bm after each episode of abd cramping. No diarrhea.   Denies fevers, chills. The pain decreases with omeprazole. She denies any epigastric pain. Denies n/v. She is currently without abd pain.   Denies dysuria, increased freq, urgency, vaginal dc or odor.   She has had diverticulitis before. Per pt she has a GI physician and has freq colonoscopies for a chronic colon condition. She has no hx colon ca.   Pt has a CT abd/pelvis that is scheduled for tomorrow at 130 pm. This has been previously scheduled from a prior diverticulitis episode.   Review of Systems See HPI.     Objective:   Physical Exam  Constitutional: She is oriented to person, place, and time. She appears well-developed and well-nourished.  Non-toxic appearance. She does not have a sickly appearance. She does not appear ill. No distress.  BP 150/80 mmHg  Pulse 78  Temp(Src) 97.8 F (36.6 C) (Oral)  Resp 16  Ht 5' 3.5" (1.613 m)  Wt 205 lb 12.8 oz (93.35 kg)  BMI 35.88 kg/m2  SpO2 98%   Cardiovascular: Normal rate, regular rhythm and normal heart sounds.   Pulmonary/Chest: Effort normal and breath sounds normal. No tachypnea.  Abdominal: Soft. Normal appearance and bowel sounds are normal. There  is tenderness in the right lower quadrant, suprapubic area and left lower quadrant. There is no rigidity, no rebound, no guarding, no CVA tenderness, no tenderness at McBurney's point and negative Murphy's sign.  Moderate ttp across lower abdomen. No rebound. No guarding. No peritoneal signs.   Neurological: She is alert and oriented to person, place, and time.  Psychiatric: She has a normal mood and affect. Her speech is normal and behavior is normal.      Assessment & Plan:   48 yof presents with abd pain.   Lower  abdominal pain - Plan: amoxicillin-clavulanate (AUGMENTIN) 875-125 MG per tablet History of diverticulitis - Plan: amoxicillin-clavulanate (AUGMENTIN) 875-125 MG per tablet --unsure of etiology --diverticulitis vs possible mesenteric ischemia --pt stable at this time with normal vitals, no abd pain other then with palpation --pt will go home tonight and plan to proceed with already scheduled ct abd/pelvis tomorrow at 130 pm --start augmentin bid for possible diverticulitis --er overnight if sx worsen, pt agreeable  Julieta Gutting, PA-C Physician Assistant-Certified [facility]  03/16/2015 9:44 PM

## 2015-03-16 NOTE — Progress Notes (Signed)
Seen with [name] and agree with his plan of care.  Explained to pt that unfortunately our POC CBC machine is down so we are not able to get this additional information.  Her belly is slightly tender but not an acute abdomen.  Offered further eval at the hospital tonight but she declines, will plan to have her CT scan tomorrow.  We will watch for this result.

## 2015-03-16 NOTE — Patient Instructions (Addendum)
You could have a number of things causing your pain including diverticulitis or decreased blood flow to the intestines.  Please be sure to have your CT scan tomorrow at 1:30 as scheduled.  If the pain increases tonight or tomorrow morning prior to the scan please go to the ER asap!  Please take the augmentin twice daily for 10 days.

## 2015-03-17 ENCOUNTER — Other Ambulatory Visit: Payer: Self-pay

## 2015-03-18 ENCOUNTER — Telehealth: Payer: Self-pay | Admitting: Family Medicine

## 2015-03-18 NOTE — Telephone Encounter (Signed)
Called her as her CT has not shown up- she reports that it was moved to this coming Tuesday so she can use a type of contrast protocol that is easier for her.  However she also reports that she is feeling much better and is ok for now.  Will watch for her CT

## 2015-03-22 ENCOUNTER — Ambulatory Visit
Admission: RE | Admit: 2015-03-22 | Discharge: 2015-03-22 | Disposition: A | Discharge status: Home / Self Care | Payer: Medicare Other | Source: Ambulatory Visit | Attending: Urgent Care | Admitting: Urgent Care

## 2015-03-22 ENCOUNTER — Telehealth: Payer: Self-pay | Admitting: Family Medicine

## 2015-03-22 DIAGNOSIS — K5732 Diverticulitis of large intestine without perforation or abscess without bleeding: Secondary | ICD-10-CM

## 2015-03-22 DIAGNOSIS — R1032 Left lower quadrant pain: Secondary | ICD-10-CM

## 2015-03-22 MED ORDER — IOPAMIDOL (ISOVUE-300) INJECTION 61%
125.0000 mL | Freq: Once | INTRAVENOUS | Status: AC | PRN
  Administered 2015-03-22: 125 mL via INTRAVENOUS

## 2015-03-22 NOTE — Telephone Encounter (Signed)
-----   Message from Jaynee Eagles, [place] sent at 03/22/2015  8:15 PM EDT ----- Regarding: CT for patient Hey Dr. Lorelei Pont, please let me know if I can help you with this patient. The CT result came to me.  Thanks! Ronalee Belts ----- Message -----    From: Rad Results In Interface    Sent: 03/22/2015   6:24 PM      To: Jaynee Eagles, PA-C

## 2015-03-22 NOTE — Telephone Encounter (Signed)
Called but she did not answer- we had put her on augmentin for possible diverticulitis at last OV.  LMOM that I was calling to see how she is feeling, will try again tomorrow

## 2015-03-23 ENCOUNTER — Telehealth: Payer: Self-pay | Admitting: Family Medicine

## 2015-03-23 NOTE — Telephone Encounter (Signed)
-----   Message from Jaynee Eagles, [place] sent at 03/22/2015  8:15 PM EDT ----- Regarding: CT for patient Hey Dr. Lorelei Pont, please let me know if I can help you with this patient. The CT result came to me.  Thanks! Ronalee Belts ----- Message -----    From: Rad Results In Interface    Sent: 03/22/2015   6:24 PM      To: Jaynee Eagles, PA-C

## 2015-03-23 NOTE — Telephone Encounter (Signed)
Called again- cell no answer.   LMOM at home- asked her to call back if she can, I will try her again

## 2015-03-28 ENCOUNTER — Telehealth: Payer: Self-pay | Admitting: Family Medicine

## 2015-03-28 NOTE — Telephone Encounter (Signed)
Called to check on her progress. unfortunately her sx seem to be returning.  She is still on abx- she plans to come in and see Dr. Carlean Jews in the next couple of days

## 2015-04-19 DIAGNOSIS — H18412 Arcus senilis, left eye: Secondary | ICD-10-CM | POA: Diagnosis not present

## 2015-04-19 DIAGNOSIS — H02839 Dermatochalasis of unspecified eye, unspecified eyelid: Secondary | ICD-10-CM | POA: Diagnosis not present

## 2015-04-19 DIAGNOSIS — H2511 Age-related nuclear cataract, right eye: Secondary | ICD-10-CM | POA: Diagnosis not present

## 2015-04-19 DIAGNOSIS — H18411 Arcus senilis, right eye: Secondary | ICD-10-CM | POA: Diagnosis not present

## 2015-04-21 ENCOUNTER — Telehealth: Payer: Self-pay | Admitting: Family Medicine

## 2015-04-21 NOTE — Telephone Encounter (Signed)
-----   Message from Jaynee Eagles, [place] sent at 03/22/2015  8:15 PM EDT ----- Regarding: CT for patient Hey Dr. Lorelei Pont, please let me know if I can help you with this patient. The CT result came to me.  Thanks! Ronalee Belts ----- Message -----    From: Rad Results In Interface    Sent: 03/22/2015   6:24 PM      To: Jaynee Eagles, PA-C

## 2015-04-21 NOTE — Telephone Encounter (Signed)
Called and LMOM- we never did see her back for a recheck.  Just calling to make sure she is doing well, let us know if not-  JC

## 2015-04-28 ENCOUNTER — Telehealth: Payer: Self-pay

## 2015-04-28 ENCOUNTER — Other Ambulatory Visit: Payer: Self-pay | Admitting: Physician Assistant

## 2015-04-28 NOTE — Telephone Encounter (Signed)
Patient is calling because Dr. Lorelei Pont prescribed an antibiotic that she still hasn't picked up and the pharmacy needs a call to confirm it's okay. Pharmacy is [facility] on [address]

## 2015-04-29 NOTE — Telephone Encounter (Signed)
Called and left a message on the VM for the patient to call back to see why she is needing these abx now.  These were prescribed for her back in July, and she never picked these up.

## 2015-06-02 ENCOUNTER — Encounter: Payer: Self-pay | Admitting: Family Medicine

## 2015-06-02 ENCOUNTER — Ambulatory Visit (INDEPENDENT_AMBULATORY_CARE_PROVIDER_SITE_OTHER): Payer: Medicare Other | Admitting: Family Medicine

## 2015-06-02 VITALS — BP 138/88 | HR 82 | Temp 98.5°F | Resp 12 | Wt 204.0 lb

## 2015-06-02 DIAGNOSIS — R21 Rash and other nonspecific skin eruption: Secondary | ICD-10-CM | POA: Diagnosis not present

## 2015-06-02 DIAGNOSIS — Z23 Encounter for immunization: Secondary | ICD-10-CM | POA: Diagnosis not present

## 2015-06-02 DIAGNOSIS — E538 Deficiency of other specified B group vitamins: Secondary | ICD-10-CM

## 2015-06-02 MED ORDER — CYANOCOBALAMIN 1000 MCG/ML IJ SOLN
1000.0000 ug | INTRAMUSCULAR | Status: DC
  Administered 2015-06-02 – 2015-07-27 (×2): 1000 ug via INTRAMUSCULAR

## 2015-06-02 MED ORDER — MUPIROCIN 2 % EX OINT: 1.0000 "application " | TOPICAL_OINTMENT | Freq: Two times a day (BID) | CUTANEOUS | Status: DC

## 2015-06-02 MED ORDER — TRIAMCINOLONE ACETONIDE 0.1 % EX CREA: TOPICAL_CREAM | CUTANEOUS | Status: DC

## 2015-06-02 NOTE — Patient Instructions (Signed)
Please let me know if the Bactroban cream doesn't clear up the rash in the next week. Need to stop taking Neosporin for that because of the potential for allergic reaction.
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
## 2015-06-03 ENCOUNTER — Telehealth: Payer: Self-pay

## 2015-06-03 DIAGNOSIS — R110 Nausea: Secondary | ICD-10-CM

## 2015-06-03 MED ORDER — PROMETHAZINE HCL 25 MG PO TABS: 25.0000 mg | ORAL_TABLET | Freq: Three times a day (TID) | ORAL | Status: DC | PRN

## 2015-06-03 NOTE — Telephone Encounter (Signed)
Pt stated she got the flu shot here yesterday here. It made her very sick, so she would like that script. Please advise at [phone]

## 2015-06-03 NOTE — Telephone Encounter (Signed)
[place] sent to pharmacy.

## 2015-06-03 NOTE — Telephone Encounter (Signed)
Need to know what pt wants phenergan for. [name] has prescribed phenergan a few times for her to take with abx since they make her nauseated. Is there something new going on that she needs phenergan for?

## 2015-06-03 NOTE — Telephone Encounter (Signed)
Can we Rx phenergen?

## 2015-06-03 NOTE — Telephone Encounter (Signed)
Left message on pt's voicemail letting her know phenergen was sent in.

## 2015-06-03 NOTE — Telephone Encounter (Signed)
Patient is calling because she forget to tell the provider that she would like a refill for phenagin

## 2015-06-07 ENCOUNTER — Telehealth: Payer: Self-pay

## 2015-06-07 NOTE — Telephone Encounter (Signed)
Pt is wanting to let dr Joseph Art know that the rash is better but a lot

## 2015-06-14 DIAGNOSIS — C44519 Basal cell carcinoma of skin of other part of trunk: Secondary | ICD-10-CM | POA: Diagnosis not present

## 2015-06-14 DIAGNOSIS — L821 Other seborrheic keratosis: Secondary | ICD-10-CM | POA: Diagnosis not present

## 2015-06-14 DIAGNOSIS — L570 Actinic keratosis: Secondary | ICD-10-CM | POA: Diagnosis not present

## 2015-06-14 DIAGNOSIS — L309 Dermatitis, unspecified: Secondary | ICD-10-CM | POA: Diagnosis not present

## 2015-06-14 DIAGNOSIS — C44619 Basal cell carcinoma of skin of left upper limb, including shoulder: Secondary | ICD-10-CM | POA: Diagnosis not present

## 2015-06-14 DIAGNOSIS — Z85828 Personal history of other malignant neoplasm of skin: Secondary | ICD-10-CM | POA: Diagnosis not present

## 2015-06-14 DIAGNOSIS — D485 Neoplasm of uncertain behavior of skin: Secondary | ICD-10-CM | POA: Diagnosis not present

## 2015-06-20 ENCOUNTER — Other Ambulatory Visit: Payer: Self-pay | Admitting: Family Medicine

## 2015-06-21 ENCOUNTER — Telehealth: Payer: Self-pay | Admitting: *Deleted

## 2015-06-21 NOTE — Telephone Encounter (Signed)
Is patient out of Bactroban?  Does she need refills?

## 2015-06-21 NOTE — Telephone Encounter (Signed)
Dr. Michell Heinrich states her rash isn't any better with using Bactroban,  She states she is going to come in Friday to be seen again. But wanted to know if something could be called in before then?

## 2015-06-24 ENCOUNTER — Ambulatory Visit (INDEPENDENT_AMBULATORY_CARE_PROVIDER_SITE_OTHER): Payer: Medicare Other | Admitting: Family Medicine

## 2015-06-24 VITALS — BP 132/76 | HR 84 | Temp 98.3°F | Resp 16 | Ht 65.0 in | Wt 210.0 lb

## 2015-06-24 DIAGNOSIS — L03119 Cellulitis of unspecified part of limb: Secondary | ICD-10-CM | POA: Diagnosis not present

## 2015-06-24 DIAGNOSIS — C44521 Squamous cell carcinoma of skin of breast: Secondary | ICD-10-CM | POA: Diagnosis not present

## 2015-06-24 DIAGNOSIS — I776 Arteritis, unspecified: Secondary | ICD-10-CM

## 2015-06-24 MED ORDER — AMOXICILLIN-POT CLAVULANATE 875-125 MG PO TABS
1.0000 | ORAL_TABLET | Freq: Two times a day (BID) | ORAL | Status: DC
Start: 2015-06-24 — End: 2015-07-16

## 2015-06-24 MED ORDER — PREDNISONE 10 MG PO TABS: 10.0000 mg | ORAL_TABLET | Freq: Every day | ORAL | Status: DC

## 2015-06-24 NOTE — Progress Notes (Addendum)
° °  Subjective:    Patient ID: Ellen Johnson, female    DOB: 1936/08/16, 79 y.o.   MRN: [id number] This chart was scribed for [name], MD by Zola Button, Medical Scribe. This patient was seen in Room 11 and the patient's care was started at 4:52 PM.   HPI HPI Comments: Ellen Johnson is a 79 y.o. female who presents to the [facility] complaining of painful, pruritic rash to her bilateral lower extremities that started a month ago. Patient notes that the topical medications I prescribed 3 weeks ago have not been effective; she is still having pain and itching to her lower legs and is now spreading to her feet. She has also tried peroxide without relief.  Patient would also like to discuss skin cancer. She had basal cell carcinoma removed from her left upper breast by me on 07/29/14. She did see a specialist recently and was recommended to have a Mohs surgery, but she is wondering is she should have a second opinion.  Review of Systems  Skin: Positive for rash.       Objective:   Physical Exam CONSTITUTIONAL: Well developed/well nourished HEAD: Normocephalic/atraumatic EYES: EOM/PERRL ENMT: Mucous membranes moist NECK: supple no meningeal signs SPINE: entire spine nontender CV: S1/S2 noted, no murmurs/rubs/gallops noted LUNGS: Lungs are clear to auscultation bilaterally, no apparent distress ABDOMEN: soft, nontender, no rebound or guarding GU: no cva tenderness NEURO: Pt is awake/alert, moves all extremitiesx4 EXTREMITIES: pulses normal, full ROM. Erythematous lateral malleolus of the left ankle surrounding ulceration. No edema, but there is warmth. SKIN: warm, multiple small ulcerations on both lower extremities consistent with a vascular inflammation.  Left breast shows scaly erythema around the biopsy site which is healing slowly. PSYCH: no abnormalities of mood noted        Assessment & Plan:   By signing my name below, I, Zola Button, attest  that this documentation has been prepared under the direction and in the presence of [name], MD.  Electronically Signed: Zola Button, Medical Scribe. 06/24/2015. 4:51 PM.  This chart was scribed in my presence and reviewed by me personally.    ICD-9-CM ICD-10-CM   1. Cellulitis of lower extremity, unspecified laterality 682.6 L03.119 amoxicillin-clavulanate (AUGMENTIN) 875-125 MG tablet  2. Vasculitis (HCC) 447.6 I77.6 predniSONE (DELTASONE) 10 MG tablet  3. Squamous cell skin cancer, breast, female 173.52 C45.521    I've asks patient to have her dermatologist call me regarding options for the breast cancer surgery. Patient is to call me if her legs are not clearing in the next several days.  Signed, [name], MD

## 2015-06-24 NOTE — Patient Instructions (Signed)
My cell phone is [phone]    Vasculitis Vasculitis is swelling (inflammation) of the blood vessels. With vasculitis, the blood vessels can become thick, narrow, scarred, or weak, and enough blood may not be able to flow through them. This can cause damage to the muscles, kidneys, lungs, brain, and other parts of the body.  There are many types of vasculitis. Some last only a short time while others last a long time. CAUSES  The exact cause is unknown, but vasculitis can develop when the body's immune system attacks its own blood vessels. This attack can be caused by:  An infection.  An immune system disease, such as lupus, rheumatoid arthritis, or scleroderma.  An allergic reaction to a medicine.  Cancer that affects blood cells, such as leukemia and lymphoma. RISK FACTORS  Being a smoker.  Being under stress.  Having a physical injury. SIGNS AND SYMPTOMS  Symptoms vary depending on the type of vasculitis you have. Symptoms that are common to all types of vasculitis include:  Fever.  Poor appetite.  Weight loss.  Feeling very tired.  Having aches and pains.  Weakness.  Numbness in an area of your body. Symptoms for specific types of vasculitis include:  Skin problems, such as sores, spots, or rashes.  Trouble seeing.  Trouble breathing.  Blood in your urine.  Headaches.  Stomach pain.  Stuffy or bloody nose. DIAGNOSIS  Your health care provider will ask about your symptoms and do a physical exam. You may have tests done, such as:  A complete blood count (CBC).  Erythrocyte sedimentation, also called sed rate test.  C-reactive protein (CRP).  Antineutrophil cytoplasmic antibodies (ANCA).  A urine test.  A biopsy of a blood vessel.  A nerve conduction study.  Imaging tests, such as:  X-rays.  A CT scan.  An ultrasound.  An MRI.  Angiography. TREATMENT  Treatment will depend on the type of vasculitis you have and how severe it is.  Sometimes treatment is not needed. Treatment often includes:  Medicines.  Physical therapy or occupational therapy. This helps strengthen muscles that were weakened by the disease. You will need to see your health care provider while you are being treated. During follow-up visits, your health care provider will:  Perform blood tests and bone density tests.  Check your blood pressure and blood sugar.  Check for side effects of any medicines you are taking. Vasculitis cannot always be cured. Sometimes symptoms go away but the disease does not (the disease goes in remission). If symptoms return, increased treatment may be needed. HOME CARE INSTRUCTIONS   Take medicines only as directed by your health care provider.  Keep all follow-up visits as directed by your health care provider. This is important.  Exercise. Talk with your health care provider about what exercises are okay for you to do. Usually exercises that increase your heart rate (aerobic exercise), such as walking, are recommended. Aerobic exercise helps control your blood pressure and prevent bone loss.  Follow a healthy diet. Include healthy sources of protein, fruits, vegetables, and whole grains in your diet.  Learn as much as you can about vasculitis, and consider joining a support group. Understanding your condition and talking with others who have it may help you cope. Talk with your health care provider if you feel stressed, anxious, or depressed. SEEK MEDICAL CARE IF:   Your symptoms return, or you have new symptoms.  Your fever, fatigue, headache, or weight loss gets worse.  You have signs of  infection, such as fever, warmth, tenderness, redness, or swelling. SEEK IMMEDIATE MEDICAL CARE IF:   Your vision gets worse.  Your pain does not go away, even after you take pain medicine.  You have chest or stomach pain.  You have trouble breathing.  One side of your face or body suddenly becomes weak or numb.  Your  nose bleeds.  There is blood in your urine. MAKE SURE YOU:  Understand these instructions.  Will watch your condition.  Will get help right away if you are not doing well or get worse.   This information is not intended to replace advice given to you by your health care provider. Make sure you discuss any questions you have with your health care provider.   Document Released: 06/23/2009 Document Revised: 09/17/2014 Document Reviewed: 10/21/2013 Elsevier Interactive Patient Education [employer].

## 2015-06-28 ENCOUNTER — Telehealth: Payer: Self-pay

## 2015-06-28 NOTE — Telephone Encounter (Signed)
Dr Carlean Jews, Merdis Delay is calling you from the [facility] concerning pt in response to a req she had to call you. Please call her back on her cell # [phone]. It looks like (from your OV notes) you had wanted to talk to pt's dermatologist concerning breast surgery options.

## 2015-06-30 NOTE — Telephone Encounter (Signed)
Dr. Carlean Jews  Patient is requesting a call from you regarding the conversation you had with Dr. Merdis Delay.   Please call  [phone] (M)

## 2015-07-01 NOTE — Telephone Encounter (Signed)
Patient is calling to see if [name] has responded to her previous message. Patient states that she would like to hear back from [name] by Monday

## 2015-07-04 ENCOUNTER — Telehealth: Payer: Self-pay

## 2015-07-04 NOTE — Telephone Encounter (Signed)
PT IS NEEDING DR LAUENSTEIN TO [facility]

## 2015-07-05 NOTE — Telephone Encounter (Signed)
Pt will come in tomorrow to see [name]. SHe is hoping to see you because she has to advise on if she will have this surgery. She will be here before 3 and I advised her that it may not be enough time to get in to see him. Dr. Loma Sender

## 2015-07-05 NOTE — Telephone Encounter (Signed)
Left message for pt to call back  °

## 2015-07-07 ENCOUNTER — Telehealth: Payer: Self-pay | Admitting: Family Medicine

## 2015-07-07 NOTE — Telephone Encounter (Signed)
Dr. Carlean Jews  Please Advise

## 2015-07-07 NOTE — Telephone Encounter (Signed)
Please have patient return for reevaluation on Friday

## 2015-07-07 NOTE — Telephone Encounter (Signed)
Ellen Johnson called saying she finished her Rx of Prednisone yesterday and she's still itching and has rashes on her hands, feet, and legs. She said she's miserable and is wondering what she needs to do. She's supposed to go out of town and can't go like this according to her. She's wondering if a refill needs to be sent to her pharmacy or if she needs to come back in. Please give Ellen Johnson a phone call.  Pt ph# [phone] Thank you.

## 2015-07-08 NOTE — Telephone Encounter (Signed)
LMOM to RTC. 

## 2015-07-13 ENCOUNTER — Telehealth: Payer: Self-pay | Admitting: Family Medicine

## 2015-07-13 MED ORDER — TRIAMCINOLONE ACETONIDE 0.1 % EX CREA: TOPICAL_CREAM | CUTANEOUS | Status: DC

## 2015-07-13 NOTE — Telephone Encounter (Signed)
Patient states that she would really like a refill on her cream and she will come in on Saturday to see Dr. Carlean Jews. States that her itching is getting really bad.  [phone]

## 2015-07-13 NOTE — Telephone Encounter (Signed)
Please stop the amoxicillin product

## 2015-07-14 ENCOUNTER — Telehealth: Payer: Self-pay | Admitting: Family Medicine

## 2015-07-14 NOTE — Telephone Encounter (Signed)
LMOM to stop Amoxicillin

## 2015-07-14 NOTE — Telephone Encounter (Signed)
LMOM to stop Amoxicillin.

## 2015-07-16 ENCOUNTER — Ambulatory Visit (INDEPENDENT_AMBULATORY_CARE_PROVIDER_SITE_OTHER): Payer: Medicare Other | Admitting: Family Medicine

## 2015-07-16 ENCOUNTER — Encounter: Payer: Self-pay | Admitting: Family Medicine

## 2015-07-16 VITALS — BP 128/68 | HR 84 | Temp 98.1°F | Resp 20 | Ht 63.7 in | Wt 199.0 lb

## 2015-07-16 DIAGNOSIS — R21 Rash and other nonspecific skin eruption: Secondary | ICD-10-CM | POA: Diagnosis not present

## 2015-07-16 DIAGNOSIS — R110 Nausea: Secondary | ICD-10-CM | POA: Diagnosis not present

## 2015-07-16 MED ORDER — ONDANSETRON 8 MG PO TBDP: 8.0000 mg | ORAL_TABLET | Freq: Three times a day (TID) | ORAL | Status: DC | PRN

## 2015-07-16 MED ORDER — METHYLPREDNISOLONE ACETATE 80 MG/ML IJ SUSP
80.0000 mg | Freq: Once | INTRAMUSCULAR | Status: AC
  Administered 2015-07-16: 80 mg via INTRAMUSCULAR

## 2015-07-16 NOTE — Addendum Note (Signed)
Addended by: [name] on: 07/16/2015 04:43 PM   Modules accepted: Orders

## 2015-07-16 NOTE — Progress Notes (Signed)
This chart was scribed for [name], MD by Moises Blood, medical scribe at [facility].The patient was seen in exam room 6 and the patient's care was started at 4:24 PM.  Patient ID: Ellen Johnson MRN: [id number], DOB: Sep 23, 1935, 79 y.o. Date of Encounter: 07/16/2015  Primary Physician: [name], MD  Chief Complaint:  Chief Complaint  Patient presents with   Shortness of Breath   Dizziness   Nausea    HPI:  Ellen Johnson is a 79 y.o. female who presents to [facility] complaining of shortness of breath with dizziness and nausea. She noticed a rash in both of her legs and both arms with burning in her left foot. She noticed the rash more after wearing her watch one day. She was taking antibiotics, but has stopped a week ago. She's been taking omeprazole for 10 years. Her pharmacist informed her to have blood work done as it could be a drug allergy. She's been applying anti-itching cream for diaper rash with temporary relief on the affected areas.   Past Medical History  Diagnosis Date   GERD (gastroesophageal reflux disease)      Home Meds: Prior to Admission medications   Medication Sig Start Date End Date Taking? Authorizing Provider  acidophilus (RISAQUAD) CAPS capsule Take by mouth daily.    Historical Provider, MD  amoxicillin-clavulanate (AUGMENTIN) 875-125 MG tablet Take 1 tablet by mouth 2 (two) times daily. 06/24/15   [name], MD  calcium carbonate 200 MG capsule Take 250 mg by mouth 2 (two) times daily with a meal.    Historical Provider, MD  cholecalciferol (VITAMIN D) 1000 UNITS tablet Take 1,000 Units by mouth daily.    Historical Provider, MD  fish oil-omega-3 fatty acids 1000 MG capsule Take 2 g by mouth daily.    Historical Provider, MD  furosemide (LASIX) 20 MG tablet TAKE 1 TABLET BY MOUTH EVERY DAY 07/12/14   [name], MD  Multiple Vitamin (MULTIVITAMIN) tablet Take 1 tablet by mouth daily.     Historical Provider, MD  mupirocin ointment (BACTROBAN) 2 % USE 1 APPLICATION INTO THE NOSE 2 (TWO) TIMES DAILY. 06/22/15   [name], MD  omeprazole (PRILOSEC) 40 MG capsule Take 40 mg by mouth daily.    Historical Provider, MD  predniSONE (DELTASONE) 10 MG tablet Take 1 tablet (10 mg total) by mouth daily with breakfast. 06/24/15   [name], MD  promethazine (PHENERGAN) 25 MG tablet Take 1 tablet (25 mg total) by mouth every 8 (eight) hours as needed for nausea or vomiting. 06/03/15   Ezekiel Slocumb, PA-C  triamcinolone cream (KENALOG) 0.1 % APPLY 1 APPLICATION TOPICALLY 2 (TWO) TIMES DAILY. 07/13/15   [name], MD  vitamin C (ASCORBIC ACID) 500 MG tablet Take 500 mg by mouth daily. Takes only during winter months    Historical Provider, MD    Allergies:  Allergies  Allergen Reactions   Valium [Diazepam] Other (See Comments)    Totally different personality    Social History   Social History   Marital Status: Widowed    Spouse Name: N/A   Number of Children: N/A   Years of Education: N/A   Occupational History   Not on file.   Social History Main Topics   Smoking status: Never Smoker    Smokeless tobacco: Never Used   Alcohol Use: Yes   Drug Use: Not on file   Sexual Activity: Not on file   Other  Topics Concern   Not on file   Social History Narrative     Review of Systems: Constitutional: negative for fever, chills, night sweats, weight changes, or fatigue  HEENT: negative for vision changes, hearing loss, congestion, rhinorrhea, ST, epistaxis, or sinus pressure Cardiovascular: negative for chest pain or palpitations Respiratory: negative for hemoptysis, wheezing, or cough; positive for shortness of breath Abdominal: negative for abdominal pain, vomiting, diarrhea, or constipation; positive for nausea Dermatological: negative for rash Neurologic: negative for headache, or syncope; positive for dizziness All other systems reviewed and  are otherwise negative with the exception to those above and in the HPI.  Physical Exam: There were no vitals taken for this visit., There is no weight on file to calculate BMI. General: Well developed, well nourished, in no acute distress. Head: Normocephalic, atraumatic, eyes without discharge, sclera non-icteric, nares are without discharge. Bilateral auditory canals clear, TM's are without perforation, pearly grey and translucent with reflective cone of light bilaterally. Oral cavity moist, posterior pharynx without exudate, erythema, peritonsillar abscess, or post nasal drip.  Neck: Supple. No thyromegaly. Full ROM. No lymphadenopathy. Lungs: Clear bilaterally to auscultation without wheezes, rales, or rhonchi. Breathing is unlabored. Heart: RRR with S1 S2. No murmurs, rubs, or gallops appreciated. Abdomen: Soft, non-tender, non-distended with normoactive bowel sounds. No hepatomegaly. No rebound/guarding. No obvious abdominal masses. Msk:  Strength and tone normal for age. Extremities/Skin: Warm and dry. Vesicular rash over her distal extremities  Neuro: Alert and oriented X 3. Moves all extremities spontaneously. Gait is normal. CNII-XII grossly in tact. Psych:  Responds to questions appropriately with a normal affect.   4:29PM depo medrol shot given   ASSESSMENT AND PLAN:  79 y.o. year old female with  Progressive  vesicular rash on her extremities with unremitting and intolerable pruritus.  This chart was scribed in my presence and reviewed by me personally.    ICD-9-CM ICD-10-CM   1. Rash and nonspecific skin eruption 782.1 R21 CBC with Differential/Platelet     COMPLETE METABOLIC PANEL WITH GFR     Sedimentation rate     C-reactive protein     Rocky mtn spotted fvr ab, IgM-blood     Ambulatory referral to Dermatology     methylPREDNISolone acetate (DEPO-MEDROL) injection 80 mg     I'm going to do everything I can to get this patient in to see Dr. Denna Haggard or another  dermatologist on Monday By signing my name below, I, Moises Blood, attest that this documentation has been prepared under the direction and in the presence of [name], MD. Electronically Signed: Moises Blood, Scribe. 07/16/2015 , 4:24 PM .  Signed, [name], MD 07/16/2015 4:24 PM

## 2015-07-17 LAB — CBC WITH DIFFERENTIAL/PLATELET
Basophils Absolute: 0.1 10*3/uL (ref 0.0–0.1)
Basophils Relative: 1 % (ref 0–1)
Eosinophils Absolute: 0.2 10*3/uL (ref 0.0–0.7)
Eosinophils Relative: 4 % (ref 0–5)
HCT: 36.8 % (ref 36.0–46.0)
Hemoglobin: 12.4 g/dL (ref 12.0–15.0)
Lymphocytes Relative: 29 % (ref 12–46)
Lymphs Abs: 1.8 10*3/uL (ref 0.7–4.0)
MCH: 27.5 pg (ref 26.0–34.0)
MCHC: 33.7 g/dL (ref 30.0–36.0)
MCV: 81.6 fL (ref 78.0–100.0)
MPV: 9.5 fL (ref 8.6–12.4)
Monocytes Absolute: 0.6 10*3/uL (ref 0.1–1.0)
Monocytes Relative: 9 % (ref 3–12)
Neutro Abs: 3.5 10*3/uL (ref 1.7–7.7)
Neutrophils Relative %: 57 % (ref 43–77)
Platelets: 262 10*3/uL (ref 150–400)
RBC: 4.51 MIL/uL (ref 3.87–5.11)
RDW: 14.9 % (ref 11.5–15.5)
WBC: 6.2 10*3/uL (ref 4.0–10.5)

## 2015-07-17 LAB — COMPLETE METABOLIC PANEL WITH GFR
ALT: 12 U/L (ref 6–29)
AST: 16 U/L (ref 10–35)
Albumin: 3.8 g/dL (ref 3.6–5.1)
Alkaline Phosphatase: 70 U/L (ref 33–130)
BUN: 12 mg/dL (ref 7–25)
CO2: 24 mmol/L (ref 20–31)
Calcium: 8.9 mg/dL (ref 8.6–10.4)
Chloride: 105 mmol/L (ref 98–110)
Creat: 0.71 mg/dL (ref 0.60–0.93)
GFR, Est African American: >89 mL/min (ref >=60)
GFR, Est Non African American: 83 mL/min (ref >=60)
Glucose, Bld: 122 mg/dL — ABNORMAL HIGH (ref 65–99)
Potassium: 3.9 mmol/L (ref 3.5–5.3)
Sodium: 140 mmol/L (ref 135–146)
Total Bilirubin: 0.5 mg/dL (ref 0.2–1.2)
Total Protein: 6.2 g/dL (ref 6.1–8.1)

## 2015-07-17 LAB — SEDIMENTATION RATE: Sed Rate: 12 mm/hr (ref 0–30)

## 2015-07-17 LAB — C-REACTIVE PROTEIN: CRP: 0.5 mg/dL (ref <0.60)

## 2015-07-18 ENCOUNTER — Telehealth: Payer: Self-pay

## 2015-07-18 LAB — ROCKY MTN SPOTTED FVR AB, IGM-BLOOD: ROCKY MTN SPOTTED FEVER, IGM: 0.14 IV

## 2015-07-18 NOTE — Telephone Encounter (Signed)
Patient stated that she saw Dr. Carlean Jews and Dr. Lorelei Pont the other day and she would like for someone to call her about her referral to get her left breast exam done, stated she has not heard anything and would like to know what is going on with it.  Her call back number is [phone]

## 2015-07-19 NOTE — Telephone Encounter (Signed)
Myself and Fanny Skates, have reviewed the last several messages and office notes. Looks like back in October he placed a referral to Dermatology to discuss breast options. Also in October there was a phone message from [facility] see message: Merdis Delay is calling you from the [facility] concerning pt in response to a req she had to call you. Please call her back on her cell # [phone]. It looks like (from your OV notes) you had wanted to talk to pt's dermatologist concerning breast surgery options.  I am not sure what needs to be done. It doesn't look like a mammogram has been ordered and according to his notes that didn't seem to be his plan. Please advise

## 2015-07-19 NOTE — Telephone Encounter (Signed)
Please create a new order for the patient to get a mammogram.  I believe it was entered incorrectly; it doesn't show up on the scheduled orders for me to send to an imaging facility.

## 2015-07-27 ENCOUNTER — Ambulatory Visit (INDEPENDENT_AMBULATORY_CARE_PROVIDER_SITE_OTHER): Payer: Medicare Other | Admitting: Family Medicine

## 2015-07-27 VITALS — BP 138/74 | HR 85 | Temp 98.1°F | Resp 18 | Ht 64.0 in | Wt 209.0 lb

## 2015-07-27 DIAGNOSIS — E538 Deficiency of other specified B group vitamins: Secondary | ICD-10-CM

## 2015-07-27 DIAGNOSIS — L03119 Cellulitis of unspecified part of limb: Secondary | ICD-10-CM | POA: Diagnosis not present

## 2015-07-27 NOTE — Progress Notes (Signed)
@[email or web address] @  This chart was scribed for [name], MD by Thea Alken, ED Scribe. This patient was seen in room 11 and the patient's care was started at 6:09 PM.  Patient ID: Ellen Johnson MRN: [id number], DOB: [birth year], 79 y.o. Date of Encounter: 07/27/2015, 6:09 PM  Primary Physician: [name], MD  Chief Complaint:  Chief Complaint  Patient presents with  . Rash    Ankles, and knees  . B12 Injection    HPI: 79 y.o. year old female with history below presents with an itchy, red rash to bilateral legs, ankles and feet. She was seen here 11 days ago for rash. States rash has been keeping her awake at night. She has been using diaper rash cream and cortisone cream without relief. She has an appointment with Dermatology, Dr. Denna Haggard, in a couple weeks.   Pt also needs B12 injection. 79 .  Past Medical History  Diagnosis Date  . GERD (gastroesophageal reflux disease)      Home Meds: Prior to Admission medications   Medication Sig Start Date End Date Taking? Authorizing Provider  calcium carbonate 200 MG capsule Take 250 mg by mouth 2 (two) times daily with a meal.   Yes Historical Provider, MD  cholecalciferol (VITAMIN D) 1000 UNITS tablet Take 1,000 Units by mouth daily.   Yes Historical Provider, MD  fish oil-omega-3 fatty acids 1000 MG capsule Take 2 g by mouth daily.   Yes Historical Provider, MD  furosemide (LASIX) 20 MG tablet TAKE 1 TABLET BY MOUTH EVERY DAY 07/12/14  Yes [name], MD  Ginkgo Biloba 40 MG TABS Take by mouth.   Yes Historical Provider, MD  Multiple Vitamin (MULTIVITAMIN) tablet Take 1 tablet by mouth daily.   Yes Historical Provider, MD  omeprazole (PRILOSEC) 40 MG capsule Take 40 mg by mouth daily.   Yes Historical Provider, MD  ondansetron (ZOFRAN-ODT) 8 MG disintegrating tablet Take 1 tablet (8 mg total) by mouth every 8 (eight) hours as needed for nausea. 07/16/15  Yes [name], MD  triamcinolone cream (KENALOG) 0.1 % APPLY 1  APPLICATION TOPICALLY 2 (TWO) TIMES DAILY. 07/13/15  Yes [name], MD  vitamin C (ASCORBIC ACID) 500 MG tablet Take 500 mg by mouth daily. Takes only during winter months   Yes Historical Provider, MD  acidophilus (RISAQUAD) CAPS capsule Take by mouth daily.    Historical Provider, MD  mupirocin ointment (BACTROBAN) 2 % USE 1 APPLICATION INTO THE NOSE 2 (TWO) TIMES DAILY. Patient not taking: Reported on 07/27/2015 06/22/15   [name], MD    Allergies:  Allergies  Allergen Reactions  . Valium [Diazepam] Other (See Comments)    Totally different personality    Social History   Social History  . Marital Status: Widowed    Spouse Name: N/A  . Number of Children: N/A  . Years of Education: N/A   Occupational History  . Not on file.   Social History Main Topics  . Smoking status: Never Smoker   . Smokeless tobacco: Never Used  . Alcohol Use: Yes  . Drug Use: Not on file  . Sexual Activity: Not on file   Other Topics Concern  . Not on file   Social History Narrative     Review of Systems: Constitutional: negative for chills, fever, night sweats, weight changes, or fatigue  HEENT: negative for vision changes, hearing loss, congestion, rhinorrhea, ST, epistaxis, or sinus pressure Cardiovascular: negative for chest pain or palpitations Respiratory: negative for hemoptysis, wheezing, shortness of breath,  or cough Abdominal: negative for abdominal pain, nausea, vomiting, diarrhea, or constipation Dermatological: negative for rash Neurologic: negative for headache, dizziness, or syncope All other systems reviewed and are otherwise negative with the exception to those above and in the HPI.   Physical Exam: Blood pressure 138/74, pulse 85, temperature 98.1 F (36.7 C), temperature source Oral, resp. rate 18, height 5\' 4"  (1.626 m), weight 209 lb (94.802 kg), SpO2 94 %., Body mass index is 35.86 kg/(m^2). General: Well developed, well nourished, in no acute  distress. Head: Normocephalic, atraumatic, eyes without discharge, sclera non-icteric, nares are without discharge. Bilateral auditory canals clear, TM's are without perforation, pearly grey and translucent with reflective cone of light bilaterally. Oral cavity moist, posterior pharynx without exudate, erythema, peritonsillar abscess, or post nasal drip.  Neck: Supple. No thyromegaly. Full ROM. No lymphadenopathy. Msk:  Strength and tone normal for age. Extremities/Skin: Warm and dry.  No edema but patient has the pruritic excoriated rash on her  Shins and over her ankles as well as between her fingers. Neuro: Alert and oriented X 3. Moves all extremities spontaneously. Gait is normal. CNII-XII grossly in tact. Psych:  Responds to questions appropriately with a normal affect.     ASSESSMENT AND PLAN:  79 y.o. year old female with  This chart was scribed in my presence and reviewed by me personally.    ICD-9-CM ICD-10-CM   1. B12 deficiency 266.2 E53.8   2. Cellulitis of lower extremity, unspecified laterality 682.6 L03.119     Dermatology consultation tomorrow   Signed, [name], MD 07/27/2015 6:09 PM

## 2015-07-28 ENCOUNTER — Encounter: Payer: Self-pay | Admitting: Dermatology

## 2015-07-28 DIAGNOSIS — Z79899 Other long term (current) drug therapy: Secondary | ICD-10-CM | POA: Diagnosis not present

## 2015-07-28 DIAGNOSIS — L239 Allergic contact dermatitis, unspecified cause: Secondary | ICD-10-CM | POA: Diagnosis not present

## 2015-07-28 DIAGNOSIS — L308 Other specified dermatitis: Secondary | ICD-10-CM | POA: Diagnosis not present

## 2015-07-28 DIAGNOSIS — L209 Atopic dermatitis, unspecified: Secondary | ICD-10-CM | POA: Diagnosis not present

## 2015-07-28 DIAGNOSIS — L309 Dermatitis, unspecified: Secondary | ICD-10-CM | POA: Diagnosis not present

## 2015-07-28 DIAGNOSIS — D485 Neoplasm of uncertain behavior of skin: Secondary | ICD-10-CM | POA: Diagnosis not present

## 2015-08-02 DIAGNOSIS — L309 Dermatitis, unspecified: Secondary | ICD-10-CM | POA: Diagnosis not present

## 2015-08-05 ENCOUNTER — Ambulatory Visit (INDEPENDENT_AMBULATORY_CARE_PROVIDER_SITE_OTHER): Payer: Medicare Other | Admitting: Family Medicine

## 2015-08-05 VITALS — BP 136/72 | HR 91 | Temp 98.0°F | Resp 18 | Ht 64.0 in | Wt 210.4 lb

## 2015-08-05 DIAGNOSIS — L308 Other specified dermatitis: Secondary | ICD-10-CM | POA: Diagnosis not present

## 2015-08-05 DIAGNOSIS — R21 Rash and other nonspecific skin eruption: Secondary | ICD-10-CM

## 2015-08-05 DIAGNOSIS — L299 Pruritus, unspecified: Secondary | ICD-10-CM | POA: Diagnosis not present

## 2015-08-05 MED ORDER — HYDROXYZINE HCL 25 MG PO TABS: ORAL_TABLET | ORAL | Status: DC

## 2015-08-05 NOTE — Progress Notes (Signed)
Procedure: Verbal consent obtained. Skin cleaned with alcohol and anesthetized with 2% lido with epi. Sterile field applied. 2 mm punch biopsy obtained from left ankle and 3 mm punch biopsy obtained from right ankle. drysol applied for hemostasis. Wound dressed and wound care discussed.

## 2015-08-05 NOTE — Patient Instructions (Addendum)
Avoid anything that has perfumes or dyes on your skin I would recommend that you try cetaphil or vaseline on your skin to help moisturize Stop taking the baths- take just a quick shower and pat dry skin Use the vistaril as needed for itching I will be in touch with your biopsy report On Monday I will try and get you another dermatology appt  Let us know if anything is changing in the meantime- I hope that you feel better

## 2015-08-05 NOTE — Progress Notes (Signed)
[facility] [address] [place] [address] [phone]  Date:  08/05/2015   Name:  Ellen Johnson   DOB:  08/24/1936   MRN:  [id number]  PCP:  [name], MD    Chief Complaint: Follow-up   History of Present Illness:  Ellen Johnson is a 79 y.o. very pleasant female patient who presents with the following:  Here today to follow-up on a skin problem - she was first seen for this on 11/5, see note from that day.  She was noted to have a progressive vesicular rash and severe pruritis.  She was given a shot of depo-medrol and we tried to get her in to see dermatology asap-   She came back on 11/16 with the rash again- noted itchy, red rash on bilateral legs, ankles and feet.  She was set up to see Derm the next day.  She saw Dr. Denna Haggard- she has seen him twice but states that they do not know what is causing her rash. She has started dapsone 25 mg daily.   She called Dr. Denna Haggard and he will see her on Monday.  However she is getting frustrated and would like to see a 2nd opinion  She is scratching a lot. The scratching is really "making me crazy, I'm not sleeping." The shot of steroids did not help.  She is taking 2 benadryl at night.  This does help her some.  She is using oatmeal baths, and epson salts.    Her main concern is itching.  She did take benadryl last night, and another 50 mg at 2pm today.  Otherwise she has generally been in good health. She cannot think of any changes in her routine or exposures that could have brought this on At this point the rash is scaly and dry on her legs, and she has more like confluent hives on her arms.  She started to break out on her face today so she came in to be seen  Patient Active Problem List   Diagnosis Date Noted  . DIVERTICULOSIS OF COLON 07/30/2008  . DYSPHAGIA UNSPECIFIED 07/30/2008  . ABDOMINAL PAIN, LEFT LOWER QUADRANT 07/30/2008  . ANEMIA-NOS 05/11/2008  . HYPERTENSION, SYSTOLIC, BORDERLINE  0000000  . GERD 05/11/2008  . HEADACHE 05/11/2008  . COLONIC POLYPS, HX OF 11/28/2007    Past Medical History  Diagnosis Date  . GERD (gastroesophageal reflux disease)     Past Surgical History  Procedure Laterality Date  . Abdominal hysterectomy    . Gall bladder removed    . Fracture surgery      Social History  Substance Use Topics  . Smoking status: Never Smoker   . Smokeless tobacco: Never Used  . Alcohol Use: Yes    Family History  Problem Relation Age of Onset  . Stroke Mother   . Cancer Father   . Cancer Maternal Grandmother     Allergies  Allergen Reactions  . Valium [Diazepam] Other (See Comments)    Totally different personality    Medication list has been reviewed and updated.  Current Outpatient Prescriptions on File Prior to Visit  Medication Sig Dispense Refill  . acidophilus (RISAQUAD) CAPS capsule Take by mouth daily.    . calcium carbonate 200 MG capsule Take 250 mg by mouth 2 (two) times daily with a meal.    . cholecalciferol (VITAMIN D) 1000 UNITS tablet Take 1,000 Units by mouth daily.    . fish oil-omega-3 fatty acids 1000 MG  capsule Take 2 g by mouth daily.    . furosemide (LASIX) 20 MG tablet TAKE 1 TABLET BY MOUTH EVERY DAY 30 tablet 1  . Ginkgo Biloba 40 MG TABS Take by mouth.    . Multiple Vitamin (MULTIVITAMIN) tablet Take 1 tablet by mouth daily.    Marland Kitchen omeprazole (PRILOSEC) 40 MG capsule Take 40 mg by mouth daily.    . vitamin C (ASCORBIC ACID) 500 MG tablet Take 500 mg by mouth daily. Takes only during winter months    . mupirocin ointment (BACTROBAN) 2 % USE 1 APPLICATION INTO THE NOSE 2 (TWO) TIMES DAILY. (Patient not taking: Reported on 07/27/2015) 22 g 0  . ondansetron (ZOFRAN-ODT) 8 MG disintegrating tablet Take 1 tablet (8 mg total) by mouth every 8 (eight) hours as needed for nausea. (Patient not taking: Reported on 08/05/2015) 30 tablet 0  . triamcinolone cream (KENALOG) 0.1 % APPLY 1 APPLICATION TOPICALLY 2 (TWO) TIMES  DAILY. (Patient not taking: Reported on 08/05/2015) 30 g 3   Current Facility-Administered Medications on File Prior to Visit  Medication Dose Route Frequency Provider Last Rate Last Dose  . cyanocobalamin ((VITAMIN B-12)) injection 1,000 mcg  1,000 mcg Intramuscular Once [name], MD      . cyanocobalamin ((VITAMIN B-12)) injection 1,000 mcg  1,000 mcg Intramuscular Q30 days [name], MD   1,000 mcg at 07/27/15 1824    Review of Systems:  As per HPI- otherwise negative.   Physical Examination: Filed Vitals:   08/05/15 1710  BP: 136/72  Pulse: 91  Temp: 98 F (36.7 C)  Resp: 18   Filed Vitals:   08/05/15 1710  Height: 5\' 4"  (1.626 m)  Weight: 210 lb 6.4 oz (95.437 kg)   Body mass index is 36.1 kg/(m^2). Ideal Body Weight: Weight in (lb) to have BMI = 25: 145.3  GEN: WDWN, NAD, Non-toxic, A & O x 3, obese, appears uncomfortable HEENT: Atraumatic, Normocephalic. Neck supple. No masses, No LAD.  Bilateral TM wnl, oropharynx normal.  PEERL,EOMI.   Ears and Nose: No external deformity. CV: RRR, No M/G/R. No JVD. No thrill. No extra heart sounds. PULM: CTA B, no wheezes, crackles, rhonchi. No retractions. No resp. distress. No accessory muscle use. EXTR: No c/c/e NEURO Normal gait.  PSYCH: Normally interactive. Conversant. Not depressed or anxious appearing.  Calm demeanor.  She has a malar distribution rash on her face- it is pink and slightly puffy No angioedema Her arms display areas of what appear to be mild, confluent hives Her legs are excoriated.  The lower legs and ankles/ feet show scaly, dry appearing rash with some crusting and a small amount of weeping  Tissue bx taken- see note by Bennett Scrape PA-C Assessment and Plan: Rash and nonspecific skin eruption - Plan: Dermatology pathology  Itching - Plan: Dermatology pathology, hydrOXYzine (ATARAX/VISTARIL) 25 MG tablet  Here today with persistent rash and severe itching Recent LFTs normal She is not  getting relief from her itching- will try atarax as needed.  Cautioned regarding sedation Took tissue bx today Discussed strategies to soothe her skin Plan to call derm on Monday for her and try to get her a 2nd opinion as she requests Signed Lamar Blinks, MD

## 2015-08-08 ENCOUNTER — Telehealth: Payer: Self-pay | Admitting: Family Medicine

## 2015-08-08 NOTE — Telephone Encounter (Signed)
Called [facility] on her behalf- they did not have anything obviously available, but I LMOM with triage nurse.

## 2015-08-08 NOTE — Telephone Encounter (Signed)
Called [facility] again and again was sent to [place] pt in the evening and LM that I will continue to try.

## 2015-08-08 NOTE — Telephone Encounter (Signed)
Pt called to [facility]. Call back post-result review.  [phone]

## 2015-08-09 NOTE — Telephone Encounter (Signed)
Called [facility] again- could not get though. Had to [facility] again

## 2015-08-10 ENCOUNTER — Encounter: Payer: Self-pay | Admitting: Family Medicine

## 2015-08-10 ENCOUNTER — Ambulatory Visit (INDEPENDENT_AMBULATORY_CARE_PROVIDER_SITE_OTHER): Payer: Medicare Other | Admitting: Family Medicine

## 2015-08-10 VITALS — BP 126/77 | HR 86 | Temp 98.4°F | Resp 16 | Ht 62.75 in | Wt 206.4 lb

## 2015-08-10 DIAGNOSIS — L299 Pruritus, unspecified: Secondary | ICD-10-CM | POA: Diagnosis not present

## 2015-08-10 DIAGNOSIS — R21 Rash and other nonspecific skin eruption: Secondary | ICD-10-CM

## 2015-08-10 DIAGNOSIS — R208 Other disturbances of skin sensation: Secondary | ICD-10-CM

## 2015-08-10 MED ORDER — ACETAMINOPHEN-CODEINE #3 300-30 MG PO TABS: 1.0000 | ORAL_TABLET | Freq: Four times a day (QID) | ORAL | Status: DC | PRN

## 2015-08-10 NOTE — Progress Notes (Signed)
[facility] [address], [place] [address] [phone]  Date:  08/10/2015   Name:  Ellen Johnson   DOB:  March 11, 1936   MRN:  [id number]  PCP:  [name], MD    Chief Complaint: Follow-up and broken out over body   History of Present Illness:  Ellen Johnson is a 79 y.o. very pleasant female patient who presents with the following:  Here today to recheck a rash.  She was here on Friday and we did a Bx- this showed a spongiotic derm.  She had wanted a 2nd opinion from derm; I called [facility] several times and they did call her back yesterday  However she declined an appt yesterday.  It seems that there may be some confusion about her owing them money- per her recollection she has never been seen there, much less have run up a bill- we suspect that they may be confusing her with another pt with the same name.   At any rate she is here today because her rash continues to get worse. She is itching all over, "I can't sleep, this is driving me crazy."  No other sx, no fever, etc  States that hydroxyzine is not really helping with her itching and does not help her to sleep.  She has been intolerant to benzos in the past.   Patient Active Problem List   Diagnosis Date Noted  . DIVERTICULOSIS OF COLON 07/30/2008  . DYSPHAGIA UNSPECIFIED 07/30/2008  . ABDOMINAL PAIN, LEFT LOWER QUADRANT 07/30/2008  . ANEMIA-NOS 05/11/2008  . HYPERTENSION, SYSTOLIC, BORDERLINE 0000000  . GERD 05/11/2008  . HEADACHE 05/11/2008  . COLONIC POLYPS, HX OF 11/28/2007    Past Medical History  Diagnosis Date  . GERD (gastroesophageal reflux disease)     Past Surgical History  Procedure Laterality Date  . Abdominal hysterectomy    . Gall bladder removed    . Fracture surgery      Social History  Substance Use Topics  . Smoking status: Never Smoker   . Smokeless tobacco: Never Used  . Alcohol Use: Yes    Family History  Problem Relation Age of Onset  . Stroke  Mother   . Cancer Father   . Cancer Maternal Grandmother     Allergies  Allergen Reactions  . Valium [Diazepam] Other (See Comments)    Totally different personality    Medication list has been reviewed and updated.  Current Outpatient Prescriptions on File Prior to Visit  Medication Sig Dispense Refill  . acidophilus (RISAQUAD) CAPS capsule Take by mouth daily.    . calcium carbonate 200 MG capsule Take 250 mg by mouth 2 (two) times daily with a meal.    . cholecalciferol (VITAMIN D) 1000 UNITS tablet Take 1,000 Units by mouth daily.    . dapsone 25 MG tablet Take 25 mg by mouth daily.    . fish oil-omega-3 fatty acids 1000 MG capsule Take 2 g by mouth daily.    . furosemide (LASIX) 20 MG tablet TAKE 1 TABLET BY MOUTH EVERY DAY 30 tablet 1  . Ginkgo Biloba 40 MG TABS Take by mouth.    . hydrOXYzine (ATARAX/VISTARIL) 25 MG tablet Take 1 or 2 every 6 hours as needed for itching 30 tablet 0  . Multiple Vitamin (MULTIVITAMIN) tablet Take 1 tablet by mouth daily.    Marland Kitchen omeprazole (PRILOSEC) 40 MG capsule Take 40 mg by mouth daily.    . ondansetron (ZOFRAN-ODT) 8 MG disintegrating  tablet Take 1 tablet (8 mg total) by mouth every 8 (eight) hours as needed for nausea. 30 tablet 0  . triamcinolone cream (KENALOG) 0.1 % APPLY 1 APPLICATION TOPICALLY 2 (TWO) TIMES DAILY. 30 g 3  . vitamin C (ASCORBIC ACID) 500 MG tablet Take 500 mg by mouth daily. Takes only during winter months     Current Facility-Administered Medications on File Prior to Visit  Medication Dose Route Frequency Provider Last Rate Last Dose  . cyanocobalamin ((VITAMIN B-12)) injection 1,000 mcg  1,000 mcg Intramuscular Once [name], MD      . cyanocobalamin ((VITAMIN B-12)) injection 1,000 mcg  1,000 mcg Intramuscular Q30 days [name], MD   1,000 mcg at 07/27/15 1824    Review of Systems:  As per HPI- otherwise negative.   Physical Examination: Filed Vitals:   08/10/15 1610  BP: 126/77  Pulse: 87   Temp: 98.4 F (36.9 C)  Resp: 16   Filed Vitals:   08/10/15 1610  Height: 5' 2.75" (1.594 m)  Weight: 206 lb 6.4 oz (93.622 kg)   Body mass index is 36.85 kg/(m^2). Ideal Body Weight: Weight in (lb) to have BMI = 25: 139.7   GEN: WDWN, NAD, Non-toxic, A & O x 3, looks well except for rash HEENT: Atraumatic, Normocephalic. Neck supple. No masses, No LAD.  No oral lesions Ears and Nose: No external deformity. CV: RRR, No M/G/R. No JVD. No thrill. No extra heart sounds. PULM: CTA B, no wheezes, crackles, rhonchi. No retractions. No resp. distress. No accessory muscle use. EXTR: No c/c/e NEURO Normal gait.  PSYCH: Normally interactive. Conversant. Not depressed or anxious appearing.  Calm demeanor.  She has a diffuse, puritic rash over her limbs, trunk and face.  Some of it is weepy. Some is scaly and dry.  She has scratched and excoriated her skin extensively     Assessment and Plan: Skin pain - Plan: acetaminophen-codeine (TYLENOL #3) 300-30 MG tablet  Itching  Rash and nonspecific skin eruption  We were able to secure an appt with Dr. Tonia Brooms tomorrow- greatly appreciate her input.   For the time being elected not to change any treatment of her rash rx for tylenol #3 to help with her discomfort and help her to sleep   Signed Lamar Blinks, MD

## 2015-08-10 NOTE — Telephone Encounter (Signed)
Received her bx report- it appears to be a non- specific dermatitis, like a contact derm.  Called and LMOM.  I called [facility] and they report that they actually called her yesterday but she would not schedule an appt!   I will send her a copy of her path report

## 2015-08-10 NOTE — Telephone Encounter (Signed)
Dr. Lorelei Pont did I need to do anything with this?

## 2015-08-10 NOTE — Patient Instructions (Addendum)
You will be seen at Dr. Marisue Ivan office tomorrow at 3:30 pm- please arrive 10 minutes early  [place] MD ? Address: [address], [place], [place] [address] Phone: [phone]  Please do not apply products to you skin prior to your appointment so they can see what it looks like.  I hope that you fel better soon!   Try the tylenol with codeine for the itching and discomfort.  Remember this will make you sleepy.   Using this along with the itching medication can make you more sleepy!

## 2015-08-11 ENCOUNTER — Ambulatory Visit (INDEPENDENT_AMBULATORY_CARE_PROVIDER_SITE_OTHER): Payer: Medicare Other | Admitting: Family Medicine

## 2015-08-11 ENCOUNTER — Telehealth: Payer: Self-pay

## 2015-08-11 VITALS — BP 132/80 | HR 92 | Temp 98.9°F | Resp 17 | Ht 64.5 in | Wt 208.0 lb

## 2015-08-11 DIAGNOSIS — L500 Allergic urticaria: Secondary | ICD-10-CM | POA: Diagnosis not present

## 2015-08-11 LAB — POCT CBC
Granulocyte percent: 71 %G (ref 37–80)
HCT, POC: 37.2 % — AB (ref 37.7–47.9)
Hemoglobin: 12.6 g/dL (ref 12.2–16.2)
Lymph, poc: 1.6 (ref 0.6–3.4)
MCH, POC: 27.7 pg (ref 27–31.2)
MCHC: 33.8 g/dL (ref 31.8–35.4)
MCV: 82.1 fL (ref 80–97)
MID (cbc): 0.7 (ref 0–0.9)
MPV: 7.8 fL (ref 0–99.8)
POC Granulocyte: 5.6 (ref 2–6.9)
POC LYMPH PERCENT: 20.6 %L (ref 10–50)
POC MID %: 8.4 %M (ref 0–12)
Platelet Count, POC: 270 10*3/uL (ref 142–424)
RBC: 4.53 M/uL (ref 4.04–5.48)
RDW, POC: 15.4 %
WBC: 7.9 10*3/uL (ref 4.6–10.2)

## 2015-08-11 LAB — POCT SEDIMENTATION RATE: POCT SED RATE: 45 mm/hr — AB (ref 0–22)

## 2015-08-11 MED ORDER — TRIAMCINOLONE ACETONIDE 0.1 % EX CREA: TOPICAL_CREAM | CUTANEOUS | Status: DC

## 2015-08-11 MED ORDER — SODIUM CHLORIDE 0.9 % IV SOLN: 125.0000 mg | Freq: Once | INTRAVENOUS | Status: DC

## 2015-08-11 MED ORDER — METHYLPREDNISOLONE SODIUM SUCC 125 MG IJ SOLR
125.0000 mg | Freq: Once | INTRAMUSCULAR | Status: AC
  Administered 2015-08-11: 125 mg via INTRAVENOUS

## 2015-08-11 NOTE — Addendum Note (Signed)
Addended by: Kem Boroughs D on: 09/02/2015 05:31 PM   Modules accepted: Orders

## 2015-08-11 NOTE — Telephone Encounter (Signed)
I am confused- yesterday we called and were able to get her in to see Dr. Tonia Brooms (not [facility], not [facility]) today.  I called and spoke to the office manager there- apparently because they do not participate with [employer] she would have to pay the entire OV cost (not $75 as we were told yesterday by their staff) so she was not able to be seen there.  It looks like she is going to see Dr. Carlean Jews today.  There is a bx report back which showed spongiotic derm- will route to Dr. Carlean Jews

## 2015-08-11 NOTE — Progress Notes (Signed)
This chart was scribed for [name], MD by Moises Blood, medical scribe at [facility].The patient was seen in exam room 12 and the patient's care was started at 4:15 PM.  Patient ID: Ellen Johnson MRN: [id number], DOB: April 22, 1936, 79 y.o. Date of Encounter: 08/17/2015  Primary Physician: [name], MD  Chief Complaint:  Chief Complaint  Patient presents with  . Rash  . Allergic Reaction    HPI:  Ellen Johnson is a 79 y.o. female who presents to [facility] complaining of rash by allergic reaction. She saw Dr. Lorelei Pont yesterday for a recheck on the rash. She wanted a second opinion from her dermatologist, Dr. Denna Haggard. She declined an appointment with dermatologist the day prior to office visit yesterday. She's been itching all over, "can't sleep, this is driving me crazy." She was given tylenol #3.   The itching has gotten worse and she would scratch her hands until bleeding. She's been taking oatmeal baths and applying vaseline. She states that the hydroxyzine isn't helping her itching and doesn't help her to sleep. She denies any changes in her life. She's stopped taking her medication. She states that Dr. Denna Haggard told her not to use cortisone cream. She's been using hot water because it feels better. Her face feels really dry and has been using non-fragrant moisturizer. She took 1.5 tablets of tylenol #3 last night.   Past Medical History  Diagnosis Date  . GERD (gastroesophageal reflux disease)      Home Meds: Prior to Admission medications   Medication Sig Start Date End Date Taking? Authorizing Provider  acetaminophen-codeine (TYLENOL #3) 300-30 MG tablet Take 1 tablet by mouth every 6 (six) hours as needed for moderate pain. 08/10/15  Yes Gay Filler Copland, MD  acidophilus (RISAQUAD) CAPS capsule Take by mouth daily.   Yes Historical Provider, MD  calcium carbonate 200 MG capsule Take 250 mg by mouth 2 (two) times daily with  a meal.   Yes Historical Provider, MD  cholecalciferol (VITAMIN D) 1000 UNITS tablet Take 1,000 Units by mouth daily.   Yes Historical Provider, MD  dapsone 25 MG tablet Take 25 mg by mouth daily.   Yes Historical Provider, MD  fish oil-omega-3 fatty acids 1000 MG capsule Take 2 g by mouth daily.   Yes Historical Provider, MD  furosemide (LASIX) 20 MG tablet TAKE 1 TABLET BY MOUTH EVERY DAY 07/12/14  Yes [name], MD  Ginkgo Biloba 40 MG TABS Take by mouth.   Yes Historical Provider, MD  hydrOXYzine (ATARAX/VISTARIL) 25 MG tablet Take 1 or 2 every 6 hours as needed for itching 08/05/15  Yes Gay Filler Copland, MD  Multiple Vitamin (MULTIVITAMIN) tablet Take 1 tablet by mouth daily.   Yes Historical Provider, MD  omeprazole (PRILOSEC) 40 MG capsule Take 40 mg by mouth daily.   Yes Historical Provider, MD  ondansetron (ZOFRAN-ODT) 8 MG disintegrating tablet Take 1 tablet (8 mg total) by mouth every 8 (eight) hours as needed for nausea. 07/16/15  Yes [name], MD  triamcinolone cream (KENALOG) 0.1 % APPLY 1 APPLICATION TOPICALLY 2 (TWO) TIMES DAILY. 07/13/15  Yes [name], MD  vitamin C (ASCORBIC ACID) 500 MG tablet Take 500 mg by mouth daily. Takes only during winter months   Yes Historical Provider, MD    Allergies:  Allergies  Allergen Reactions  . Valium [Diazepam] Other (See Comments)    Totally different personality    Social History   Social  History  . Marital Status: Widowed    Spouse Name: N/A  . Number of Children: N/A  . Years of Education: N/A   Occupational History  . Not on file.   Social History Main Topics  . Smoking status: Never Smoker   . Smokeless tobacco: Never Used  . Alcohol Use: Yes  . Drug Use: Not on file  . Sexual Activity: Not on file   Other Topics Concern  . Not on file   Social History Narrative     Review of Systems: Constitutional: negative for fever, chills, night sweats, weight changes, or fatigue  HEENT: negative for  vision changes, hearing loss, congestion, rhinorrhea, ST, epistaxis, or sinus pressure Cardiovascular: negative for chest pain or palpitations Respiratory: negative for hemoptysis, wheezing, shortness of breath, or cough Abdominal: negative for abdominal pain, nausea, vomiting, diarrhea, or constipation Dermatological: positive for rash (face, legs, hands)  Neurologic: negative for headache, dizziness, or syncope All other systems reviewed and are otherwise negative with the exception to those above and in the HPI.  Physical Exam: Blood pressure 132/80, pulse 92, temperature 98.9 F (37.2 C), temperature source Oral, resp. rate 17, height 5' 4.5" (1.638 m), weight 208 lb (94.348 kg), SpO2 97 %., Body mass index is 35.16 kg/(m^2). General: Well developed, well nourished, in no acute distress. Head: Normocephalic, atraumatic, eyes without discharge, sclera non-icteric, nares are without discharge. Bilateral auditory canals clear, TM's are without perforation, pearly grey and translucent with reflective cone of light bilaterally. Oral cavity moist, posterior pharynx without exudate, erythema, peritonsillar abscess, or post nasal drip. Neck: Supple. No thyromegaly. Full ROM. No lymphadenopathy. Lungs: Clear bilaterally to auscultation without wheezes, rales, or rhonchi. Breathing is unlabored. Heart: RRR with S1 S2. No murmurs, rubs, or gallops appreciated. Msk:  Strength and tone normal for age. Extremities/Skin: Marked facial edema with diffuse bilateral dryness and erythema. Patient also has diffuse vesicular rash with excoriations on her hands and lower extremities Neuro: Alert and oriented X 3. Moves all extremities spontaneously. Gait is normal. CNII-XII grossly in tact. Psych:  Responds to questions appropriately with a normal affect.    ASSESSMENT AND PLAN:  79 y.o. year old female with  This chart was scribed in my presence and reviewed by me personally.    ICD-9-CM ICD-10-CM   1.  Allergic urticaria 708.0 L50.0 methylPREDNISolone sodium succinate (SOLU-MEDROL) 130 mg in sodium chloride 0.9 % 50 mL IVPB     triamcinolone cream (KENALOG) 0.1 %   I'm quite worried about [name]. I had sent her to the dermatologist but she still hasn't heard about her biopsy. We tried calling the dermatologist but the office was closed. We'll try to follow-up tomorrow.  By signing my name below, I, Moises Blood, attest that this documentation has been prepared under the direction and in the presence of [name], MD. Electronically Signed: Moises Blood, [place]. 09/02/2015 , 4:15 PM .  Signed, [name], MD 08/17/2015 4:15 PM

## 2015-08-11 NOTE — Telephone Encounter (Signed)
Yasmyne called again very upset/crying. She had never heard back from Dr. Denna Haggard at [facility] after her initial biopsy so was referred to [facility], they accepted the appointment but then called Othella to let her know she is out of their "co-op" and their may be out of pocket expense. Romya cancelled appt and requested referrals to [facility], she has already called their office and they are waiting on [name] to call. I called [facility], they are familiar with patient but are booked until February. At our insistence, they were able to fit Deeba in on Monday 12/5 at 2:40 with Dr. Allyson Sabal. I called Ursela with this information, she insists she needs an appt today. Advised her to recheck today with Dr. Carlean Jews at [facility] and hopefully we can ease her pain until she can go to the specialist on Monday. Pt agreed.

## 2015-08-11 NOTE — Telephone Encounter (Signed)
Pt saw dr copland yesterday and told pt to go to dr lupton which they will not see patient with out a referral pt is very upset and crying and is stating that this is an emergency   Best number [phone]

## 2015-08-11 NOTE — Patient Instructions (Addendum)
Keep your appointment with Dr. Allyson Sabal on Monday.  Please let me see you on Saturday if you're not significantly better

## 2015-08-12 ENCOUNTER — Telehealth: Payer: Self-pay

## 2015-08-12 ENCOUNTER — Other Ambulatory Visit: Payer: Self-pay | Admitting: Family Medicine

## 2015-08-12 LAB — COMPREHENSIVE METABOLIC PANEL
ALT: 16 U/L (ref 6–29)
AST: 22 U/L (ref 10–35)
Albumin: 3.8 g/dL (ref 3.6–5.1)
Alkaline Phosphatase: 95 U/L (ref 33–130)
BUN: 13 mg/dL (ref 7–25)
CO2: 24 mmol/L (ref 20–31)
Calcium: 8.9 mg/dL (ref 8.6–10.4)
Chloride: 105 mmol/L (ref 98–110)
Creat: 0.81 mg/dL (ref 0.60–0.93)
Glucose, Bld: 96 mg/dL (ref 65–99)
Potassium: 4.2 mmol/L (ref 3.5–5.3)
Sodium: 138 mmol/L (ref 135–146)
Total Bilirubin: 0.4 mg/dL (ref 0.2–1.2)
Total Protein: 6.6 g/dL (ref 6.1–8.1)

## 2015-08-12 NOTE — Telephone Encounter (Signed)
Pt wants Dr. Lorelei Pont and [name] to know the cream triamcinolone cream (KENALOG) 0.1 % [id number]  is working well.

## 2015-08-13 ENCOUNTER — Telehealth: Payer: Self-pay

## 2015-08-13 DIAGNOSIS — L299 Pruritus, unspecified: Secondary | ICD-10-CM

## 2015-08-13 MED ORDER — HYDROXYZINE HCL 25 MG PO TABS: ORAL_TABLET | ORAL | Status: DC

## 2015-08-13 NOTE — Telephone Encounter (Signed)
Patient states states she ran out of the itch medication and it going to pick up her refill today.

## 2015-08-13 NOTE — Telephone Encounter (Signed)
Patient called and needs more Atarax - I authorized a refill for the patient.

## 2015-08-13 NOTE — Telephone Encounter (Signed)
Left message on patient's voicemail for her to call back and let us know how she is doing and if prescription is working well for her.

## 2015-08-15 DIAGNOSIS — L309 Dermatitis, unspecified: Secondary | ICD-10-CM | POA: Diagnosis not present

## 2015-08-23 DIAGNOSIS — L98499 Non-pressure chronic ulcer of skin of other sites with unspecified severity: Secondary | ICD-10-CM | POA: Diagnosis not present

## 2015-08-23 DIAGNOSIS — L309 Dermatitis, unspecified: Secondary | ICD-10-CM | POA: Diagnosis not present

## 2015-08-26 ENCOUNTER — Other Ambulatory Visit: Payer: Self-pay | Admitting: Family Medicine

## 2015-08-27 NOTE — Telephone Encounter (Signed)
This rx was sent to the RF pool yesterday at 1:45 pm. This evening at 8 the pt called the answering service because this had not yet been addressed. I refilled her medication for itching.   Meds ordered this encounter  Medications  . hydrOXYzine (ATARAX/VISTARIL) 25 MG tablet    Sig: TAKE 1 OR 2 TABLETS BY MOUTH EVERY 6 HOURS AS NEEDED FOR ITCHING    Dispense:  40 tablet    Refill:  0

## 2015-09-11 DEATH — deceased

## 2015-09-13 ENCOUNTER — Other Ambulatory Visit: Payer: Self-pay | Admitting: Family Medicine

## 2015-09-13 NOTE — Telephone Encounter (Signed)
Does she still need a refill on Atarax

## 2015-09-14 ENCOUNTER — Telehealth: Payer: Self-pay

## 2015-09-14 NOTE — Telephone Encounter (Signed)
Checking on the status of medication.  Please advise  [phone]

## 2015-09-15 NOTE — Telephone Encounter (Signed)
Pt called to check status and a phone message was put in, which was forwarded to PA pool.

## 2015-09-15 NOTE — Telephone Encounter (Signed)
Refill for atarax

## 2015-09-16 MED ORDER — HYDROXYZINE HCL 25 MG PO TABS: ORAL_TABLET | ORAL | Status: DC

## 2015-09-16 NOTE — Telephone Encounter (Signed)
I have refilled the medication -  Please get a update on her situation and what the dermatologist is saying about the rash

## 2015-09-19 NOTE — Telephone Encounter (Signed)
Left message for pt to call back  °

## 2015-09-19 NOTE — Telephone Encounter (Signed)
Pt missed her CB. Please advise at [phone]

## 2015-09-22 ENCOUNTER — Telehealth: Payer: Self-pay

## 2015-09-22 DIAGNOSIS — N6320 Unspecified lump in the left breast, unspecified quadrant: Secondary | ICD-10-CM

## 2015-09-22 NOTE — Telephone Encounter (Signed)
Called and LMOM- I will set this up for her, will order a diagnostic mammo.  Let me know if she does not hear about her appt soon

## 2015-09-22 NOTE — Telephone Encounter (Signed)
Pt is wanting to talk with dr copland about getting a mammorgram to [facility] she has found a lump in  Her left breast   Best number [phone]

## 2015-10-03 ENCOUNTER — Other Ambulatory Visit: Payer: Self-pay

## 2015-10-03 DIAGNOSIS — N6320 Unspecified lump in the left breast, unspecified quadrant: Secondary | ICD-10-CM

## 2015-10-05 ENCOUNTER — Ambulatory Visit (INDEPENDENT_AMBULATORY_CARE_PROVIDER_SITE_OTHER): Payer: Medicare Other | Admitting: Family Medicine

## 2015-10-05 VITALS — BP 134/82 | HR 84 | Temp 98.0°F | Resp 20 | Ht 63.78 in | Wt 210.4 lb

## 2015-10-05 DIAGNOSIS — R21 Rash and other nonspecific skin eruption: Secondary | ICD-10-CM | POA: Diagnosis not present

## 2015-10-05 DIAGNOSIS — E538 Deficiency of other specified B group vitamins: Secondary | ICD-10-CM

## 2015-10-05 MED ORDER — FLUOCINONIDE 0.05 % EX OINT: 1.0000 "application " | TOPICAL_OINTMENT | Freq: Two times a day (BID) | CUTANEOUS | Status: DC

## 2015-10-05 MED ORDER — CYANOCOBALAMIN 1000 MCG/ML IJ SOLN
1000.0000 ug | INTRAMUSCULAR | Status: DC
  Administered 2015-10-05 – 2016-01-31 (×2): 1000 ug via INTRAMUSCULAR

## 2015-10-05 MED ORDER — METHYLPREDNISOLONE ACETATE 80 MG/ML IJ SUSP
80.0000 mg | Freq: Once | INTRAMUSCULAR | Status: AC
  Administered 2015-10-05: 80 mg via INTRAMUSCULAR

## 2015-10-05 MED ORDER — AMOXICILLIN-POT CLAVULANATE 875-125 MG PO TABS: 1.0000 | ORAL_TABLET | Freq: Two times a day (BID) | ORAL | Status: DC

## 2015-10-05 NOTE — Progress Notes (Signed)
80 yo woman with 3 months of rash that is now worse than ever and burning and weeping.  Three dermatology visits and biopsies later we have no diagnosis and no relief.  This started after vaccinations.  Using steroid creams without relief  Objective:  BP 134/82 mmHg  Pulse 84  Temp(Src) 98 F (36.7 C) (Oral)  Resp 20  Ht 5' 3.78" (1.62 m)  Wt 210 lb 6.4 oz (95.437 kg)  BMI 36.37 kg/m2  SpO2 96% Diffuse eczematous changes with warm erythema and excoriations and fissures both lower extremities and interdigital web spaces left hand.  Assessment:  This may be a fixed drug reaction.  Rash and nonspecific skin eruption - Plan: fluocinonide ointment (LIDEX) 0.05 %, methylPREDNISolone acetate (DEPO-MEDROL) injection 80 mg, amoxicillin-clavulanate (AUGMENTIN) 875-125 MG tablet, Ambulatory referral to Dermatology  [name], MD

## 2015-10-05 NOTE — Addendum Note (Signed)
Addended by: [name] on: 10/05/2015 05:38 PM   Modules accepted: Orders

## 2015-10-10 ENCOUNTER — Telehealth: Payer: Self-pay

## 2015-10-10 NOTE — Telephone Encounter (Signed)
Riddle regarding her dermatology appointment with Dr Allyn Kenner.  The appointment is scheduled for 10/14/15 at 10:00am with Dr Nevada Crane.  [facility] Address: [address], [place], [place] [address]  Phone: [phone]

## 2015-10-28 ENCOUNTER — Other Ambulatory Visit: Payer: Self-pay | Admitting: Family Medicine

## 2015-10-31 NOTE — Telephone Encounter (Signed)
Can we refill? 

## 2015-11-01 ENCOUNTER — Ambulatory Visit (INDEPENDENT_AMBULATORY_CARE_PROVIDER_SITE_OTHER): Payer: Medicare Other | Admitting: Family Medicine

## 2015-11-01 VITALS — BP 130/76 | HR 86 | Temp 98.1°F | Resp 18 | Ht 63.75 in | Wt 207.8 lb

## 2015-11-01 DIAGNOSIS — I8311 Varicose veins of right lower extremity with inflammation: Secondary | ICD-10-CM | POA: Diagnosis not present

## 2015-11-01 DIAGNOSIS — I83009 Varicose veins of unspecified lower extremity with ulcer of unspecified site: Secondary | ICD-10-CM

## 2015-11-01 DIAGNOSIS — I8312 Varicose veins of left lower extremity with inflammation: Secondary | ICD-10-CM | POA: Diagnosis not present

## 2015-11-01 DIAGNOSIS — M79662 Pain in left lower leg: Secondary | ICD-10-CM | POA: Diagnosis not present

## 2015-11-01 DIAGNOSIS — I872 Venous insufficiency (chronic) (peripheral): Secondary | ICD-10-CM

## 2015-11-01 DIAGNOSIS — L97909 Non-pressure chronic ulcer of unspecified part of unspecified lower leg with unspecified severity: Secondary | ICD-10-CM

## 2015-11-01 NOTE — Patient Instructions (Signed)
Elevate leg when you were just sitting around.  We will get an ultrasound of your calf tomorrow. I will not be in the office tomorrow. If you do not hear from someone regarding the report by the following morning please call and double check.  Follow up with your dermatology appointment as scheduled  We are making a referral to Dr. Aleda Grana at the vein clinic on [address].  Return as needed

## 2015-11-01 NOTE — Progress Notes (Signed)
Patient ID: Ellen Johnson, female    DOB: 1936-04-25  Age: 80 y.o. MRN: [id number]  Chief Complaint  Patient presents with  . Leg Pain    left    Subjective:   80 year old lady with left leg pain the last 2 nights awakening her. It doesn't hurt that much in the daytime. But she has been having a persistent numb sensation from the knee down of the left leg. She's been dealing with a drug eruption or some other rash on her whole body for the last 4 months or so. She says she has been told it was from getting the flu shot and Pneumovax at the same time. I reassured her that we routinely given at the same time. She has varicose veins which been bothering her she would like to see somebody about them. She is worried that she could have a clot.  Current allergies, medications, problem list, past/family and social histories reviewed.  Objective:  BP 130/76 mmHg  Pulse 86  Temp(Src) 98.1 F (36.7 C) (Oral)  Resp 18  Ht 5' 3.75" (1.619 m)  Wt 207 lb 12.8 oz (94.257 kg)  BMI 35.96 kg/m2  SpO2 96%  Dermatitis around the lower ankles. She has the old drug eruption appearance on the rest of the lower legs. Left calf feels slightly tighter than the right, both have a little edema. No erythema or warmth. Negative Homans sign. She describes the pain mostly being laterally the low mid shin. Pulse is good.  Assessment & Plan:   Assessment: 1. Calf pain, left   2. Stasis dermatitis of both legs   3. Varicose veins of lower extremities with ulcer, unspecified laterality ([name])       Plan: I think we should get an ultrasound of her calf. She is not able to safely drive to the hospital tonight. We will try and schedule it tomorrow. It is not a clear-cut clot, but need to be certain.  Orders Placed This Encounter  Procedures  . Ambulatory referral to Vascular Surgery    Referral Priority:  Routine    Referral Type:  Surgical    Referral Reason:  Specialty Services Required    Requested  Specialty:  Vascular Surgery    Number of Visits Requested:  1    No orders of the defined types were placed in this encounter.         Patient Instructions  Elevate leg when you were just sitting around.  We will get an ultrasound of your calf tomorrow. I will not be in the office tomorrow. If you do not hear from someone regarding the report by the following morning please call and double check.  Follow up with your dermatology appointment as scheduled  We are making a referral to Dr. Aleda Grana at the vein clinic on [address].  Return as needed     Return if symptoms worsen or fail to improve.   Serria Sloma, MD 11/01/2015

## 2015-11-02 ENCOUNTER — Telehealth: Payer: Self-pay | Admitting: *Deleted

## 2015-11-02 ENCOUNTER — Ambulatory Visit (HOSPITAL_COMMUNITY)
Admission: RE | Admit: 2015-11-02 | Discharge: 2015-11-02 | Disposition: A | Discharge status: Home / Self Care | Payer: Medicare Other | Source: Ambulatory Visit | Attending: Vascular Surgery | Admitting: Vascular Surgery

## 2015-11-02 DIAGNOSIS — M79662 Pain in left lower leg: Secondary | ICD-10-CM | POA: Insufficient documentation

## 2015-11-02 NOTE — Telephone Encounter (Signed)
Vascular lab called and stated that doppler was neg.  Advised them to let pt know that doppler was neg.    Dr. Linna Darner do have any further instructions for pt?

## 2015-11-02 NOTE — Telephone Encounter (Signed)
Pt states she woke up with a lot of pain and Dr Linna Darner stated he wanted to send her somewhere off [place] and she really would like to have the referral. Please call [phone]

## 2015-11-04 NOTE — Telephone Encounter (Signed)
LMOM that we have sent her information over to the vein specialist.

## 2015-11-07 ENCOUNTER — Other Ambulatory Visit: Payer: Self-pay

## 2015-11-07 MED ORDER — HYDROXYZINE HCL 25 MG PO TABS: ORAL_TABLET | ORAL | Status: DC

## 2015-11-07 NOTE — Telephone Encounter (Signed)
Noted that we sent things to vein clinic.

## 2015-11-07 NOTE — Telephone Encounter (Signed)
Pt has been referred to dermatology, but no appt sch yet. Will refill while waiting for appt.

## 2015-11-09 ENCOUNTER — Ambulatory Visit
Admission: RE | Admit: 2015-11-09 | Discharge: 2015-11-09 | Disposition: A | Discharge status: Home / Self Care | Payer: Medicare Other | Source: Ambulatory Visit | Attending: Family Medicine | Admitting: Family Medicine

## 2015-11-09 DIAGNOSIS — N6320 Unspecified lump in the left breast, unspecified quadrant: Secondary | ICD-10-CM

## 2015-11-09 DIAGNOSIS — N644 Mastodynia: Secondary | ICD-10-CM | POA: Diagnosis not present

## 2015-11-16 ENCOUNTER — Other Ambulatory Visit: Payer: Self-pay | Admitting: Family Medicine

## 2015-11-22 ENCOUNTER — Telehealth: Payer: Self-pay

## 2015-11-22 NOTE — Telephone Encounter (Signed)
Pt called in and stated that her pharmacy said she needed a order from Dr Joseph Art for a "colon test". She can be reached @ [phone]. Thank you

## 2015-11-23 ENCOUNTER — Other Ambulatory Visit: Payer: Self-pay

## 2015-11-23 MED ORDER — HYDROXYZINE HCL 25 MG PO TABS: ORAL_TABLET | ORAL | Status: DC

## 2015-11-23 NOTE — Telephone Encounter (Signed)
Pt states she has been nauseous... Any rx avialable   [facility] [address]

## 2015-11-24 ENCOUNTER — Other Ambulatory Visit: Payer: Self-pay | Admitting: Family Medicine

## 2015-11-24 DIAGNOSIS — R110 Nausea: Secondary | ICD-10-CM

## 2015-11-24 MED ORDER — PROMETHAZINE HCL 12.5 MG PO TABS: 12.5000 mg | ORAL_TABLET | Freq: Three times a day (TID) | ORAL | Status: DC | PRN

## 2015-11-25 NOTE — Progress Notes (Signed)
Spoke with pt - she is at the hospital still.  Pt very weepy - states she has not been seen yet.  Advised pt she needs to RTC to see Dr. Carlota Raspberry and discuss the depression that was screened today. Asked her to call and let us know how she was doing.

## 2015-11-26 NOTE — Telephone Encounter (Signed)
It appears phenergan was sent in. It appears this note had been routed to me inadvertently.

## 2015-11-29 ENCOUNTER — Telehealth: Payer: Self-pay

## 2015-11-29 ENCOUNTER — Telehealth: Payer: Self-pay | Admitting: Family Medicine

## 2015-11-29 NOTE — Telephone Encounter (Signed)
Left message on phone.  The patient has an appointment at [facility] with Dr Benson Norway on 12/01/15 at 2:45pm.

## 2015-12-21 ENCOUNTER — Ambulatory Visit (INDEPENDENT_AMBULATORY_CARE_PROVIDER_SITE_OTHER): Payer: Medicare Other | Admitting: Family Medicine

## 2015-12-21 VITALS — BP 130/62 | HR 71 | Temp 97.9°F | Resp 20 | Ht 63.5 in | Wt 208.0 lb

## 2015-12-21 DIAGNOSIS — E538 Deficiency of other specified B group vitamins: Secondary | ICD-10-CM

## 2015-12-21 DIAGNOSIS — L500 Allergic urticaria: Secondary | ICD-10-CM | POA: Diagnosis not present

## 2015-12-21 DIAGNOSIS — R21 Rash and other nonspecific skin eruption: Secondary | ICD-10-CM

## 2015-12-21 DIAGNOSIS — N939 Abnormal uterine and vaginal bleeding, unspecified: Secondary | ICD-10-CM | POA: Diagnosis not present

## 2015-12-21 DIAGNOSIS — R768 Other specified abnormal immunological findings in serum: Secondary | ICD-10-CM

## 2015-12-21 DIAGNOSIS — R600 Localized edema: Secondary | ICD-10-CM

## 2015-12-21 LAB — POC MICROSCOPIC URINALYSIS (UMFC)

## 2015-12-21 LAB — POCT URINALYSIS DIP (MANUAL ENTRY)
Bilirubin, UA: NEGATIVE
Glucose, UA: NEGATIVE
Nitrite, UA: NEGATIVE
Spec Grav, UA: 1.02
Urobilinogen, UA: 1
pH, UA: 5.5

## 2015-12-21 LAB — POCT SEDIMENTATION RATE: POCT SED RATE: 48 mm/hr — AB (ref 0–22)

## 2015-12-21 MED ORDER — CYANOCOBALAMIN 1000 MCG/ML IJ SOLN
1000.0000 ug | INTRAMUSCULAR | Status: DC
  Administered 2015-12-21 – 2016-05-10 (×2): 1000 ug via INTRAMUSCULAR

## 2015-12-21 MED ORDER — CYPROHEPTADINE HCL 4 MG PO TABS: 4.0000 mg | ORAL_TABLET | Freq: Three times a day (TID) | ORAL | Status: DC | PRN

## 2015-12-21 MED ORDER — HYDROXYZINE HCL 25 MG PO TABS: ORAL_TABLET | ORAL | Status: DC

## 2015-12-21 MED ORDER — METHYLPREDNISOLONE ACETATE 80 MG/ML IJ SUSP
80.0000 mg | Freq: Once | INTRAMUSCULAR | Status: AC
  Administered 2015-12-21: 80 mg via INTRAMUSCULAR

## 2015-12-21 MED ORDER — FLUCONAZOLE 150 MG PO TABS: 150.0000 mg | ORAL_TABLET | Freq: Once | ORAL | Status: DC

## 2015-12-21 MED ORDER — FUROSEMIDE 20 MG PO TABS: 20.0000 mg | ORAL_TABLET | Freq: Every day | ORAL | Status: DC

## 2015-12-21 MED ORDER — TRIAMCINOLONE ACETONIDE 0.1 % EX CREA: TOPICAL_CREAM | CUTANEOUS | Status: DC

## 2015-12-21 NOTE — Patient Instructions (Addendum)
Please follow-up with Dr. Nevada Crane next week. I've ordered some new medication for the rash. Also getting blood tests to see if we can get this under better understanding.  I think that you may have a yeast infection accounting for the brownish discharge.  I would like to see you in one month.

## 2015-12-21 NOTE — Progress Notes (Signed)
Rash since October.  4 dermatologists have not figured it out. Biopsy: nonspecific inflammation.  Over the last 3 days patient has developed progressive rash on her face and the extremity rash on her left arm, both legs, and chest as gotten much worse. Itching is unbearable. She has not responded well to the hydroxyzine and would like to try something else. She says Benadryl works a little bit better.  2 days of vaginal bleeding she thinks with some brownish vaginal discharge. She says she's not having itching in the vaginal area. She is status post hysterectomy.  Patient is complaining about pedal edema as well.  Objective:  BP 130/62 mmHg  Pulse 71  Temp(Src) 97.9 F (36.6 C) (Oral)  Resp 20  Ht 5' 3.5" (1.613 m)  Wt 208 lb (94.348 kg)  BMI 36.26 kg/m2  SpO2 96% Patient has pressured speech Examination the skin reveals marked eczematous changes over the anterior chest, left dorsal hand, both lower extremities. The rash spares her back and right hand. Both lower extremities show peeling excoriated skin which is reddened. Both lower extremities show 2-3+ edema with tight, shiny skin. Examination of the vaginal area reveals no blood, slightly brown discharge and atrophic mucosa. Is no active bleeding although there is an excoriated area in the clitoris which could explain the bright red bleeding.  Rash and nonspecific skin eruption - Plan: CK, POCT SEDIMENTATION RATE, ANA, IFA Comprehensive Panel, methylPREDNISolone acetate (DEPO-MEDROL) injection 80 mg, cyproheptadine (PERIACTIN) 4 MG tablet, DISCONTINUED: hydrOXYzine (ATARAX/VISTARIL) 25 MG tablet  Vaginal bleeding - Plan: POCT urinalysis dipstick, POCT Microscopic Urinalysis ([facility]), fluconazole (DIFLUCAN) 150 MG tablet  Pedal edema - Plan: furosemide (LASIX) 20 MG tablet  Allergic urticaria - Plan: triamcinolone cream (KENALOG) 0.1 %  B12 deficiency - Plan: cyanocobalamin ((VITAMIN B-12)) injection 1,000 mcg  Will follow-up with the  dermatologist Dr. Nevada Crane next week.  [name], MD

## 2015-12-22 LAB — CK: Total CK: 65 U/L (ref 7–177)

## 2015-12-23 LAB — ANA, IFA COMPREHENSIVE PANEL
Anti Nuclear Antibody(ANA): POSITIVE — AB
ENA SM Ab Ser-aCnc: <1
SM/RNP: <1
SSA (Ro) (ENA) Antibody, IgG: <1
SSB (La) (ENA) Antibody, IgG: <1
Scleroderma (Scl-70) (ENA) Antibody, IgG: <1
ds DNA Ab: <1 IU/mL

## 2015-12-23 LAB — ANTI-NUCLEAR AB-TITER (ANA TITER): ANA Titer 1: 1:160 {titer} — ABNORMAL HIGH

## 2015-12-23 NOTE — Addendum Note (Signed)
Addended by: [name] on: 12/23/2015 05:29 PM   Modules accepted: Orders

## 2015-12-27 ENCOUNTER — Ambulatory Visit (INDEPENDENT_AMBULATORY_CARE_PROVIDER_SITE_OTHER): Payer: Medicare Other | Admitting: Family Medicine

## 2015-12-27 VITALS — BP 132/80 | HR 94 | Temp 97.6°F | Resp 18 | Wt 204.0 lb

## 2015-12-27 DIAGNOSIS — M25559 Pain in unspecified hip: Secondary | ICD-10-CM

## 2015-12-27 DIAGNOSIS — R21 Rash and other nonspecific skin eruption: Secondary | ICD-10-CM | POA: Diagnosis not present

## 2015-12-27 DIAGNOSIS — I889 Nonspecific lymphadenitis, unspecified: Secondary | ICD-10-CM | POA: Diagnosis not present

## 2015-12-27 DIAGNOSIS — I776 Arteritis, unspecified: Secondary | ICD-10-CM | POA: Diagnosis not present

## 2015-12-27 DIAGNOSIS — M25569 Pain in unspecified knee: Secondary | ICD-10-CM | POA: Diagnosis not present

## 2015-12-27 LAB — POCT CBC
Granulocyte percent: 86.1 %G — AB (ref 37–80)
HCT, POC: 35.4 % — AB (ref 37.7–47.9)
Hemoglobin: 11.9 g/dL — AB (ref 12.2–16.2)
Lymph, poc: 1.1 (ref 0.6–3.4)
MCH, POC: 26.6 pg — AB (ref 27–31.2)
MCHC: 33.6 g/dL (ref 31.8–35.4)
MCV: 79.2 fL — AB (ref 80–97)
MID (cbc): 0.3 (ref 0–0.9)
MPV: 7.3 fL (ref 0–99.8)
POC Granulocyte: 8.7 — AB (ref 2–6.9)
POC LYMPH PERCENT: 10.5 %L (ref 10–50)
POC MID %: 3.4 %M (ref 0–12)
Platelet Count, POC: 320 10*3/uL (ref 142–424)
RBC: 4.47 M/uL (ref 4.04–5.48)
RDW, POC: 17.1 %
WBC: 10.1 10*3/uL (ref 4.6–10.2)

## 2015-12-27 LAB — POCT URINALYSIS DIP (MANUAL ENTRY)
Glucose, UA: NEGATIVE
Nitrite, UA: NEGATIVE
Protein Ur, POC: 100 — AB
Spec Grav, UA: 1.015
Urobilinogen, UA: 4
pH, UA: 7.5

## 2015-12-27 NOTE — Patient Instructions (Addendum)
Thank heavens you're better!

## 2015-12-27 NOTE — Progress Notes (Signed)
80 yo woman with arthralgias and stubborn rash who returns after seeing Dr. Allyn Kenner.  He has dx'd her with an arteritis and requests further tests to rule out organ involvement.   [phone] Dr. Allyn Kenner  Objective:  BP 132/80 mmHg  Pulse 94  Temp(Src) 97.6 F (36.4 C) (Oral)  Resp 18  Wt 204 lb (92.534 kg)  SpO2 96% Patient looks 100% better today.  She does have dishydrotic eczema on her palms.  Rash and nonspecific skin eruption - Plan: POCT CBC, COMPLETE METABOLIC PANEL WITH GFR, POCT urinalysis dipstick  Chronic arthralgias of knees and hips, unspecified laterality - Plan: POCT CBC, COMPLETE METABOLIC PANEL WITH GFR, POCT urinalysis dipstick  [name], MD

## 2015-12-28 LAB — COMPLETE METABOLIC PANEL WITH GFR
ALT: 14 U/L (ref 6–29)
AST: 16 U/L (ref 10–35)
Albumin: 4.2 g/dL (ref 3.6–5.1)
Alkaline Phosphatase: 93 U/L (ref 33–130)
BUN: 15 mg/dL (ref 7–25)
CO2: 26 mmol/L (ref 20–31)
Calcium: 9.2 mg/dL (ref 8.6–10.4)
Chloride: 101 mmol/L (ref 98–110)
Creat: 0.84 mg/dL (ref 0.60–0.93)
GFR, Est African American: 78 mL/min (ref >=60)
GFR, Est Non African American: 67 mL/min (ref >=60)
Glucose, Bld: 161 mg/dL — ABNORMAL HIGH (ref 65–99)
Potassium: 4 mmol/L (ref 3.5–5.3)
Sodium: 141 mmol/L (ref 135–146)
Total Bilirubin: 0.4 mg/dL (ref 0.2–1.2)
Total Protein: 7.2 g/dL (ref 6.1–8.1)

## 2015-12-29 ENCOUNTER — Telehealth: Payer: Self-pay

## 2015-12-29 DIAGNOSIS — N939 Abnormal uterine and vaginal bleeding, unspecified: Secondary | ICD-10-CM

## 2015-12-29 NOTE — Telephone Encounter (Signed)
Patient is having drainage that's like having a period. She states the color went from bright red to yellow and it happens 3 times a day.  [phone]

## 2016-01-01 ENCOUNTER — Telehealth: Payer: Self-pay

## 2016-01-01 NOTE — Telephone Encounter (Signed)
The patient is requesting a medication for the rash on her hands.  She typically sees Dr Joseph Art.  [facility] [address]  CB#: [phone]

## 2016-01-02 ENCOUNTER — Telehealth: Payer: Self-pay

## 2016-01-02 NOTE — Telephone Encounter (Signed)
Does she need to come in? Please advise.

## 2016-01-02 NOTE — Telephone Encounter (Signed)
Patient needs evaluation. [name] did a pelvic last week. She can either come in for re-evaluation or [name] can refer her to a gynecologist.

## 2016-01-02 NOTE — Telephone Encounter (Signed)
Patient called and stated that Dr L told her that one of her lab results came back positive and her son looked it up on the internet and told her that there were different strands of whatever Dr L told her she had and she wanted to know which strand she had. She would like for him to call her at [phone] .

## 2016-01-02 NOTE — Telephone Encounter (Signed)
She wants to see a gynecologist. Referral.

## 2016-01-04 ENCOUNTER — Telehealth: Payer: Self-pay

## 2016-01-04 DIAGNOSIS — Z86010 Personal history of colonic polyps: Secondary | ICD-10-CM | POA: Diagnosis not present

## 2016-01-04 DIAGNOSIS — K219 Gastro-esophageal reflux disease without esophagitis: Secondary | ICD-10-CM | POA: Diagnosis not present

## 2016-01-04 NOTE — Telephone Encounter (Signed)
I left message that we would follow Dr. Juel Burrow schedule for the prednisone before doing additional testing

## 2016-01-04 NOTE — Telephone Encounter (Signed)
LMOM for pt that Dr L put several RFs on the triamc cream he Rxd at 4/12 OV, and she just needs to call pharm and have them get one ready.

## 2016-01-04 NOTE — Telephone Encounter (Signed)
Dr L, please call pt or advise what you would like [name] to expain to pt. Looks like she is calling about her ANA.

## 2016-01-04 NOTE — Telephone Encounter (Signed)
[name],  Patient called in regards to her labs from 12/21/15-12/27/15. Please advise results and if patient has lupus and if she needs to see a GYN.   Thanks   [employer]

## 2016-01-04 NOTE — Telephone Encounter (Signed)
As noted, I left a message on patient's answering machine explaining that she may have lupus or a lupus-like problem. This usually is treated with prednisone as Dr. Nevada Crane as prescribed. It may just be a temporary problem because lupus can be an intermittent problem.  I want her to continue the prednisone is Dr. Nevada Crane has prescribed and I will see her back in 2 weeks to discuss whether we need to do further tests. If the rash worsens in the meantime, we can do more specific tests for lupus but the treatment will still be the same: Prednisone

## 2016-01-04 NOTE — Telephone Encounter (Signed)
Pt wants to know about lupus results and also bloody discharge. She got a pap smear and not sure if Dr. Carlean Jews wants her to go to a  Gynecologist.  Please advise  [phone]

## 2016-01-06 NOTE — Telephone Encounter (Signed)
Left voicemail for patient to call back. 

## 2016-01-06 NOTE — Telephone Encounter (Signed)
Patient is returning a missed phone call. She'll be available until 4 today. Please call!

## 2016-01-06 NOTE — Telephone Encounter (Signed)
Spoke to pt. Advised pt of results. Pt said rash has not worsened. Advised pt to follow up in office to determine if further testing is needed. Pt will follow up with [name] next Thursday.  Pt would like more Prednisone called in.

## 2016-01-08 NOTE — Telephone Encounter (Signed)
Dr. Nevada Crane is managing the prednisone at this point

## 2016-01-11 DIAGNOSIS — B353 Tinea pedis: Secondary | ICD-10-CM | POA: Diagnosis not present

## 2016-01-11 DIAGNOSIS — L958 Other vasculitis limited to the skin: Secondary | ICD-10-CM | POA: Diagnosis not present

## 2016-01-11 NOTE — Telephone Encounter (Signed)
Patient called from Dr Juel Burrow office and stated we were to send her most recent labs over a week ago to them and they had not gotten them, So I spoke with the nurse there and faxed over her last 2 ov lab results on 01/11/16 and got a conformation that they went through.

## 2016-01-13 ENCOUNTER — Ambulatory Visit: Payer: Medicare Other | Admitting: Gynecology

## 2016-01-20 ENCOUNTER — Ambulatory Visit: Payer: Medicare Other | Admitting: Gynecology

## 2016-01-20 ENCOUNTER — Telehealth: Payer: Self-pay

## 2016-01-20 MED ORDER — HYDROXYZINE HCL 25 MG PO TABS: 12.5000 mg | ORAL_TABLET | Freq: Three times a day (TID) | ORAL | Status: DC | PRN

## 2016-01-20 NOTE — Telephone Encounter (Signed)
Patient states she is breaking out in a rash, hands, feet, and legs.  She states that the dermatologist told her that it is a reaction from the pneumo shot that she received in October.  Pt states that Dr. Carlean Jews is aware and has given her a pill for it in the past but she is unsure what it is.  She would like some more of this same medication called in if possible. Best number [phone] [facility] [address]

## 2016-01-23 NOTE — Telephone Encounter (Signed)
I really think the ball is in Dr. Juel Burrow court at this point.

## 2016-01-23 NOTE — Telephone Encounter (Signed)
[name],  Patient called stating that she is breaking with a rash again on her hands, feet, and legs. Per pt she was given medication by you and it seem to help her. She is not sure which medication it was and per the Dermatologist the rash is due to the Pneumovax vaccine she received in October. Patient saw you in January and was given Lidex, Augumentin and a steroid shot in the office. Came back in April for same sxs and was given Kenalog and lasix. She was on Prednisone too but that was being managed by Dr. Nevada Crane. Please advise if patient needs to come back in for follow up with you or if you would like to send in some medication . Pt uses [facility]. Patients call back [phone]

## 2016-01-23 NOTE — Telephone Encounter (Signed)
LMTRC

## 2016-01-24 NOTE — Telephone Encounter (Signed)
Advised pt

## 2016-01-31 ENCOUNTER — Telehealth: Payer: Self-pay | Admitting: *Deleted

## 2016-01-31 ENCOUNTER — Ambulatory Visit (INDEPENDENT_AMBULATORY_CARE_PROVIDER_SITE_OTHER): Payer: Medicare Other | Admitting: Family Medicine

## 2016-01-31 ENCOUNTER — Ambulatory Visit (INDEPENDENT_AMBULATORY_CARE_PROVIDER_SITE_OTHER): Payer: Medicare Other | Admitting: Gynecology

## 2016-01-31 ENCOUNTER — Encounter: Payer: Self-pay | Admitting: Gynecology

## 2016-01-31 VITALS — BP 124/78 | Ht 64.0 in | Wt 204.0 lb

## 2016-01-31 VITALS — BP 132/80 | HR 86 | Temp 97.7°F | Resp 16 | Ht 64.0 in | Wt 205.0 lb

## 2016-01-31 DIAGNOSIS — N898 Other specified noninflammatory disorders of vagina: Secondary | ICD-10-CM

## 2016-01-31 DIAGNOSIS — Z1272 Encounter for screening for malignant neoplasm of vagina: Secondary | ICD-10-CM | POA: Diagnosis not present

## 2016-01-31 DIAGNOSIS — I8312 Varicose veins of left lower extremity with inflammation: Secondary | ICD-10-CM

## 2016-01-31 DIAGNOSIS — I8311 Varicose veins of right lower extremity with inflammation: Secondary | ICD-10-CM | POA: Diagnosis not present

## 2016-01-31 DIAGNOSIS — Z01419 Encounter for gynecological examination (general) (routine) without abnormal findings: Secondary | ICD-10-CM | POA: Diagnosis not present

## 2016-01-31 DIAGNOSIS — N950 Postmenopausal bleeding: Secondary | ICD-10-CM

## 2016-01-31 DIAGNOSIS — R21 Rash and other nonspecific skin eruption: Secondary | ICD-10-CM | POA: Diagnosis not present

## 2016-01-31 DIAGNOSIS — N823 Fistula of vagina to large intestine: Secondary | ICD-10-CM

## 2016-01-31 DIAGNOSIS — I872 Venous insufficiency (chronic) (peripheral): Secondary | ICD-10-CM

## 2016-01-31 LAB — WET PREP FOR TRICH, YEAST, CLUE
CLUE CELLS WET PREP: NONE SEEN
TRICH WET PREP: NONE SEEN
YEAST WET PREP: NONE SEEN

## 2016-01-31 MED ORDER — METRONIDAZOLE 500 MG PO TABS: ORAL_TABLET | ORAL | Status: DC

## 2016-01-31 MED ORDER — CIPROFLOXACIN HCL 500 MG PO TABS: 500.0000 mg | ORAL_TABLET | Freq: Two times a day (BID) | ORAL | Status: DC

## 2016-01-31 NOTE — Patient Instructions (Addendum)
Give me a call [phone] when you know what the surgeon says.  And let me know if you need any more cream or medications.

## 2016-01-31 NOTE — Progress Notes (Signed)
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  She is now relatively asymptomatic while on antibiotics.  She is scheduled to see Dr. Leighton Ruff.  Her dermatitis has largely resolved.  Objective:  BP 132/80 mmHg  Pulse 86  Temp(Src) 97.7 F (36.5 C) (Oral)  Resp 16  Ht 5\' 4"  (1.626 m)  Wt 205 lb (92.987 kg)  BMI 35.17 kg/m2  SpO2 93% Lower extremities:  Smooth skin with no erythema Hands:  dishydrotic   Assessment:  Patient will be evaluated for surgery.  She is psychologically preparing herself Skin:  Healing  Plan:  Follow up in one month  [name], MD

## 2016-01-31 NOTE — Telephone Encounter (Signed)
Dr.Fernadez told me verbally to schedule OV with Dr.Alicia Marcello Moores [id number] @ 10:00am at [facility] due to rectal vaginal fissure. Pt aware to time and date.

## 2016-01-31 NOTE — Patient Instructions (Signed)
Metronidazole tablets or capsules What is this medicine? METRONIDAZOLE (me troe NI da zole) is an antiinfective. It is used to treat certain kinds of bacterial and protozoal infections. It will not work for colds, flu, or other viral infections. This medicine may be used for other purposes; ask your health care provider or pharmacist if you have questions. What should I tell my health care provider before I take this medicine? They need to know if you have any of these conditions: -anemia or other blood disorders -disease of the nervous system -fungal or yeast infection -if you drink alcohol containing drinks -liver disease -seizures -an unusual or allergic reaction to metronidazole, or other medicines, foods, dyes, or preservatives -pregnant or trying to get pregnant -breast-feeding How should I use this medicine? Take this medicine by mouth with a full glass of water. Follow the directions on the prescription label. Take your medicine at regular intervals. Do not take your medicine more often than directed. Take all of your medicine as directed even if you think you are better. Do not skip doses or stop your medicine early. Talk to your pediatrician regarding the use of this medicine in children. Special care may be needed. Overdosage: If you think you have taken too much of this medicine contact a poison control center or emergency room at once. NOTE: This medicine is only for you. Do not share this medicine with others. What if I miss a dose? If you miss a dose, take it as soon as you can. If it is almost time for your next dose, take only that dose. Do not take double or extra doses. What may interact with this medicine? Do not take this medicine with any of the following medications: -alcohol or any product that contains alcohol -amprenavir oral solution -cisapride -disulfiram -dofetilide -dronedarone -paclitaxel injection -pimozide -ritonavir oral solution -sertraline oral  solution -sulfamethoxazole-trimethoprim injection -thioridazine -ziprasidone This medicine may also interact with the following medications: -birth control pills -cimetidine -lithium -other medicines that prolong the QT interval (cause an abnormal heart rhythm) -phenobarbital -phenytoin -warfarin This list may not describe all possible interactions. Give your health care provider a list of all the medicines, herbs, non-prescription drugs, or dietary supplements you use. Also tell them if you smoke, drink alcohol, or use illegal drugs. Some items may interact with your medicine. What should I watch for while using this medicine? Tell your doctor or health care professional if your symptoms do not improve or if they get worse. You may get drowsy or dizzy. Do not drive, use machinery, or do anything that needs mental alertness until you know how this medicine affects you. Do not stand or sit up quickly, especially if you are an older patient. This reduces the risk of dizzy or fainting spells. Avoid alcoholic drinks while you are taking this medicine and for three days afterward. Alcohol may make you feel dizzy, sick, or flushed. If you are being treated for a sexually transmitted disease, avoid sexual contact until you have finished your treatment. Your sexual partner may also need treatment. What side effects may I notice from receiving this medicine? Side effects that you should report to your doctor or health care professional as soon as possible: -allergic reactions like skin rash or hives, swelling of the face, lips, or tongue -confusion, clumsiness -difficulty speaking -discolored or sore mouth -dizziness -fever, infection -numbness, tingling, pain or weakness in the hands or feet -trouble passing urine or change in the amount of urine -redness, blistering, peeling  or loosening of the skin, including inside the mouth -seizures -unusually weak or tired -vaginal irritation, dryness,  or discharge Side effects that usually do not require medical attention (report to your doctor or health care professional if they continue or are bothersome): -diarrhea -headache -irritability -metallic taste -nausea -stomach pain or cramps -trouble sleeping This list may not describe all possible side effects. Call your doctor for medical advice about side effects. You may report side effects to FDA at 1-800-FDA-1088. Where should I keep my medicine? Keep out of the reach of children. Store at room temperature below 25 degrees C (77 degrees F). Protect from light. Keep container tightly closed. Throw away any unused medicine after the expiration date. NOTE: This sheet is a summary. It may not cover all possible information. If you have questions about this medicine, talk to your doctor, pharmacist, or health care provider.    2016, Elsevier/Gold Standard. (2013-04-03 14:08:39) Ciprofloxacin tablets What is this medicine? CIPROFLOXACIN (sip roe FLOX a sin) is a quinolone antibiotic. It is used to treat certain kinds of bacterial infections. It will not work for colds, flu, or other viral infections. This medicine may be used for other purposes; ask your health care provider or pharmacist if you have questions. What should I tell my health care provider before I take this medicine? They need to know if you have any of these conditions: -bone problems -cerebral disease -history of low levels of potassium in the blood -joint problems -irregular heartbeat -kidney disease -myasthenia gravis -seizures -tendon problems -tingling of the fingers or toes, or other nerve disorder -an unusual or allergic reaction to ciprofloxacin, other antibiotics or medicines, foods, dyes, or preservatives -pregnant or trying to get pregnant -breast-feeding How should I use this medicine? Take this medicine by mouth with a glass of water. Follow the directions on the prescription label. Take your medicine  at regular intervals. Do not take your medicine more often than directed. Take all of your medicine as directed even if you think your are better. Do not skip doses or stop your medicine early. You can take this medicine with food or on an empty stomach. It can be taken with a meal that contains dairy or calcium, but do not take it alone with a dairy product, like milk or yogurt or calcium-fortified juice. A special MedGuide will be given to you by the pharmacist with each prescription and refill. Be sure to read this information carefully each time. Talk to your pediatrician regarding the use of this medicine in children. Special care may be needed. Overdosage: If you think you have taken too much of this medicine contact a poison control center or emergency room at once. NOTE: This medicine is only for you. Do not share this medicine with others. What if I miss a dose? If you miss a dose, take it as soon as you can. If it is almost time for your next dose, take only that dose. Do not take double or extra doses. What may interact with this medicine? Do not take this medicine with any of the following medications: -cisapride -droperidol -terfenadine -tizanidine This medicine may also interact with the following medications: -antacids -birth control pills -caffeine -cyclosporin -didanosine (ddI) buffered tablets or powder -medicines for diabetes -medicines for inflammation like ibuprofen, naproxen -methotrexate -multivitamins -omeprazole -phenytoin -probenecid -sucralfate -theophylline -warfarin This list may not describe all possible interactions. Give your health care provider a list of all the medicines, herbs, non-prescription drugs, or dietary supplements you use.  Also tell them if you smoke, drink alcohol, or use illegal drugs. Some items may interact with your medicine. What should I watch for while using this medicine? Tell your doctor or health care professional if your  symptoms do not improve. Do not treat diarrhea with over the counter products. Contact your doctor if you have diarrhea that lasts more than 2 days or if it is severe and watery. You may get drowsy or dizzy. Do not drive, use machinery, or do anything that needs mental alertness until you know how this medicine affects you. Do not stand or sit up quickly, especially if you are an older patient. This reduces the risk of dizzy or fainting spells. This medicine can make you more sensitive to the sun. Keep out of the sun. If you cannot avoid being in the sun, wear protective clothing and use sunscreen. Do not use sun lamps or tanning beds/booths. Avoid antacids, aluminum, calcium, iron, magnesium, and zinc products for 6 hours before and 2 hours after taking a dose of this medicine. What side effects may I notice from receiving this medicine? Side effects that you should report to your doctor or health care professional as soon as possible: -allergic reactions like skin rash or hives, swelling of the face, lips, or tongue -anxious -confusion -depressed mood -diarrhea -fast, irregular heartbeat -hallucination, loss of contact with reality -joint, muscle, or tendon pain or swelling -pain, tingling, numbness in the hands or feet -suicidal thoughts or other mood changes -sunburn -unusually weak or tired Side effects that usually do not require medical attention (report to your doctor or health care professional if they continue or are bothersome): -dry mouth -headache -nausea -trouble sleeping This list may not describe all possible side effects. Call your doctor for medical advice about side effects. You may report side effects to FDA at 1-800-FDA-1088. Where should I keep my medicine? Keep out of the reach of children. Store at room temperature below 30 degrees C (86 degrees F). Keep container tightly closed. Throw away any unused medicine after the expiration date. NOTE: This sheet is a  summary. It may not cover all possible information. If you have questions about this medicine, talk to your doctor, pharmacist, or health care provider.    2016, Elsevier/Gold Standard. (2015-04-07 12:57:02) Anal Fistula An anal fistula is an abnormal tunnel that develops between the bowel and the skin near the outside of the anus, where stool (feces) comes out. The anus has many tiny glands that make lubricating fluid. Sometimes, these glands become plugged and infected, and that can cause a fluid-filled pocket (abscess) to form. An anal fistula often develops after this infection or abscess. CAUSES In most cases, an anal fistula is caused by a past or current anal abscess. Other causes include:  A complication of surgery.  Trauma to the rectal area.  Radiation to the area.  Medical conditions or diseases, such as:  Chronic inflammatory bowel disease, such as Crohn disease or ulcerative colitis.  Colon cancer or rectal cancer.  Diverticular disease, such as diverticulitis.  An STD (sexually transmitted disease), such as gonorrhea, chlamydia, or syphilis.  An infection that is caused by HIV (human immunodeficiency virus).  Foreign body in the rectum. SYMPTOMS Symptoms of this condition include:  Throbbing or constant pain that may be worse while you are sitting.  Swelling or irritation around the anus.  Drainage of pus or blood from an opening near the anus.  Pain with bowel movements.  Fever or chills. DIAGNOSIS  Your health care provider will examine the area to find the openings of the anal fistula and the fistula tract. The external opening of the anal fistula may be seen during a physical exam. You may also have tests, including:  An exam of the rectal area with a gloved hand (digital rectal exam).  An exam with a probe or scope to help locate the internal opening of the fistula.  Imaging tests to find the exact location and path of the fistula. These tests may  include X-rays, an ultrasound, a CT scan, or MRI. The path is made visible by a dye that is injected into the fistula opening. You may have other tests to find the cause of the anal fistula. TREATMENT The most common treatment for an anal fistula is surgery. The type of surgery that is used will depend on where the fistula is located and how complex the fistula is. Surgical options include:  A fistulotomy. The whole fistula is opened up, and the contents are drained to promote healing.  Seton placement. A silk string (seton) is placed into the fistula during a fistulotomy. This helps to drain any infection to promote healing.  Advancement flap procedure. Tissue is removed from your rectum or the skin around the anus and is attached to the opening of the fistula.  Bioprosthetic plug. A cone-shaped plug is made from your tissue and is used to block the opening of the fistula. Some anal fistulas do not require surgery. A nonsurgical treatment option involves injecting a fibrin glue to seal the fistula. You also may be prescribed an antibiotic medicine to treat an infection. HOME CARE INSTRUCTIONS Medicines  Take over-the-counter and prescription medicines only as told by your health care provider.  If you were prescribed an antibiotic medicine, take it as told by your health care provider. Do not stop taking the antibiotic even if you start to feel better.  Use a stool softener or a laxative if told to do so by your health care provider. General Instructions  Eat a high-fiber diet as told by your health care provider. This can help to prevent constipation.  Drink enough fluid to keep your urine clear or pale yellow.  Take a warm sitz bath for 15-20 minutes, 3-4 times per day, or as told by your health care provider. Sitz baths can ease your pain and discomfort and help with healing.  Follow good hygiene to keep the anal area as clean and dry as possible. Use wet toilet paper or a moist  towelette after each bowel movement.  Keep all follow-up visits as told by your health care provider. This is important. SEEK MEDICAL CARE IF:  You have increased pain that is not controlled with medicines.  You have new redness or swelling around the anal area.  You have new fluid, blood, or pus coming from the anal area.  You have tenderness or warmth around the anal area. SEEK IMMEDIATE MEDICAL CARE IF:  You have a fever.  You have severe pain.  You have chills or diarrhea.  You have severe problems urinating or having a bowel movement.   This information is not intended to replace advice given to you by your health care provider. Make sure you discuss any questions you have with your health care provider.   Document Released: 08/09/2008 Document Revised: 05/18/2015 Document Reviewed: 11/22/2014 Elsevier Interactive Patient Education [employer].

## 2016-01-31 NOTE — Progress Notes (Signed)
Ellen Johnson 06/04/1936 [id number]   History:    80 y.o.  for annual gyn exam who is a new patient to the practice who was referred to [name] by her PCP Dr. Verline Lema as a result of a questionable postmenopausal vaginal bleeding. Patient had stated that she had also seen her gastroenterologist Dr. Benson Norway as well for this issue. Patient many years ago at the age of 16 stated that she had a total abdominal hysterectomy with bilateral salpingo-oophorectomy as a result of heavy vaginal bleeding. Review of her records indicated in the past she has had history diverticulitis and was treated . She also has had history of colon polyps. She had a CT scan in July 2016 which had reported the following:  IMPRESSION: Small amount of strandy edema and free fluid along the sigmoid colon in the left hemipelvis extending to the rectosigmoid junction compatible with mild acute sigmoid diverticulitis compared to 01/05/2014.  Negative for abscess, perforation, or obstruction.  Status post prior cholecystectomy and hysterectomy  She was treated as an outpatient for diverticulitis. Patient denies any history of Crohn's disease or ulcerative colitis. Patient also denies prior to her hysterectomy any history of any abnormal Pap smears. Patient states her mammograms and bone density studies are up-to-date. Patient has never been on hormone replacement therapy in the past.  Past medical history,surgical history, family history and social history were all reviewed and documented in the EPIC chart.  Gynecologic History No LMP recorded. Patient has had a hysterectomy. Contraception: post menopausal status Last Pap: Many years ago. Results were: Patient reports it was normal Last mammogram: 3 months ago. Results were: normal  Obstetric History OB History  Gravida Para Term Preterm AB SAB TAB Ectopic Multiple Living  4 4        4     # Outcome Date GA Lbr Len/2nd Weight Sex Delivery Anes PTL Lv  4 Para             3 Para           2 Para           1 Para                ROS: A ROS was performed and pertinent positives and negatives are included in the history.  GENERAL: No fevers or chills. HEENT: No change in vision, no earache, sore throat or sinus congestion. NECK: No pain or stiffness. CARDIOVASCULAR: No chest pain or pressure. No palpitations. PULMONARY: No shortness of breath, cough or wheeze. GASTROINTESTINAL: No abdominal pain, nausea, vomiting or diarrhea, melena or bright red blood per rectum. GENITOURINARY: No urinary frequency, urgency, hesitancy or dysuria. MUSCULOSKELETAL: No joint or muscle pain, no back pain, no recent trauma. DERMATOLOGIC: No rash, no itching, no lesions. ENDOCRINE: No polyuria, polydipsia, no heat or cold intolerance. No recent change in weight. HEMATOLOGICAL: No anemia or easy bruising or bleeding. NEUROLOGIC: No headache, seizures, numbness, tingling or weakness. PSYCHIATRIC: No depression, no loss of interest in normal activity or change in sleep pattern.     Exam: chaperone present  BP 124/78 mmHg  Ht 5\' 4"  (1.626 m)  Wt 204 lb (92.534 kg)  BMI 35.00 kg/m2  Body mass index is 35 kg/(m^2).  General appearance : Well developed well nourished female. No acute distress HEENT: Eyes: no retinal hemorrhage or exudates,  Neck supple, trachea midline, no carotid bruits, no thyroidmegaly Lungs: Clear to auscultation, no rhonchi or wheezes, or rib retractions  Heart: Regular rate  and rhythm, no murmurs or gallops Breast:Examined in sitting and supine position were symmetrical in appearance, no palpable masses or tenderness,  no skin retraction, no nipple inversion, no nipple discharge, no skin discoloration, no axillary or supraclavicular lymphadenopathy Abdomen: no palpable masses or tenderness, no rebound or guarding Extremities: no edema or skin discoloration or tenderness  Pelvic:  Bartholin, Urethra, Skene Glands: Within normal limits             Vagina:  Slightly inflamed vaginal cuff friable on contact stool was present and the vagina putrid smell  Cervix: Absent  Uterus  absent  Adnexa  Without masses or tenderness  Anus and perineum  normal   Rectovaginal  normal sphincter tone without palpated masses or tenderness             Hemoccult negative   Wet prep stool present to numerous to count bacteria  Assessment/Plan:  80 y.o. female for annual exam who has been complaining of dark discharge mixed with blood at times for the past 2 weeks. 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 I am going to refer her to Dr. Leighton Ruff colorectal surgeon for further assessment and evaluation. Because of her findings and past history diverticulitis I'm going to start her on Cipro 500 mg one by mouth twice a day for 7 days as well as Flagyl 500 mg every 8 hours for 7 days. A Pap smear was also done today as well.   Terrance Mass MD, 1:53 PM 01/31/2016

## 2016-01-31 NOTE — Addendum Note (Signed)
Addended by: Nelva Nay on: 01/31/2016 02:38 PM   Modules accepted: Orders

## 2016-02-01 LAB — PAP IG W/ RFLX HPV ASCU

## 2016-02-07 DIAGNOSIS — K5790 Diverticulosis of intestine, part unspecified, without perforation or abscess without bleeding: Secondary | ICD-10-CM | POA: Diagnosis not present

## 2016-02-08 ENCOUNTER — Other Ambulatory Visit: Payer: Self-pay | Admitting: General Surgery

## 2016-02-08 DIAGNOSIS — K5790 Diverticulosis of intestine, part unspecified, without perforation or abscess without bleeding: Secondary | ICD-10-CM

## 2016-02-21 ENCOUNTER — Ambulatory Visit
Admission: RE | Admit: 2016-02-21 | Discharge: 2016-02-21 | Disposition: A | Discharge status: Home / Self Care | Payer: Medicare Other | Source: Ambulatory Visit | Attending: General Surgery | Admitting: General Surgery

## 2016-02-21 DIAGNOSIS — K5730 Diverticulosis of large intestine without perforation or abscess without bleeding: Secondary | ICD-10-CM | POA: Diagnosis not present

## 2016-02-21 DIAGNOSIS — K5790 Diverticulosis of intestine, part unspecified, without perforation or abscess without bleeding: Secondary | ICD-10-CM

## 2016-02-21 MED ORDER — IOPAMIDOL (ISOVUE-300) INJECTION 61%
125.0000 mL | Freq: Once | INTRAVENOUS | Status: AC | PRN
  Administered 2016-02-21: 125 mL via INTRAVENOUS

## 2016-02-23 ENCOUNTER — Telehealth: Payer: Self-pay

## 2016-02-23 NOTE — Telephone Encounter (Signed)
Patient doesn't trust her Education officer, environmental. She would like help finding a different Education officer, environmental. She adores Dr. Toney Rakes.  I will put in a call to Dr. Toney Rakes in the next couple days to discuss.

## 2016-02-23 NOTE — Telephone Encounter (Signed)
Patient is scheduled for gyn surgery for her fistula repair.  She had a repeat scan last week verifying this  Patient doesn't trust her general surgeon.  She would like help finding a different Education officer, environmental.  She adores Dr. Toney Rakes.  I will put in a call to Dr. Toney Rakes in the next couple days to discuss.

## 2016-02-23 NOTE — Telephone Encounter (Signed)
Pt lost dr Eulas Post cell phone number and is needing to talk with him before her surgery on 03/02/16  [name] please call [phone]

## 2016-02-29 ENCOUNTER — Telehealth: Payer: Self-pay

## 2016-02-29 NOTE — Telephone Encounter (Signed)
Pt is needing to talk with someone about her surgery coming up -she is requesting that lauenstein  Call her   Best number [phone]

## 2016-03-01 NOTE — Telephone Encounter (Signed)
SPoke with pt, she states Dr. Carlean Jews told her to call. She has an appt with the surgeon next Friday. She would like to know when you will be here in July?

## 2016-03-01 NOTE — Telephone Encounter (Signed)
Left message for pt to call back  °

## 2016-03-02 ENCOUNTER — Telehealth: Payer: Self-pay | Admitting: *Deleted

## 2016-03-02 NOTE — Telephone Encounter (Signed)
I won't be in the office until mid august.

## 2016-03-02 NOTE — Telephone Encounter (Signed)
Called patient left message in voice mail to advise patient Dr Joseph Art will be out of the office until mid August.

## 2016-03-03 NOTE — Telephone Encounter (Signed)
Left a vm for patient that Dr. Carlean Jews will not be back in office until mid August.

## 2016-03-21 DIAGNOSIS — R933 Abnormal findings on diagnostic imaging of other parts of digestive tract: Secondary | ICD-10-CM | POA: Diagnosis not present

## 2016-03-21 DIAGNOSIS — K632 Fistula of intestine: Secondary | ICD-10-CM | POA: Diagnosis not present

## 2016-03-28 ENCOUNTER — Encounter: Payer: Self-pay | Admitting: Physician Assistant

## 2016-03-28 DIAGNOSIS — N824 Other female intestinal-genital tract fistulae: Secondary | ICD-10-CM | POA: Insufficient documentation

## 2016-04-17 ENCOUNTER — Telehealth: Payer: Self-pay

## 2016-04-17 NOTE — Telephone Encounter (Signed)
Patient states she was told my Dr. Noralee Space that he would be here in August. I explained to patient that we're not allowed to request him. Patient wants to know if she can see him because he's the only one that gives her a B12 injection.   [phone]

## 2016-04-19 NOTE — Telephone Encounter (Signed)
My understanding is that because he is retired he cannot be requested - the patient will need to find another PCP

## 2016-04-20 NOTE — Telephone Encounter (Signed)
Left a detailed message for pt explaining [name] retired.

## 2016-05-01 ENCOUNTER — Telehealth: Payer: Self-pay

## 2016-05-01 NOTE — Telephone Encounter (Signed)
PATIENT WANTS TO GET A CALL BACK. SHE SAID [name] IS HER DOCTOR AND A PERSONAL FRIEND. SHE SAID HE GAVE HER HIS CELL PHONE NUMBER, BUT SHE CAN'T FIND IT. HE WANTED HER TO START SEEING A PARTICULAR DOCTOR HERE, BUT SHE FORGOT WHO HE SAID. SHE ONLY WANTS TO SEE WHO [name] SUGGESTED. SHE WOULD LIKE SOMEONE TO CALL HER BACK PLEASE. BEST PHONE [phone] (CELL)  [place]

## 2016-05-03 ENCOUNTER — Telehealth: Payer: Self-pay

## 2016-05-03 NOTE — Telephone Encounter (Signed)
Patient stated she need to ask the nurse about a shot.

## 2016-05-06 ENCOUNTER — Other Ambulatory Visit: Payer: Self-pay | Admitting: Family Medicine

## 2016-05-06 DIAGNOSIS — L500 Allergic urticaria: Secondary | ICD-10-CM

## 2016-05-07 NOTE — Telephone Encounter (Signed)
Left message for pt to call back  °

## 2016-05-08 NOTE — Telephone Encounter (Signed)
Patient wants a call back today about getting the b12 shot  [phone]

## 2016-05-08 NOTE — Telephone Encounter (Signed)
Pt will call tomorrow to schedule her monthly b-12 shot.

## 2016-05-10 ENCOUNTER — Ambulatory Visit (INDEPENDENT_AMBULATORY_CARE_PROVIDER_SITE_OTHER): Payer: Medicare Other | Admitting: Physician Assistant

## 2016-05-10 ENCOUNTER — Ambulatory Visit: Payer: Medicare Other

## 2016-05-10 DIAGNOSIS — E538 Deficiency of other specified B group vitamins: Secondary | ICD-10-CM | POA: Diagnosis not present

## 2016-05-10 DIAGNOSIS — Z23 Encounter for immunization: Secondary | ICD-10-CM

## 2016-05-10 NOTE — Progress Notes (Signed)
B12 injection 

## 2016-05-15 DIAGNOSIS — G8191 Hemiplegia, unspecified affecting right dominant side: Secondary | ICD-10-CM | POA: Diagnosis not present

## 2016-05-19 ENCOUNTER — Ambulatory Visit (INDEPENDENT_AMBULATORY_CARE_PROVIDER_SITE_OTHER): Payer: Medicare Other | Admitting: Family Medicine

## 2016-05-19 VITALS — BP 148/72 | HR 93 | Temp 98.1°F | Resp 16 | Ht 64.0 in | Wt 204.0 lb

## 2016-05-19 DIAGNOSIS — I8311 Varicose veins of right lower extremity with inflammation: Secondary | ICD-10-CM | POA: Diagnosis not present

## 2016-05-19 DIAGNOSIS — I8312 Varicose veins of left lower extremity with inflammation: Secondary | ICD-10-CM

## 2016-05-19 DIAGNOSIS — L500 Allergic urticaria: Secondary | ICD-10-CM

## 2016-05-19 DIAGNOSIS — N828 Other female genital tract fistulae: Secondary | ICD-10-CM

## 2016-05-19 DIAGNOSIS — C4491 Basal cell carcinoma of skin, unspecified: Secondary | ICD-10-CM

## 2016-05-19 DIAGNOSIS — I872 Venous insufficiency (chronic) (peripheral): Secondary | ICD-10-CM

## 2016-05-19 MED ORDER — METRONIDAZOLE 500 MG PO TABS: ORAL_TABLET | ORAL | 0 refills | Status: DC

## 2016-05-19 MED ORDER — TRIAMCINOLONE ACETONIDE 0.1 % EX CREA: TOPICAL_CREAM | CUTANEOUS | 3 refills | Status: DC

## 2016-05-19 NOTE — Patient Instructions (Signed)
My phone number [phone]  [facility] of [facility] in [place], [place] (for compression hose):  [phone]  The provider here you should see:  Philis Fendt

## 2016-05-19 NOTE — Progress Notes (Signed)
This is 80 year old woman, well-known to me, who presents with recurrence of her lower extremity uncomfortable burning rash and exfoliation. She's had chronic edema which is been mild to moderate.  Patient has been under the care of general surgery for a vaginal fistula.  She was given an antibiotic and until yesterday, the discharge had stopped so the surgery has been postponed.  She continues to work as a Land.  She has been forced out of her house because the mortgage was sold to a new lender who raised her payments.  Today is her deceased husband's date of death, which is also quite stressful.  She continues to have the left upper anterior chest rash, but it is not as severe as it has been in the past.  Objective:  BP (!) 148/72   Pulse 93   Temp 98.1 F (36.7 C) (Oral)   Resp 16   Ht 5\' 4"  (1.626 m)   Wt 204 lb (92.5 kg)   SpO2 94%   BMI 35.02 kg/m  HEENT: unremarkable Neck: supple Chest:  Clear Heart: 1/6 systolic murmur, regular Ext: 2+ edema with overlying mild reddened peeling skin in both lower extremities symmetrically.  Good pedal pulses.  No calf tenderness.  Assessment:  Overall stable situation.  The rash on the chest is still bothersome and she needs follow up dermatology appt. The vaginal fistula is still active and she needs surgical follow up for this. The chronic stasis dermatitis will benefit from compression hose and a refill of the triamcinolone.  Stasis dermatitis of both legs - Plan: triamcinolone cream (KENALOG) 0.1 %  Allergic urticaria  Vaginal fistula - Plan: metroNIDAZOLE (FLAGYL) 500 MG tablet, Ambulatory referral to General Surgery  Basal cell carcinoma - Plan: Ambulatory referral to Dermatology  [name], MD

## 2016-05-21 ENCOUNTER — Telehealth: Payer: Self-pay

## 2016-05-21 NOTE — Telephone Encounter (Signed)
Barnabas Lister from [facility] called on behalf of Frimy Yuh. They're requesting most recent lab work, ECG, and last year worth of office notes. Fax number [phone] ATTN: Barnabas Lister

## 2016-05-29 ENCOUNTER — Telehealth: Payer: Self-pay

## 2016-05-29 NOTE — Telephone Encounter (Signed)
Patient wants to know why Dr. Carlean Jews referred her to Dr. Marcello Moores office.  [phone]

## 2016-05-29 NOTE — Telephone Encounter (Signed)
Attempted to call pt. No answer . Left voice mail to call back.

## 2016-05-29 NOTE — Telephone Encounter (Signed)
Spoke with pt. Is seeing Dr. Benson Norway for this and does not need to see general surgery

## 2016-05-30 ENCOUNTER — Telehealth: Payer: Self-pay | Admitting: *Deleted

## 2016-05-30 DIAGNOSIS — R600 Localized edema: Secondary | ICD-10-CM

## 2016-05-30 MED ORDER — FUROSEMIDE 20 MG PO TABS: 20.0000 mg | ORAL_TABLET | Freq: Every day | ORAL | 0 refills | Status: DC

## 2016-05-30 NOTE — Telephone Encounter (Signed)
Pt legs are swelling again and she would like a refill Lasix.

## 2016-05-30 NOTE — Telephone Encounter (Signed)
Meds ordered this encounter  Medications  . furosemide (LASIX) 20 MG tablet    Sig: Take 1 tablet (20 mg total) by mouth daily.    Dispense:  30 tablet    Refill:  0    Order Specific Question:   Supervising Provider    Answer:   Brigitte Pulse, EVA N [[id number]]    Please advise that she needs to schedule a visit with a new PCP, as [name] has retired. [name] recommended that she schedule with Philis Fendt, PA-C.

## 2016-05-30 NOTE — Telephone Encounter (Signed)
Patient is returning a phone call for Ellen Johnson. Please call!

## 2016-05-31 NOTE — Telephone Encounter (Signed)
LMOM advising of RF and need to RTC to est care w/new provider.

## 2016-06-05 ENCOUNTER — Ambulatory Visit: Payer: Medicare Other

## 2016-06-06 ENCOUNTER — Ambulatory Visit (INDEPENDENT_AMBULATORY_CARE_PROVIDER_SITE_OTHER): Payer: Medicare Other | Admitting: Family Medicine

## 2016-06-06 ENCOUNTER — Encounter: Payer: Self-pay | Admitting: Family Medicine

## 2016-06-06 VITALS — BP 122/80 | HR 92 | Temp 97.9°F | Resp 18 | Ht 64.0 in | Wt 203.0 lb

## 2016-06-06 DIAGNOSIS — I8311 Varicose veins of right lower extremity with inflammation: Secondary | ICD-10-CM | POA: Diagnosis not present

## 2016-06-06 DIAGNOSIS — I872 Venous insufficiency (chronic) (peripheral): Secondary | ICD-10-CM

## 2016-06-06 DIAGNOSIS — L989 Disorder of the skin and subcutaneous tissue, unspecified: Secondary | ICD-10-CM

## 2016-06-06 DIAGNOSIS — I8312 Varicose veins of left lower extremity with inflammation: Secondary | ICD-10-CM | POA: Diagnosis not present

## 2016-06-06 NOTE — Patient Instructions (Addendum)
  I think you have chronic venous stasis.  This cause skin breakdown over time.  The treatment is something called Unna boots.  THey can apply these at the [facility].  I'll put the referral in and they will call you with an appointment.    Make sure to talk to the dermatologist about the spot on your chest.     IF you received an x-ray today, you will receive an invoice from [facility]. Please contact [name] at [phone] with questions or concerns regarding your invoice.   IF you received labwork today, you will receive an invoice from [employer]. Please contact [facility] at [phone] with questions or concerns regarding your invoice.   Our billing staff will not be able to assist you with questions regarding bills from these companies.  You will be contacted with the lab results as soon as they are available. The fastest way to get your results is to activate your My Chart account. Instructions are located on the last page of this paperwork. If you have not heard from [name] regarding the results in 2 weeks, please contact this office.

## 2016-06-06 NOTE — Progress Notes (Signed)
Ellen Johnson is a 80 y.o. female who presents to [facility] today for BL LE edema:  1.  BL LE edema:  Present for the past 2 years. She has been struggling with this for some time. States that she has seen multiple dermatologists. She has been treated with triamcinolone cream as well as multiple other ointments. The triamcinolone on really helps with itching but does not help with her swelling or the skin breakdown.  As swelling worsened she noticed worsening skin breakdown. The last dermatologist she saw put her on antibiotics but it is unclear which. She had some improvement with this but the breakdown has continued.  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 She is on her feet most the day. She tries to keep them elevated at the the the night so that this is no more than an hour to time. She also has tried Epsom salts for relief.  No falls. No actual weakness. When her legs are more swollen they are more painful.  ROS as above.  Pertinently, no chest pain, palpitations, SOB, Fever, Chills, Abd pain, N/V/D.   PMH reviewed. Patient is a nonsmoker.   Past Medical History:  Diagnosis Date  . GERD (gastroesophageal reflux disease)    Past Surgical History:  Procedure Laterality Date  . ABDOMINAL HYSTERECTOMY     TAH BSO  . FRACTURE SURGERY    . gall bladder removed      Medications reviewed. Current Outpatient Prescriptions  Medication Sig Dispense Refill  . acidophilus (RISAQUAD) CAPS capsule Take by mouth daily.    . calcium carbonate 200 MG capsule Take 250 mg by mouth 2 (two) times daily with a meal.    . cholecalciferol (VITAMIN D) 1000 UNITS tablet Take 1,000 Units by mouth daily.    . fish oil-omega-3 fatty acids 1000 MG capsule Take 2 g by mouth daily.    . fluocinonide ointment (LIDEX) AB-123456789 % Apply 1 application topically 2 (two) times daily. 60 g 2  . furosemide (LASIX) 20 MG tablet Take 1 tablet (20 mg total) by  mouth daily. 30 tablet 0  . Ginkgo Biloba 40 MG TABS Take by mouth.    . hydrOXYzine (ATARAX/VISTARIL) 25 MG tablet Take 0.5-1 tablets (12.5-25 mg total) by mouth every 8 (eight) hours as needed for itching. 30 tablet 1  . Multiple Vitamin (MULTIVITAMIN) tablet Take 1 tablet by mouth daily.    Marland Kitchen omeprazole (PRILOSEC) 40 MG capsule Take 40 mg by mouth daily.    . ondansetron (ZOFRAN-ODT) 8 MG disintegrating tablet Take 1 tablet (8 mg total) by mouth every 8 (eight) hours as needed for nausea. 30 tablet 0  . Probiotic Product (PROBIOTIC PO) Take by mouth.    . triamcinolone cream (KENALOG) 0.1 % APPLY 1 APPLICATION TOPICALLY 2 (TWO) TIMES DAILY. 453 g 3  . vitamin C (ASCORBIC ACID) 500 MG tablet Take 500 mg by mouth daily. Takes only during winter months    . ciprofloxacin (CIPRO) 500 MG tablet Take 1 tablet (500 mg total) by mouth 2 (two) times daily. (Patient not taking: Reported on 06/06/2016) 14 tablet 0  . promethazine (PHENERGAN) 12.5 MG tablet Take 1 tablet (12.5 mg total) by mouth every 8 (eight) hours as needed for nausea or vomiting. (Patient not taking: Reported on 06/06/2016) 20 tablet 0   Current Facility-Administered Medications  Medication Dose Route Frequency Provider Last Rate Last Dose  . cyanocobalamin ((VITAMIN B-12))  injection 1,000 mcg  1,000 mcg Intramuscular Q30 days [name], MD   1,000 mcg at 07/27/15 1824  . cyanocobalamin ((VITAMIN B-12)) injection 1,000 mcg  1,000 mcg Intramuscular Q30 days [name], MD   1,000 mcg at 01/31/16 1711  . cyanocobalamin ((VITAMIN B-12)) injection 1,000 mcg  1,000 mcg Intramuscular Q30 days [name], MD   1,000 mcg at 05/10/16 1044     Physical Exam:  BP 122/80 (BP Location: Right Arm, Patient Position: Sitting, Cuff Size: Small)   Pulse 92   Temp 97.9 F (36.6 C) (Oral)   Resp 18   Ht 5\' 4"  (1.626 m)   Wt 203 lb (92.1 kg)   SpO2 96%   BMI 34.84 kg/m  Gen:  Alert, cooperative patient who appears stated age in no  acute distress.  Vital signs reviewed. HEENT: EOMI,  MMM Pulm:  Clear to auscultation bilaterally with good air movement.  No wheezes or rales noted.   Cardiac:  Regular rate and rhythm without murmur auscultated.  Good S1/S2. Ext:  +4 pitting edema to knees bilaterally. She has several varicose veins as well. Skin: She has multiple areas of skin breakdown on the tibial surface of her leg as well as dorsum of her foot. No signs of infection. No warmth. No lymphangitis. -Chest: She does have area of macular erythema on her left upper chest. Nontender. No drainage.  Some violaceous color is noted.  Assessment and Plan:  1.  Chronic venous stasis: -Leading to chronic venous dermatitis. -She can continue to use a triamcinolone cream for itching. -We discussed stopping her Lasix as this does not really seem to be helping much/should not really be helpful with chronic venous stasis -I'm referring her today to the wound center for evaluation and possible Unna boot placement.  #2.  Skin lesion on chest: -She already has an appointment with her dermatologist in 2 weeks. -At that point she should hopefully have her Unna boots. Discussed with her that she needs to have this area evaluated. Evidently had a basal cell CA here removed previously several years ago but this has not healed. If anything it has actually spread. -Follow-up with dermatology in 2 weeks.

## 2016-06-07 ENCOUNTER — Telehealth: Payer: Self-pay

## 2016-06-07 NOTE — Telephone Encounter (Signed)
[facility]   Requesting antibiotic   [facility] #[id number]

## 2016-06-08 ENCOUNTER — Telehealth: Payer: Self-pay

## 2016-06-08 DIAGNOSIS — I872 Venous insufficiency (chronic) (peripheral): Secondary | ICD-10-CM

## 2016-06-08 NOTE — Telephone Encounter (Signed)
Pt is calling to check on status of the antibodic request for her feet and ankle swelling itching and hurting   Best number [phone]

## 2016-06-11 NOTE — Telephone Encounter (Signed)
PATIENT STATES SHE SAW DR. Mingo Amber LAST WED. AND HE TOLD HER HE WOULD CALL HER IN AN ANTIBIOTIC FOR HER ITCHY AND BROKEN OUT FEET. SHE WAS JUST HERE AND SAID DR. Mingo Amber KNOWS WHAT HER FEET LOOKS LIKE. SHE IS STILL WAITING TO HEAR FROM THE WOUND CARE CENTER. SHE NEEDS SOMETHING CALLED INTO HER PHARMACY AS SOON AS POSSIBLE. HER FEET ARE REALLY GIVING HER A TIME. BEST PHONE [phone]  [place] ([place]) [place]

## 2016-06-11 NOTE — Telephone Encounter (Signed)
Left message apologizing for the delay in our call back. Advised at last ov she was to f/u with dermatologist and wound center. If feet/legs are worsening since seen 06/06/16 need to see [name] again this week and if open wounds, redness, drainage needs to go to ER for an evaluation today.

## 2016-06-11 NOTE — Telephone Encounter (Signed)
Dr Joseph Art is her pcp, Dr Mingo Amber is not here until [facility]. 06/13/16 Please advise.

## 2016-06-12 MED ORDER — TRIAMCINOLONE ACETONIDE 0.1 % EX CREA
TOPICAL_CREAM | CUTANEOUS | 3 refills | Status: DC
Start: 2016-06-12 — End: 2016-06-19

## 2016-06-12 NOTE — Telephone Encounter (Signed)
She doesn't have an infection and antibiotics won't help. We discussed this at her visit. For the itching, she should use the triamcinolone cream.  I'll send in another prescription of this for her.    The main thing is the wound center referral.  She needs Unna boots placed which will help with both the swelling, the itching, and the skin breakdown.

## 2016-06-13 ENCOUNTER — Telehealth: Payer: Self-pay

## 2016-06-13 NOTE — Telephone Encounter (Signed)
Please reference the call from 06/08/16.  Ellen Johnson she missed your call and would like a call back  Please advise [phone]

## 2016-06-13 NOTE — Telephone Encounter (Signed)
Left message explaining Dr Kennyth Arnold note.  Please see wound care referral to process and set up an appointment for the patient.

## 2016-06-13 NOTE — Telephone Encounter (Signed)
Error

## 2016-06-18 ENCOUNTER — Encounter (HOSPITAL_COMMUNITY): Payer: Self-pay | Admitting: Emergency Medicine

## 2016-06-18 ENCOUNTER — Emergency Department (HOSPITAL_COMMUNITY)
Admission: EM | Admit: 2016-06-18 | Discharge: 2016-06-19 | Disposition: A | Discharge status: Home / Self Care | Payer: Medicare Other | Attending: Emergency Medicine | Admitting: Emergency Medicine

## 2016-06-18 DIAGNOSIS — I872 Venous insufficiency (chronic) (peripheral): Secondary | ICD-10-CM | POA: Insufficient documentation

## 2016-06-18 DIAGNOSIS — R21 Rash and other nonspecific skin eruption: Secondary | ICD-10-CM | POA: Diagnosis present

## 2016-06-18 LAB — CBC WITH DIFFERENTIAL/PLATELET
BASOS ABS: 0 10*3/uL (ref 0.0–0.1)
BASOS PCT: 0 %
EOS PCT: 6 %
Eosinophils Absolute: 0.5 10*3/uL (ref 0.0–0.7)
HCT: 37.3 % (ref 36.0–46.0)
Hemoglobin: 11.9 g/dL — ABNORMAL LOW (ref 12.0–15.0)
Lymphocytes Relative: 27 %
Lymphs Abs: 2.2 10*3/uL (ref 0.7–4.0)
MCH: 26.5 pg (ref 26.0–34.0)
MCHC: 31.9 g/dL (ref 30.0–36.0)
MCV: 83.1 fL (ref 78.0–100.0)
MONO ABS: 0.7 10*3/uL (ref 0.1–1.0)
Monocytes Relative: 9 %
Neutro Abs: 4.8 10*3/uL (ref 1.7–7.7)
Neutrophils Relative %: 58 %
PLATELETS: 276 10*3/uL (ref 150–400)
RBC: 4.49 MIL/uL (ref 3.87–5.11)
RDW: 15.9 % — AB (ref 11.5–15.5)
WBC: 8.1 10*3/uL (ref 4.0–10.5)

## 2016-06-18 MED ORDER — CEPHALEXIN 500 MG PO CAPS
500.0000 mg | ORAL_CAPSULE | Freq: Once | ORAL | Status: AC
  Administered 2016-06-18: 500 mg via ORAL
  Filled 2016-06-18: qty 1

## 2016-06-18 MED ORDER — FAMOTIDINE 20 MG PO TABS
20.0000 mg | ORAL_TABLET | Freq: Once | ORAL | Status: AC
Start: 2016-06-18 — End: 2016-06-18
  Administered 2016-06-18: 20 mg via ORAL
  Filled 2016-06-18: qty 1

## 2016-06-18 MED ORDER — DIPHENHYDRAMINE HCL 25 MG PO CAPS
25.0000 mg | ORAL_CAPSULE | Freq: Once | ORAL | Status: AC
  Administered 2016-06-18: 25 mg via ORAL
  Filled 2016-06-18: qty 1

## 2016-06-18 NOTE — ED Triage Notes (Signed)
Pt has been struggling with recurrent skin infections and bruising on bilateral lower legs and bilateral arms since last April. Pt sts the only thing that relieves the itching is diaper rash cream. Pt A&Ox4 and ambulatory. Pt sts no doctor has been able to give her a diagnosis.

## 2016-06-18 NOTE — ED Notes (Addendum)
Patient states the only thing that has seemed to help was antibiotics that were last prescribed back in August. Patient has recently had purple bruise like spots on bilateral arms.

## 2016-06-18 NOTE — ED Provider Notes (Signed)
Section DEPT Provider Note   CSN: [id number] Arrival date & time: 06/18/16  1724  By signing my name below, I, Dora Sims, attest that this documentation has been prepared under the direction and in the presence of physician practitioner, Delora Fuel, MD. Electronically Signed: Dora Sims, Scribe. 06/18/2016. 11:20 PM.  History   Chief Complaint Chief Complaint  Patient presents with  . Rash  . Recurrent Skin Infections    The history is provided by the patient. No language interpreter was used.     HPI Comments: Ellen Johnson is a 80 y.o. female who presents to the Emergency Department complaining of sudden onset, constant, bruising to her bilateral forearms and lower legs for the last several weeks. She reports she has had a pruritic rash and swelling to her bilateral lower legs for the last 3 months. She also reports her blood pressure was elevated shortly PTA. Pt reports she has seen 3 different dermatologists in [place] with no relief of her rash. Pt has been wearing panty hose per recommendation by her PCP with no relief of her itching her swelling. She notes she has tried OTC diaper rash cream with good relief of her rash. Pt also notes she has used benadryl with significant relief of her itching. She states she was prescribed a pill for pruritis by her PCP ~ 2 months ago that did not relieve her itching. She denies numbness, weakness, or any other associated symptoms.  Past Medical History:  Diagnosis Date  . GERD (gastroesophageal reflux disease)     Patient Active Problem List   Diagnosis Date Noted  . Colovaginal fistula 03/28/2016  . DIVERTICULOSIS OF COLON 07/30/2008  . DYSPHAGIA UNSPECIFIED 07/30/2008  . ABDOMINAL PAIN, LEFT LOWER QUADRANT 07/30/2008  . ANEMIA-NOS 05/11/2008  . HYPERTENSION, SYSTOLIC, BORDERLINE 0000000  . GERD 05/11/2008  . HEADACHE 05/11/2008  . COLONIC POLYPS, HX OF 11/28/2007    Past Surgical History:  Procedure  Laterality Date  . ABDOMINAL HYSTERECTOMY     TAH BSO  . FRACTURE SURGERY    . gall bladder removed      OB History    Gravida Para Term Preterm AB Living   4 4       4    SAB TAB Ectopic Multiple Live Births                   Home Medications    Prior to Admission medications   Medication Sig Start Date End Date Taking? Authorizing Provider  acidophilus (RISAQUAD) CAPS capsule Take by mouth daily.    Historical Provider, MD  calcium carbonate 200 MG capsule Take 250 mg by mouth 2 (two) times daily with a meal.    Historical Provider, MD  cholecalciferol (VITAMIN D) 1000 UNITS tablet Take 1,000 Units by mouth daily.    Historical Provider, MD  ciprofloxacin (CIPRO) 500 MG tablet Take 1 tablet (500 mg total) by mouth 2 (two) times daily. Patient not taking: Reported on 06/06/2016 01/31/16   Terrance Mass, MD  fish oil-omega-3 fatty acids 1000 MG capsule Take 2 g by mouth daily.    Historical Provider, MD  fluocinonide ointment (LIDEX) AB-123456789 % Apply 1 application topically 2 (two) times daily. 10/05/15   [name], MD  furosemide (LASIX) 20 MG tablet Take 1 tablet (20 mg total) by mouth daily. 05/30/16   Chelle Jeffery, PA-C  Ginkgo Biloba 40 MG TABS Take by mouth.    Historical Provider, MD  hydrOXYzine (ATARAX/VISTARIL) 25 MG tablet  Take 0.5-1 tablets (12.5-25 mg total) by mouth every 8 (eight) hours as needed for itching. 01/20/16   [name], MD  Multiple Vitamin (MULTIVITAMIN) tablet Take 1 tablet by mouth daily.    Historical Provider, MD  omeprazole (PRILOSEC) 40 MG capsule Take 40 mg by mouth daily.    Historical Provider, MD  ondansetron (ZOFRAN-ODT) 8 MG disintegrating tablet Take 1 tablet (8 mg total) by mouth every 8 (eight) hours as needed for nausea. 07/16/15   [name], MD  Probiotic Product (PROBIOTIC PO) Take by mouth.    Historical Provider, MD  promethazine (PHENERGAN) 12.5 MG tablet Take 1 tablet (12.5 mg total) by mouth every 8 (eight) hours as  needed for nausea or vomiting. Patient not taking: Reported on 06/06/2016 11/24/15   [name], MD  triamcinolone cream (KENALOG) 0.1 % APPLY 1 APPLICATION TOPICALLY 2 (TWO) TIMES DAILY. 06/12/16   Alveda Reasons, MD  vitamin C (ASCORBIC ACID) 500 MG tablet Take 500 mg by mouth daily. Takes only during winter months    Historical Provider, MD    Family History Family History  Problem Relation Age of Onset  . Stroke Mother   . Cancer Father     Colon  . Stroke Maternal Grandmother   . Breast cancer Paternal Grandmother     Social History Social History  Substance Use Topics  . Smoking status: Never Smoker  . Smokeless tobacco: Never Used  . Alcohol use 1.2 oz/week    2 Standard drinks or equivalent per week     Allergies   Valium [diazepam]   Review of Systems Review of Systems  Musculoskeletal: Positive for joint swelling (bilateral lower legs).  Skin: Positive for color change (bruising to lower legs and forearms) and rash (lower legs).  Neurological: Negative for weakness and numbness.  All other systems reviewed and are negative.   Physical Exam Updated Vital Signs BP 178/82 (BP Location: Left Arm)   Pulse 76   Temp 98.3 F (36.8 C) (Oral)   Resp 16   Ht 5\' 5"  (1.651 m)   Wt 204 lb 4 oz (92.6 kg)   SpO2 97%   BMI 33.99 kg/m   Physical Exam  Constitutional: She is oriented to person, place, and time. She appears well-developed and well-nourished.  HENT:  Head: Normocephalic and atraumatic.  Eyes: EOM are normal. Pupils are equal, round, and reactive to light.  Neck: Normal range of motion. Neck supple. No JVD present.  Cardiovascular: Normal rate, regular rhythm and normal heart sounds.   No murmur heard. Pulmonary/Chest: Effort normal and breath sounds normal. She has no wheezes. She has no rales. She exhibits no tenderness.  Abdominal: Soft. Bowel sounds are normal. She exhibits no distension and no mass. There is no tenderness.  Musculoskeletal:  Normal range of motion. She exhibits edema.  2+ pedal and pretibial edema. Mild erythema to the right lower leg. Moderate erythema to left lower leg.   Lymphadenopathy:    She has no cervical adenopathy.  Neurological: She is alert and oriented to person, place, and time. No cranial nerve deficit. She exhibits normal muscle tone. Coordination normal.  Skin: Skin is warm and dry.  Desquamation and thickening of skin of the feet. Scattered purpuric and petechial lesions on legs and arms.  Psychiatric: She has a normal mood and affect. Her behavior is normal. Judgment and thought content normal.  Nursing note and vitals reviewed.   ED Treatments / Results  Labs (all labs ordered are listed,  but only abnormal results are displayed) Labs Reviewed  COMPREHENSIVE METABOLIC PANEL - Abnormal; Notable for the following:       Result Value   ALT 13 (*)    All other components within normal limits  CBC WITH DIFFERENTIAL/PLATELET - Abnormal; Notable for the following:    Hemoglobin 11.9 (*)    RDW 15.9 (*)    All other components within normal limits     Procedures Procedures (including critical care time)  DIAGNOSTIC STUDIES: Oxygen Saturation is 97% on RA, normal by my interpretation.    COORDINATION OF CARE: 11:25 PM Discussed treatment plan with pt at bedside and pt agreed to plan.  Medications Ordered in ED Medications  diphenhydrAMINE (BENADRYL) capsule 25 mg (25 mg Oral Given 06/18/16 2353)  famotidine (PEPCID) tablet 20 mg (20 mg Oral Given 06/18/16 2353)  cephALEXin (KEFLEX) capsule 500 mg (500 mg Oral Given 06/18/16 2353)     Initial Impression / Assessment and Plan / ED Course  I have reviewed the triage vital signs and the nursing notes.  Pertinent labs & imaging results that were available during my care of the patient were reviewed by me and considered in my medical decision making (see chart for details).  Clinical Course   Stasis dermatitis with probable low-grade  cellulitis. Hyperkeratotic rash on the feet of uncertain cause. She has had extensive workup with the dermatologists without findings specific cause. There does appear to be new petechiae and purpura. We'll check screening labs to make sure she does not have from cytopenia. Old records are reviewed confirming management through patient's PCP of stasis dermatitis.  Platelet count has come back normal as has had WBC. She had been given diphenhydramine, famotidine, and cephalexin in the ED with some temporary improvement in itching. She is discharged with instructions to use over-the-counter nonsedating antihistamines once a day and H2 blockers twice a day to try to keep the itching under control. Continue to use diphenhydramine as needed for breakthrough itching. She is referred to dermatology at [facility] for another opinion.  I personally performed the services described in this documentation, which was scribed in my presence. The recorded information has been reviewed and is accurate.    Final Clinical Impressions(s) / ED Diagnoses   Final diagnoses:  Stasis dermatitis of both legs    New Prescriptions New Prescriptions   CEPHALEXIN (KEFLEX) 500 MG CAPSULE    Take 1 capsule (500 mg total) by mouth 4 (four) times daily.     Delora Fuel, MD [id number]

## 2016-06-19 LAB — COMPREHENSIVE METABOLIC PANEL
ALBUMIN: 4 g/dL (ref 3.5–5.0)
ALK PHOS: 73 U/L (ref 38–126)
ALT: 13 U/L — AB (ref 14–54)
ANION GAP: 6 (ref 5–15)
AST: 19 U/L (ref 15–41)
BUN: 17 mg/dL (ref 6–20)
CALCIUM: 9.4 mg/dL (ref 8.9–10.3)
CHLORIDE: 106 mmol/L (ref 101–111)
CO2: 24 mmol/L (ref 22–32)
Creatinine, Ser: 0.79 mg/dL (ref 0.44–1.00)
GFR calc Af Amer: >60 mL/min (ref >60)
GFR calc non Af Amer: >60 mL/min (ref >60)
GLUCOSE: 95 mg/dL (ref 65–99)
Potassium: 4 mmol/L (ref 3.5–5.1)
SODIUM: 136 mmol/L (ref 135–145)
Total Bilirubin: 0.7 mg/dL (ref 0.3–1.2)
Total Protein: 7 g/dL (ref 6.5–8.1)

## 2016-06-19 MED ORDER — CEPHALEXIN 500 MG PO CAPS: 500.0000 mg | ORAL_CAPSULE | Freq: Four times a day (QID) | ORAL | 0 refills | Status: DC

## 2016-06-19 MED ORDER — LORATADINE 10 MG PO TABS
10.0000 mg | ORAL_TABLET | Freq: Every day | ORAL | Status: DC
  Administered 2016-06-19: 10 mg via ORAL
  Filled 2016-06-19: qty 1

## 2016-06-19 NOTE — Discharge Instructions (Signed)
Take either Claritin or Zyrtec once a day. Take either Zantac 150 mg or Pepcid 20 mg twice a day. You may take Benadryl 25 mg eery four hours as needed for break-through itching.

## 2016-06-19 NOTE — ED Notes (Signed)
Provider notified of elevated BP

## 2016-06-19 NOTE — Telephone Encounter (Signed)
Patient was seen in an ER recently and was given an rx for antibiotic for leg infection "my circulation is good!"  Seeing a dermatologist 06/27/16 at [place].

## 2016-06-25 ENCOUNTER — Telehealth (HOSPITAL_BASED_OUTPATIENT_CLINIC_OR_DEPARTMENT_OTHER): Payer: Self-pay | Admitting: Emergency Medicine

## 2016-06-29 ENCOUNTER — Encounter (HOSPITAL_BASED_OUTPATIENT_CLINIC_OR_DEPARTMENT_OTHER): Payer: Medicare Other | Attending: Internal Medicine

## 2016-07-06 ENCOUNTER — Ambulatory Visit (INDEPENDENT_AMBULATORY_CARE_PROVIDER_SITE_OTHER): Payer: Medicare Other | Admitting: Family Medicine

## 2016-07-06 VITALS — BP 132/72 | HR 88 | Temp 98.3°F | Resp 20 | Ht 65.0 in | Wt 203.6 lb

## 2016-07-06 DIAGNOSIS — I872 Venous insufficiency (chronic) (peripheral): Secondary | ICD-10-CM | POA: Diagnosis not present

## 2016-07-06 DIAGNOSIS — E538 Deficiency of other specified B group vitamins: Secondary | ICD-10-CM

## 2016-07-06 DIAGNOSIS — D649 Anemia, unspecified: Secondary | ICD-10-CM | POA: Diagnosis not present

## 2016-07-06 LAB — HEMOGLOBIN AND HEMATOCRIT, BLOOD
HCT: 37 % (ref 35.0–45.0)
Hemoglobin: 12.2 g/dL (ref 11.7–15.5)

## 2016-07-06 MED ORDER — CYANOCOBALAMIN 1000 MCG/ML IJ SOLN: 1000.0000 ug | INTRAMUSCULAR | Status: DC

## 2016-07-06 MED ORDER — CYANOCOBALAMIN 1000 MCG/ML IJ SOLN
1000.0000 ug | INTRAMUSCULAR | Status: DC
  Administered 2016-07-06: 1000 ug via INTRAMUSCULAR

## 2016-07-06 NOTE — Patient Instructions (Signed)
     IF you received an x-ray today, you will receive an invoice from [facility]. Please contact [facility] at [phone] with questions or concerns regarding your invoice.   IF you received labwork today, you will receive an invoice from [facility]. Please contact [facility] at [phone] with questions or concerns regarding your invoice.   Our billing staff will not be able to assist you with questions regarding bills from these companies.  You will be contacted with the lab results as soon as they are available. The fastest way to get your results is to activate your My Chart account. Instructions are located on the last page of this paperwork. If you have not heard from us regarding the results in 2 weeks, please contact this office.      

## 2016-07-06 NOTE — Progress Notes (Signed)
Patient ID: Ellen Johnson, female    DOB: 1935/12/21, 80 y.o.   MRN: [id number]  PCP: No primary care provider on file.  Chief Complaint  Patient presents with  . Injections    b12   . Labs Only  . Rash    at base of leg near top of foot     Subjective:   HPI 80 year old female presents for hemoglobin/hematocrit recheck and B12 injection. She is an established patient here at [facility] and has previously been evaluated for venous stasis dermatitis and referred to dermatology and wound care by two previous providers and advised to wear compression hose. She has followed up with wound care and was advised that UNNA boots would not be beneficial. She reports that she has an upcoming appointment with dermatology.  She presented to the ED on 06/18/2016 for venous stasis dermatitis and received antibiotic treatment and was referred again to dermatology at [facility]. During this visit, her hemoglobin was mildly decreased and she was advised to follow-up for hemoglobin re-evaluation.  Social History   Social History  . Marital status: Widowed    Spouse name: N/A  . Number of children: N/A  . Years of education: N/A   Occupational History  . Not on file.   Social History Main Topics  . Smoking status: Never Smoker  . Smokeless tobacco: Never Used  . Alcohol use 1.2 oz/week    2 Standard drinks or equivalent per week  . Drug use: Unknown  . Sexual activity: No   Other Topics Concern  . Not on file   Social History Narrative  . No narrative on file    Family History  Problem Relation Age of Onset  . Stroke Mother   . Cancer Father     Colon  . Stroke Maternal Grandmother   . Breast cancer Paternal Grandmother    Review of Systems See HPI  Patient Active Problem List   Diagnosis Date Noted  . Colovaginal fistula 03/28/2016  . DIVERTICULOSIS OF COLON 07/30/2008  . DYSPHAGIA UNSPECIFIED 07/30/2008  . ABDOMINAL PAIN, LEFT LOWER QUADRANT 07/30/2008  . ANEMIA-NOS  05/11/2008  . HYPERTENSION, SYSTOLIC, BORDERLINE 0000000  . GERD 05/11/2008  . HEADACHE 05/11/2008  . COLONIC POLYPS, HX OF 11/28/2007     Prior to Admission medications   Medication Sig Start Date End Date Taking? Authorizing Provider  acidophilus (RISAQUAD) CAPS capsule Take by mouth daily.   Yes Historical Provider, MD  calcium carbonate 200 MG capsule Take 250 mg by mouth 2 (two) times daily with a meal.   Yes Historical Provider, MD  cephALEXin (KEFLEX) 500 MG capsule Take 1 capsule (500 mg total) by mouth 4 (four) times daily. 123456  Yes Delora Fuel, MD  cholecalciferol (VITAMIN D) 1000 UNITS tablet Take 1,000 Units by mouth daily.   Yes Historical Provider, MD  Cyanocobalamin (VITAMIN B 12 PO) Take 1 tablet by mouth daily.   Yes Historical Provider, MD  Cyanocobalamin (VITAMIN B-12 IJ) Inject 1 each as directed every 28 (twenty-eight) days.   Yes Historical Provider, MD  fish oil-omega-3 fatty acids 1000 MG capsule Take 2 g by mouth daily.   Yes Historical Provider, MD  furosemide (LASIX) 20 MG tablet Take 1 tablet (20 mg total) by mouth daily. 05/30/16  Yes Chelle Jeffery, PA-C  Ginkgo Biloba 40 MG TABS Take 1 tablet by mouth daily.    Yes Historical Provider, MD  Multiple Vitamin (MULTIVITAMIN) tablet Take 1 tablet by mouth daily.  Yes Historical Provider, MD  omeprazole (PRILOSEC) 40 MG capsule Take 40 mg by mouth daily.   Yes Historical Provider, MD  Probiotic Product (PROBIOTIC PO) Take by mouth.   Yes Historical Provider, MD  vitamin C (ASCORBIC ACID) 500 MG tablet Take 500 mg by mouth daily. Takes only during winter months   Yes Historical Provider, MD   Allergies  Allergen Reactions  . Valium [Diazepam] Other (See Comments)    Totally different personality      Objective:  Physical Exam  Constitutional: She is oriented to person, place, and time. She appears well-developed and well-nourished.  HENT:  Head: Normocephalic and atraumatic.  Right Ear: External ear  normal.  Left Ear: External ear normal.  Nose: Nose normal.  Eyes: Conjunctivae and EOM are normal. Pupils are equal, round, and reactive to light.  Neck: Normal range of motion. Neck supple.  Cardiovascular: Normal rate.   Pulmonary/Chest: Effort normal.  Musculoskeletal: Normal range of motion. She exhibits edema.  Bilateral lower legs edematous with pinpoint ares of ecchymosis and petechie  Negative for erythema expansion to unaffected area of legs which indicates cellulitis likely responded to therapy.  Neurological: She is alert and oriented to person, place, and time.  Skin: Skin is warm and dry.  Psychiatric: She has a normal mood and affect. Her behavior is normal. Judgment and thought content normal.   Vitals:   07/06/16 1555  BP: 132/72  Pulse: 88  Resp: 20  Temp: 98.3 F (36.8 C)   Assessment & Plan:  1. B12 deficiency  - cyanocobalamin ((VITAMIN B-12)) injection 1,000 mcg; Inject 1 mL (1,000 mcg total) into the muscle every 30 (thirty) days. -Return in 30 day for next B-12 injection  2. Low hemoglobin follow-up from prior ED visit on 06/18/2016 - Hemoglobin and Hematocrit  3. Venous stasis dermatitis of both lower extremities  -Recommended that she wear compression hose -Continue follow-up with dermatology   Follow-up as needed. Carroll Sage. [name], MSN, FNP-C [facility]

## 2016-07-09 ENCOUNTER — Telehealth: Payer: Self-pay

## 2016-07-09 ENCOUNTER — Telehealth: Payer: Self-pay | Admitting: Emergency Medicine

## 2016-07-09 NOTE — Telephone Encounter (Signed)
Left message with normal blood work results

## 2016-07-09 NOTE — Telephone Encounter (Signed)
PATIENT STATES SHE WAS IN THE OFFICE AND SAW [facility] ON Friday. SHE WOULD LIKE TO GET HER LAB RESULTS. SHE SAID SHE REALLY DID LIKE [facility]. BEST PHONE [phone] (CELL)  PHARMACY CHOICE IS [facility] ON [address] ([place])  [place]

## 2016-07-10 ENCOUNTER — Ambulatory Visit: Payer: Medicare Other

## 2016-07-17 ENCOUNTER — Encounter (HOSPITAL_COMMUNITY): Payer: Self-pay | Admitting: Emergency Medicine

## 2016-07-17 ENCOUNTER — Emergency Department (HOSPITAL_COMMUNITY): Payer: Medicare Other

## 2016-07-17 ENCOUNTER — Inpatient Hospital Stay (HOSPITAL_COMMUNITY)
Admission: EM | Admit: 2016-07-17 | Discharge: 2016-07-20 | DRG: 394 | Disposition: A | Discharge status: Home / Self Care | Payer: Medicare Other | Attending: Internal Medicine | Admitting: Internal Medicine

## 2016-07-17 DIAGNOSIS — K219 Gastro-esophageal reflux disease without esophagitis: Secondary | ICD-10-CM | POA: Diagnosis not present

## 2016-07-17 DIAGNOSIS — Z823 Family history of stroke: Secondary | ICD-10-CM | POA: Diagnosis not present

## 2016-07-17 DIAGNOSIS — K5732 Diverticulitis of large intestine without perforation or abscess without bleeding: Secondary | ICD-10-CM | POA: Diagnosis present

## 2016-07-17 DIAGNOSIS — N824 Other female intestinal-genital tract fistulae: Secondary | ICD-10-CM | POA: Diagnosis present

## 2016-07-17 DIAGNOSIS — K5792 Diverticulitis of intestine, part unspecified, without perforation or abscess without bleeding: Secondary | ICD-10-CM | POA: Diagnosis present

## 2016-07-17 DIAGNOSIS — D72829 Elevated white blood cell count, unspecified: Secondary | ICD-10-CM | POA: Diagnosis not present

## 2016-07-17 DIAGNOSIS — K210 Gastro-esophageal reflux disease with esophagitis: Secondary | ICD-10-CM | POA: Diagnosis not present

## 2016-07-17 DIAGNOSIS — R0602 Shortness of breath: Secondary | ICD-10-CM | POA: Diagnosis not present

## 2016-07-17 DIAGNOSIS — Z803 Family history of malignant neoplasm of breast: Secondary | ICD-10-CM | POA: Diagnosis not present

## 2016-07-17 DIAGNOSIS — Z800 Family history of malignant neoplasm of digestive organs: Secondary | ICD-10-CM | POA: Diagnosis not present

## 2016-07-17 DIAGNOSIS — I10 Essential (primary) hypertension: Secondary | ICD-10-CM | POA: Diagnosis present

## 2016-07-17 DIAGNOSIS — N823 Fistula of vagina to large intestine: Secondary | ICD-10-CM | POA: Diagnosis not present

## 2016-07-17 DIAGNOSIS — R109 Unspecified abdominal pain: Secondary | ICD-10-CM | POA: Diagnosis not present

## 2016-07-17 DIAGNOSIS — K5790 Diverticulosis of intestine, part unspecified, without perforation or abscess without bleeding: Secondary | ICD-10-CM | POA: Diagnosis not present

## 2016-07-17 HISTORY — DX: Essential (primary) hypertension: I10

## 2016-07-17 HISTORY — DX: Diverticulosis of intestine, part unspecified, without perforation or abscess without bleeding: K57.90

## 2016-07-17 LAB — CBC WITH DIFFERENTIAL/PLATELET
Basophils Absolute: 0 K/uL (ref 0.0–0.1)
Basophils Relative: 0 %
Eosinophils Absolute: 0.2 K/uL (ref 0.0–0.7)
Eosinophils Relative: 1 %
HCT: 36.7 % (ref 36.0–46.0)
Hemoglobin: 12.2 g/dL (ref 12.0–15.0)
Lymphocytes Relative: 13 %
Lymphs Abs: 1.7 K/uL (ref 0.7–4.0)
MCH: 26.7 pg (ref 26.0–34.0)
MCHC: 33.2 g/dL (ref 30.0–36.0)
MCV: 80.3 fL (ref 78.0–100.0)
Monocytes Absolute: 0.7 K/uL (ref 0.1–1.0)
Monocytes Relative: 5 %
Neutro Abs: 10.5 K/uL — ABNORMAL HIGH (ref 1.7–7.7)
Neutrophils Relative %: 81 %
Platelets: 269 K/uL (ref 150–400)
RBC: 4.57 MIL/uL (ref 3.87–5.11)
RDW: 15.4 % (ref 11.5–15.5)
WBC: 13.1 K/uL — ABNORMAL HIGH (ref 4.0–10.5)

## 2016-07-17 LAB — COMPREHENSIVE METABOLIC PANEL WITH GFR
ALT: 14 U/L (ref 14–54)
AST: 20 U/L (ref 15–41)
Albumin: 3.7 g/dL (ref 3.5–5.0)
Alkaline Phosphatase: 73 U/L (ref 38–126)
Anion gap: 11 (ref 5–15)
BUN: 18 mg/dL (ref 6–20)
CO2: 23 mmol/L (ref 22–32)
Calcium: 10.2 mg/dL (ref 8.9–10.3)
Chloride: 102 mmol/L (ref 101–111)
Creatinine, Ser: 1 mg/dL (ref 0.44–1.00)
GFR calc Af Amer: >60 mL/min
GFR calc non Af Amer: 53 mL/min — ABNORMAL LOW
Glucose, Bld: 100 mg/dL — ABNORMAL HIGH (ref 65–99)
Potassium: 4 mmol/L (ref 3.5–5.1)
Sodium: 136 mmol/L (ref 135–145)
Total Bilirubin: 0.8 mg/dL (ref 0.3–1.2)
Total Protein: 6.8 g/dL (ref 6.5–8.1)

## 2016-07-17 LAB — LIPASE, BLOOD: Lipase: 24 U/L (ref 11–51)

## 2016-07-17 MED ORDER — ONDANSETRON HCL 4 MG/2ML IJ SOLN
4.0000 mg | Freq: Once | INTRAMUSCULAR | Status: AC
  Administered 2016-07-17: 4 mg via INTRAVENOUS
  Filled 2016-07-17: qty 2

## 2016-07-17 MED ORDER — CIPROFLOXACIN IN D5W 400 MG/200ML IV SOLN
400.0000 mg | Freq: Once | INTRAVENOUS | Status: AC
  Administered 2016-07-17: 400 mg via INTRAVENOUS
  Filled 2016-07-17: qty 200

## 2016-07-17 MED ORDER — IOPAMIDOL (ISOVUE-300) INJECTION 61%
INTRAVENOUS | Status: AC
  Administered 2016-07-17: 100 mL
  Filled 2016-07-17: qty 100

## 2016-07-17 MED ORDER — HYDROMORPHONE HCL 2 MG/ML IJ SOLN
1.0000 mg | Freq: Once | INTRAMUSCULAR | Status: AC
  Administered 2016-07-17: 1 mg via INTRAVENOUS
  Filled 2016-07-17: qty 1

## 2016-07-17 MED ORDER — HYDROMORPHONE HCL 2 MG/ML IJ SOLN
1.0000 mg | Freq: Once | INTRAMUSCULAR | Status: DC
  Filled 2016-07-17: qty 1

## 2016-07-17 MED ORDER — METRONIDAZOLE IN NACL 5-0.79 MG/ML-% IV SOLN
500.0000 mg | Freq: Once | INTRAVENOUS | Status: AC
  Administered 2016-07-17: 500 mg via INTRAVENOUS
  Filled 2016-07-17: qty 100

## 2016-07-17 NOTE — ED Triage Notes (Signed)
Pt here with severe lower abd pain and increased amount of stool coming from vaginal area; pt sts hx of fistula; pt appears in severe pain

## 2016-07-17 NOTE — ED Notes (Signed)
Pt asked for nausea medicine. Informed Candace - RN.

## 2016-07-17 NOTE — ED Provider Notes (Signed)
[facility] DEPT Provider Note   CSN: [id number] Arrival date & time: 07/17/16  1802     History   Chief Complaint Chief Complaint  Patient presents with  . Abdominal Pain    HPI Ellen Johnson is a 80 y.o. female with a past medical history of HTN, diverticulosis, colovaginal fistula who presents to the ED today complaining of abdominal pain and stool coming from vagina. Patient states that approximately 8 months ago she began having left lower abdominal pain in his stools coming from her vagina. She was referred to a gynecologist by her GI provider, Dr. Benson Norway to further evaluate. I elicited she also had a small rectovaginal fistula. She was then referred to a colorectal surgeon for evaluation. However, 2 months ago her GI provider put her on antibiotics which she cannot recall. She states this completely cleared up her symptoms since surgery was never performed. However, approximately one week ago she noticed a quarter-sized amount of blood on her feminine pad. She scheduled an appointment with her GI doctor for tomorrow. Unfortunately, this morning while walking her dog she began exhibiting chills and dizziness. She had the urge to have a bowel movement but was unable to go. She suddenly felt severe lower abdominal cramping that started in her left lower quadrant and radiated across her abdomen. Shortly after this a large amount of stool began coming from her vagina as she came to the ED for further evaluation.   HPI  Past Medical History:  Diagnosis Date  . GERD (gastroesophageal reflux disease)     Patient Active Problem List   Diagnosis Date Noted  . Colovaginal fistula 03/28/2016  . DIVERTICULOSIS OF COLON 07/30/2008  . DYSPHAGIA UNSPECIFIED 07/30/2008  . ABDOMINAL PAIN, LEFT LOWER QUADRANT 07/30/2008  . ANEMIA-NOS 05/11/2008  . HYPERTENSION, SYSTOLIC, BORDERLINE 0000000  . GERD 05/11/2008  . HEADACHE 05/11/2008  . COLONIC POLYPS, HX OF 11/28/2007    Past Surgical  History:  Procedure Laterality Date  . ABDOMINAL HYSTERECTOMY     TAH BSO  . FRACTURE SURGERY    . gall bladder removed      OB History    Gravida Para Term Preterm AB Living   4 4       4    SAB TAB Ectopic Multiple Live Births                   Home Medications    Prior to Admission medications   Medication Sig Start Date End Date Taking? Authorizing Provider  acidophilus (RISAQUAD) CAPS capsule Take 1 capsule by mouth daily.    Yes Historical Provider, MD  cholecalciferol (VITAMIN D) 1000 UNITS tablet Take 1,000 Units by mouth daily.   Yes Historical Provider, MD  Cyanocobalamin (VITAMIN B 12 PO) Take 1 tablet by mouth daily.   Yes Historical Provider, MD  fish oil-omega-3 fatty acids 1000 MG capsule Take 1 g by mouth daily.    Yes Historical Provider, MD  furosemide (LASIX) 20 MG tablet Take 1 tablet (20 mg total) by mouth daily. 05/30/16  Yes Chelle Jeffery, PA-C  Ginkgo Biloba 40 MG TABS Take 1 tablet by mouth daily.    Yes Historical Provider, MD  IRON PO Take 1 tablet by mouth daily.   Yes Historical Provider, MD  Multiple Vitamin (MULTIVITAMIN) tablet Take 1 tablet by mouth daily.   Yes Historical Provider, MD  omeprazole (PRILOSEC) 40 MG capsule Take 40 mg by mouth daily.   Yes Historical Provider, MD  vitamin C (  ASCORBIC ACID) 500 MG tablet Take 500 mg by mouth daily. Takes only during winter months   Yes Historical Provider, MD  cephALEXin (KEFLEX) 500 MG capsule Take 1 capsule (500 mg total) by mouth 4 (four) times daily. Patient not taking: Reported on 07/17/2016 123456   Delora Fuel, MD    Family History Family History  Problem Relation Age of Onset  . Stroke Mother   . Cancer Father     Colon  . Stroke Maternal Grandmother   . Breast cancer Paternal Grandmother     Social History Social History  Substance Use Topics  . Smoking status: Never Smoker  . Smokeless tobacco: Never Used  . Alcohol use 1.2 oz/week    2 Standard drinks or equivalent per week       Allergies   Valium [diazepam]   Review of Systems Review of Systems   Physical Exam Updated Vital Signs BP 139/83   Pulse 75   Temp 98 F (36.7 C) (Oral)   Resp 16   SpO2 96%   Physical Exam  Constitutional: She is oriented to person, place, and time. She appears well-developed and well-nourished. No distress.  HENT:  Head: Normocephalic and atraumatic.  Mouth/Throat: No oropharyngeal exudate.  Eyes: Conjunctivae and EOM are normal. Pupils are equal, round, and reactive to light. Right eye exhibits no discharge. Left eye exhibits no discharge. No scleral icterus.  Cardiovascular: Normal rate, regular rhythm, normal heart sounds and intact distal pulses.  Exam reveals no gallop and no friction rub.   No murmur heard. Pulmonary/Chest: Effort normal and breath sounds normal. No respiratory distress. She has no wheezes. She has no rales. She exhibits no tenderness.  Abdominal: Soft. She exhibits no distension. There is tenderness ( LLQ and suprpubic TTP). There is no guarding.  Genitourinary:  Genitourinary Comments: Copious stool emerging from vaginal vault. Significant tenderness on bimanual exam, limiting exam.  Musculoskeletal: Normal range of motion. She exhibits no edema.  Neurological: She is alert and oriented to person, place, and time.  Skin: Skin is warm and dry. No rash noted. She is not diaphoretic. No erythema. No pallor.  Psychiatric: She has a normal mood and affect. Her behavior is normal.  Nursing note and vitals reviewed.    ED Treatments / Results  Labs (all labs ordered are listed, but only abnormal results are displayed) Labs Reviewed  CBC WITH DIFFERENTIAL/PLATELET - Abnormal; Notable for the following:       Result Value   WBC 13.1 (*)    Neutro Abs 10.5 (*)    All other components within normal limits  COMPREHENSIVE METABOLIC PANEL  LIPASE, BLOOD  URINALYSIS, ROUTINE W REFLEX MICROSCOPIC (NOT AT [facility])    EKG  EKG Interpretation None        Radiology Ct Abdomen Pelvis W Contrast  Result Date: 07/17/2016 CLINICAL DATA:  Stool coming from vagina. History of diverticulitis with fistula. EXAM: CT ABDOMEN AND PELVIS WITH CONTRAST TECHNIQUE: Multidetector CT imaging of the abdomen and pelvis was performed using the standard protocol following bolus administration of intravenous contrast. CONTRAST:  100 ml ISOVUE-300 IOPAMIDOL (ISOVUE-300) INJECTION 61% COMPARISON:  02/21/2016 FINDINGS: Lower chest: Mild dependent changes in the lung bases. Small esophageal hiatal hernia. Hepatobiliary: No focal liver abnormality is seen. Status post cholecystectomy. No biliary dilatation. Pancreas: Unremarkable. No pancreatic ductal dilatation or surrounding inflammatory changes. Spleen: Normal in size without focal abnormality. Adrenals/Urinary Tract: No adrenal gland nodules. Scarring in the lower pole left kidney. Sub cm cysts in  the kidneys. No hydronephrosis or hydroureter. Bladder wall is not thickened and no filling defects are demonstrated. Stomach/Bowel: Stomach and small bowel are decompressed. Stool-filled colon with scattered diverticula. The sigmoid colon demonstrates diffuse wall thickening with multiple diverticula. There is progression since previous study. Changes suggest regional colitis or diverticulitis. There is stranding demonstrated throughout the fat in the pelvis around the sigmoid colon. No discrete abscess is identified. Sigmoid colon is contiguous with the left side of the vagina with stranding demonstrated between the sigmoid colon and the vagina. Gas within the area stranding consistent with fistula. Gas is present within the vagina. Appendix is normal. Vascular/Lymphatic: Aortic atherosclerosis. No enlarged abdominal or pelvic lymph nodes. Reproductive: Status post hysterectomy. No adnexal masses. Other: No abdominal wall hernia or abnormality. No abdominopelvic ascites. Musculoskeletal: Diffuse degenerative changes throughout the  lumbar spine. Slight anterior subluxation at L4-5 probably is degenerative. IMPRESSION: Diffuse wall thickening of the sigmoid colon with diverticulosis. Changes may represent diverticulitis or regional colitis. Stranding in the pelvic fat without discrete abscess. Sigmoid colovaginal fistula is demonstrated with gas in the fistula tract and in the vagina. Electronically Signed   By: Lucienne Capers M.D.   On: 07/17/2016 21:28    Procedures Procedures (including critical care time)  Medications Ordered in ED Medications  ciprofloxacin (CIPRO) IVPB 400 mg (not administered)  metroNIDAZOLE (FLAGYL) IVPB 500 mg (not administered)  ondansetron (ZOFRAN) injection 4 mg (not administered)  iopamidol (ISOVUE-300) 61 % injection (not administered)  HYDROmorphone (DILAUDID) injection 1 mg (1 mg Intravenous Given 07/17/16 1910)     Initial Impression / Assessment and Plan / ED Course  I have reviewed the triage vital signs and the nursing notes.  Pertinent labs & imaging results that were available during my care of the patient were reviewed by me and considered in my medical decision making (see chart for details).  Clinical Course    80 y.o F presents to the ED today c/o severe lower abdominal pain and stool coming from vagina. On presentation to ED pt appears very uncomfortable. Abd is soft but very tender in LLQ and suprapubic region. Copious stool coming from vagina. Pt has hx of diverticulitis and colovaginal fistula that has not been repaired. Pt afebrile. VSS. WBC elevated at 13.1. No metabolic derrangement. CT reveals Diffuse wall thickening of the sigmoid colon with diverticulosis, likely diverticulitis or regional colitis. Stranding in the pelvic fat without discrete abscess. Sigmoid colovaginal fistula is also demonstrated with gas in the fistula tract and in the vagina. Patient was given pain medication as well as IV Cipro and Flagyl. Spoke with Dr. Kieth Brightly with general surgery who states  that pt does not need surgery acutely.  Spoke with Dr. Tamala Julian with hospitalist who will admit patient to [facility] for further management. Patient is currently hemodynamically stable and awaiting bed placement.  Case discussed with Dr. Jeanell Sparrow who agrees with treatment plan.  Final Clinical Impressions(s) / ED Diagnoses   Final diagnoses:  Essential hypertension  Gastroesophageal reflux disease, esophagitis presence not specified  SOB (shortness of breath)    New Prescriptions New Prescriptions   No medications on file     Carlos Levering, PA-C 07/19/16 0020    Pattricia Boss, MD 07/26/16 [phone]

## 2016-07-17 NOTE — ED Notes (Signed)
Admitting MD at bedside.

## 2016-07-17 NOTE — H&P (Addendum)
History and Physical    Ellen Johnson [id number] DOB: 02/20/1936 DOA: 07/17/2016  Referring MD/NP/PA: Donnald Garre PA-C PCP: Pcp Not In System  Patient coming from: home  Chief Complaint: Abdominal pain  HPI: Ellen Johnson is a 80 y.o. female with medical history significant of HTN, diverticulosis, colovaginal fistula, and GERD; who presents with complaints of lower abdominal pain and stool coming out of her vagina. Patient notes that she been followed by Dr. Benson Norway of gastroenterology for his history diverticulitis and colovaginal fistula over 3 months ago. At that time she was evaluated by GI and OB/GYN, but it was recommended patient be placed on antibiotics first. If that antibiotics were not successful and patient will would be referred for surgery. She reports after being placed on antibiotics it resolved symptoms. Then, patient notes that she was taken off antibiotics for 1 month. However, this last month she had been on cephalexin and completed her last dose yesterday for a rash of her bilateral lower extremities. The rash. To be related to a vaccine and she is currently being set up to be evaluated by dermatologist at [facility] after previous evaluations by dermatology revealed no clear cause of symptoms.  Patient notes that last week she had a quarter size amount of blood passed the vagina. She notified her gastroenterologist and at that time they recommended for her to follow-up with them tomorrow at 3 PM. However, this afternoon patient developed lower abdominal cramping and had acute onset of stool pouring out of her vagina. Associated symptoms include chills, dizziness, and constipation. She has not had a bowel movement in a few days and had tried using a suppository prior to the onset of stool passing from her vagina. She came to the emergency department for further evaluation.  ED Course: Upon admission to the emergency department patient was seen to be  afebrile, with bilateral relatively within normal limits. Lab work revealed WBC of 13.1, BUN 18, creatinine 1, and all other vitals relatively within normal limits. CT scan of the abdomen showed diverticulitis with a colovaginal fistula and general surgery was consulted and will see the patient. Patient was placed on empiric antibiotics of metronidazole on ciprofloxacin.  Review of Systems: As per HPI otherwise 10 point review of systems negative.   Past Medical History:  Diagnosis Date  . GERD (gastroesophageal reflux disease)     Past Surgical History:  Procedure Laterality Date  . ABDOMINAL HYSTERECTOMY     TAH BSO  . FRACTURE SURGERY    . gall bladder removed       reports that she has never smoked. She has never used smokeless tobacco. She reports that she drinks about 1.2 oz of alcohol per week . Her drug history is not on file.  Allergies  Allergen Reactions  . Valium [Diazepam] Other (See Comments)    Totally different personality    Family History  Problem Relation Age of Onset  . Stroke Mother   . Cancer Father     Colon  . Stroke Maternal Grandmother   . Breast cancer Paternal Grandmother     Prior to Admission medications   Medication Sig Start Date End Date Taking? Authorizing Provider  acidophilus (RISAQUAD) CAPS capsule Take 1 capsule by mouth daily.    Yes Historical Provider, MD  cholecalciferol (VITAMIN D) 1000 UNITS tablet Take 1,000 Units by mouth daily.   Yes Historical Provider, MD  Cyanocobalamin (VITAMIN B 12 PO) Take 1 tablet by mouth daily.  Yes Historical Provider, MD  fish oil-omega-3 fatty acids 1000 MG capsule Take 1 g by mouth daily.    Yes Historical Provider, MD  furosemide (LASIX) 20 MG tablet Take 1 tablet (20 mg total) by mouth daily. 05/30/16  Yes Chelle Jeffery, PA-C  Ginkgo Biloba 40 MG TABS Take 1 tablet by mouth daily.    Yes Historical Provider, MD  IRON PO Take 1 tablet by mouth daily.   Yes Historical Provider, MD  Multiple  Vitamin (MULTIVITAMIN) tablet Take 1 tablet by mouth daily.   Yes Historical Provider, MD  omeprazole (PRILOSEC) 40 MG capsule Take 40 mg by mouth daily.   Yes Historical Provider, MD  vitamin C (ASCORBIC ACID) 500 MG tablet Take 500 mg by mouth daily. Takes only during winter months   Yes Historical Provider, MD  cephALEXin (KEFLEX) 500 MG capsule Take 1 capsule (500 mg total) by mouth 4 (four) times daily. Patient not taking: Reported on 07/17/2016 [id number]   Delora Fuel, MD    Physical Exam:    Constitutional: Elderly female who appears ill, but nontoxic. Patient able to follow commands.  Vitals:   07/17/16 1915 07/17/16 2030 07/17/16 2154 07/17/16 2200  BP: 139/83 137/64 156/71 167/81  Pulse: 75 78 79 78  Resp:   18   Temp:      TempSrc:      SpO2: 96% 99% 99% 100%   Eyes: PERRL, lids and conjunctivae normal ENMT: Mucous membranes are moist. Posterior pharynx clear of any exudate or lesions.Normal dentition.  Neck: normal, supple, no masses, no thyromegaly Respiratory: clear to auscultation bilaterally, no wheezing, no crackles. Normal respiratory effort. No accessory muscle use.  Cardiovascular: Regular rate and rhythm, no murmurs / rubs / gallops. No extremity edema. 2+ pedal pulses. No carotid bruits.  Abdomen:  tenderness to palpation of lower abdomen, no masses palpated. No hepatosplenomegaly. Bowel sounds positive. GU exam deferred  Musculoskeletal: no clubbing / cyanosis. No joint deformity upper and lower extremities. Good ROM, no contractures. Normal muscle tone.  Skin: no rashes, lesions, ulcers. No induration Neurologic: CN 2-12 grossly intact. Sensation intact, DTR normal. Strength 5/5 in all 4.  Psychiatric: Normal judgment and insight. Alert and oriented x 3. Normal mood.     Labs on Admission: I have personally reviewed following labs and imaging studies  CBC:  Recent Labs Lab 07/17/16 1858  WBC 13.1*  NEUTROABS 10.5*  HGB 12.2  HCT 36.7  MCV 80.3  PLT  [id number]   Basic Metabolic Panel:  Recent Labs Lab 07/17/16 1858  NA 136  K 4.0  CL 102  CO2 23  GLUCOSE 100*  BUN 18  CREATININE 1.00  CALCIUM 10.2   GFR: Estimated Creatinine Clearance: 53 mL/min (by C-G formula based on SCr of 1 mg/dL). Liver Function Tests:  Recent Labs Lab 07/17/16 1858  AST 20  ALT 14  ALKPHOS 73  BILITOT 0.8  PROT 6.8  ALBUMIN 3.7    Recent Labs Lab 07/17/16 1858  LIPASE 24   No results for input(s): AMMONIA in the last 168 hours. Coagulation Profile: No results for input(s): INR, PROTIME in the last 168 hours. Cardiac Enzymes: No results for input(s): CKTOTAL, CKMB, CKMBINDEX, TROPONINI in the last 168 hours. BNP (last 3 results) No results for input(s): PROBNP in the last 8760 hours. HbA1C: No results for input(s): HGBA1C in the last 72 hours. CBG: No results for input(s): GLUCAP in the last 168 hours. Lipid Profile: No results for input(s): CHOL, HDL, LDLCALC,  TRIG, CHOLHDL, LDLDIRECT in the last 72 hours. Thyroid Function Tests: No results for input(s): TSH, T4TOTAL, FREET4, T3FREE, THYROIDAB in the last 72 hours. Anemia Panel: No results for input(s): VITAMINB12, FOLATE, FERRITIN, TIBC, IRON, RETICCTPCT in the last 72 hours. Urine analysis:    Component Value Date/Time   BILIRUBINUR small (A) 12/27/2015 1848   BILIRUBINUR neg 01/25/2015 1833   KETONESUR trace (5) (A) 12/27/2015 1848   PROTEINUR =100 (A) 12/27/2015 1848   PROTEINUR neg 01/25/2015 1833   UROBILINOGEN 4.0 12/27/2015 1848   NITRITE Negative 12/27/2015 1848   NITRITE neg 01/25/2015 1833   LEUKOCYTESUR moderate (2+) (A) 12/27/2015 1848   Sepsis Labs: No results found for this or any previous visit (from the past 240 hour(s)).   Radiological Exams on Admission: Ct Abdomen Pelvis W Contrast  Result Date: 07/17/2016 CLINICAL DATA:  Stool coming from vagina. History of diverticulitis with fistula. EXAM: CT ABDOMEN AND PELVIS WITH CONTRAST TECHNIQUE: Multidetector  CT imaging of the abdomen and pelvis was performed using the standard protocol following bolus administration of intravenous contrast. CONTRAST:  100 ml ISOVUE-300 IOPAMIDOL (ISOVUE-300) INJECTION 61% COMPARISON:  02/21/2016 FINDINGS: Lower chest: Mild dependent changes in the lung bases. Small esophageal hiatal hernia. Hepatobiliary: No focal liver abnormality is seen. Status post cholecystectomy. No biliary dilatation. Pancreas: Unremarkable. No pancreatic ductal dilatation or surrounding inflammatory changes. Spleen: Normal in size without focal abnormality. Adrenals/Urinary Tract: No adrenal gland nodules. Scarring in the lower pole left kidney. Sub cm cysts in the kidneys. No hydronephrosis or hydroureter. Bladder wall is not thickened and no filling defects are demonstrated. Stomach/Bowel: Stomach and small bowel are decompressed. Stool-filled colon with scattered diverticula. The sigmoid colon demonstrates diffuse wall thickening with multiple diverticula. There is progression since previous study. Changes suggest regional colitis or diverticulitis. There is stranding demonstrated throughout the fat in the pelvis around the sigmoid colon. No discrete abscess is identified. Sigmoid colon is contiguous with the left side of the vagina with stranding demonstrated between the sigmoid colon and the vagina. Gas within the area stranding consistent with fistula. Gas is present within the vagina. Appendix is normal. Vascular/Lymphatic: Aortic atherosclerosis. No enlarged abdominal or pelvic lymph nodes. Reproductive: Status post hysterectomy. No adnexal masses. Other: No abdominal wall hernia or abnormality. No abdominopelvic ascites. Musculoskeletal: Diffuse degenerative changes throughout the lumbar spine. Slight anterior subluxation at L4-5 probably is degenerative. IMPRESSION: Diffuse wall thickening of the sigmoid colon with diverticulosis. Changes may represent diverticulitis or regional colitis. Stranding in  the pelvic fat without discrete abscess. Sigmoid colovaginal fistula is demonstrated with gas in the fistula tract and in the vagina. Electronically Signed   By: Lucienne Capers M.D.   On: 07/17/2016 21:28      Assessment/Plan Diverticulitis/ Colovaginal fistula: Acute on chronic. Patient complains of lower crampy abdominal pain. CT scan shows acute diverticulitis - Admit to a MedSurg bed - Continue empiric antibiotic ciprofloxacin and metronidazole  - Appreciate surgery consultation, will follow-up recommendations - May want to notify patient's gastroenterologist Dr. Benson Norway  in a.m.   Leukocytosis: WBC elevated at 13.1.  likely secondary to above - Repeat CBC in a.m.  GERD - Continue pharmacy substitution of Protonix  DVT prophylaxis: lovenox Code Status: Full Family Communication: no family present at bedside Disposition Plan: TBD Consults called: Surgery Admission status: Observation  Norval Morton MD [facility] Pager [phone]  If 7PM-7AM, please contact night-coverage [email or web address] Password [email or web address]  07/17/2016, 11:44 PM

## 2016-07-17 NOTE — Consult Note (Signed)
Reason for Consult:diverticulitis Referring Physician: Pattricia Boss, MD  Ellen Johnson is an 80 y.o. female.  HPI: 80 yo female with known history of diverticulitis and colovaginal fistula presents with worsening abdominal pain and stool from vagina. She was scheduled for surgery back in May but had resolution and therefore decided to delay the surgery. Since then she has not had symptoms until 06/16/16 when she had mild abdominal pain and stool coming from her vagina. She denies fevers, chills, nausea or vomiting. She was having a lot of pain in the Er but that is much improved now.  She also notes a reaction to pneumovax with break out on her legs recently  Past Medical History:  Diagnosis Date  . GERD (gastroesophageal reflux disease)     Past Surgical History:  Procedure Laterality Date  . ABDOMINAL HYSTERECTOMY     TAH BSO  . FRACTURE SURGERY    . gall bladder removed      Family History  Problem Relation Age of Onset  . Stroke Mother   . Cancer Father     Colon  . Stroke Maternal Grandmother   . Breast cancer Paternal Grandmother     Social History:  reports that she has never smoked. She has never used smokeless tobacco. She reports that she drinks about 1.2 oz of alcohol per week . Her drug history is not on file.  Allergies:  Allergies  Allergen Reactions  . Valium [Diazepam] Other (See Comments)    Totally different personality    Medications: I have reviewed the patient's current medications.  Results for orders placed or performed during the hospital encounter of 07/17/16 (from the past 48 hour(s))  Comprehensive metabolic panel     Status: Abnormal   Collection Time: 07/17/16  6:58 PM  Result Value Ref Range   Sodium 136 135 - 145 mmol/L   Potassium 4.0 3.5 - 5.1 mmol/L   Chloride 102 101 - 111 mmol/L   CO2 23 22 - 32 mmol/L   Glucose, Bld 100 (H) 65 - 99 mg/dL   BUN 18 6 - 20 mg/dL   Creatinine, Ser 1.00 0.44 - 1.00 mg/dL   Calcium 10.2 8.9 - 10.3  mg/dL   Total Protein 6.8 6.5 - 8.1 g/dL   Albumin 3.7 3.5 - 5.0 g/dL   AST 20 15 - 41 U/L   ALT 14 14 - 54 U/L   Alkaline Phosphatase 73 38 - 126 U/L   Total Bilirubin 0.8 0.3 - 1.2 mg/dL   GFR calc non Af Amer 53 (L) >60 mL/min   GFR calc Af Amer >60 >60 mL/min    Comment: (NOTE) The eGFR has been calculated using the CKD EPI equation. This calculation has not been validated in all clinical situations. eGFR's persistently <60 mL/min signify possible Chronic Kidney Disease.    Anion gap 11 5 - 15  CBC with Differential     Status: Abnormal   Collection Time: 07/17/16  6:58 PM  Result Value Ref Range   WBC 13.1 (H) 4.0 - 10.5 K/uL   RBC 4.57 3.87 - 5.11 MIL/uL   Hemoglobin 12.2 12.0 - 15.0 g/dL   HCT 36.7 36.0 - 46.0 %   MCV 80.3 78.0 - 100.0 fL   MCH 26.7 26.0 - 34.0 pg   MCHC 33.2 30.0 - 36.0 g/dL   RDW 15.4 11.5 - 15.5 %   Platelets 269 150 - 400 K/uL   Neutrophils Relative % 81 %   Neutro Abs  10.5 (H) 1.7 - 7.7 K/uL   Lymphocytes Relative 13 %   Lymphs Abs 1.7 0.7 - 4.0 K/uL   Monocytes Relative 5 %   Monocytes Absolute 0.7 0.1 - 1.0 K/uL   Eosinophils Relative 1 %   Eosinophils Absolute 0.2 0.0 - 0.7 K/uL   Basophils Relative 0 %   Basophils Absolute 0.0 0.0 - 0.1 K/uL  Lipase, blood     Status: None   Collection Time: 07/17/16  6:58 PM  Result Value Ref Range   Lipase 24 11 - 51 U/L    Ct Abdomen Pelvis W Contrast  Result Date: 07/17/2016 CLINICAL DATA:  Stool coming from vagina. History of diverticulitis with fistula. EXAM: CT ABDOMEN AND PELVIS WITH CONTRAST TECHNIQUE: Multidetector CT imaging of the abdomen and pelvis was performed using the standard protocol following bolus administration of intravenous contrast. CONTRAST:  100 ml ISOVUE-300 IOPAMIDOL (ISOVUE-300) INJECTION 61% COMPARISON:  02/21/2016 FINDINGS: Lower chest: Mild dependent changes in the lung bases. Small esophageal hiatal hernia. Hepatobiliary: No focal liver abnormality is seen. Status post  cholecystectomy. No biliary dilatation. Pancreas: Unremarkable. No pancreatic ductal dilatation or surrounding inflammatory changes. Spleen: Normal in size without focal abnormality. Adrenals/Urinary Tract: No adrenal gland nodules. Scarring in the lower pole left kidney. Sub cm cysts in the kidneys. No hydronephrosis or hydroureter. Bladder wall is not thickened and no filling defects are demonstrated. Stomach/Bowel: Stomach and small bowel are decompressed. Stool-filled colon with scattered diverticula. The sigmoid colon demonstrates diffuse wall thickening with multiple diverticula. There is progression since previous study. Changes suggest regional colitis or diverticulitis. There is stranding demonstrated throughout the fat in the pelvis around the sigmoid colon. No discrete abscess is identified. Sigmoid colon is contiguous with the left side of the vagina with stranding demonstrated between the sigmoid colon and the vagina. Gas within the area stranding consistent with fistula. Gas is present within the vagina. Appendix is normal. Vascular/Lymphatic: Aortic atherosclerosis. No enlarged abdominal or pelvic lymph nodes. Reproductive: Status post hysterectomy. No adnexal masses. Other: No abdominal wall hernia or abnormality. No abdominopelvic ascites. Musculoskeletal: Diffuse degenerative changes throughout the lumbar spine. Slight anterior subluxation at L4-5 probably is degenerative. IMPRESSION: Diffuse wall thickening of the sigmoid colon with diverticulosis. Changes may represent diverticulitis or regional colitis. Stranding in the pelvic fat without discrete abscess. Sigmoid colovaginal fistula is demonstrated with gas in the fistula tract and in the vagina. Electronically Signed   By: Lucienne Capers M.D.   On: 07/17/2016 21:28    Review of Systems  Constitutional: Negative for chills and fever.  HENT: Negative for hearing loss.   Eyes: Negative for blurred vision and double vision.  Respiratory:  Negative for cough and hemoptysis.   Cardiovascular: Negative for chest pain and palpitations.  Gastrointestinal: Negative for abdominal pain, nausea and vomiting.  Genitourinary: Negative for dysuria and urgency.  Musculoskeletal: Negative for myalgias and neck pain.  Skin: Positive for rash. Negative for itching.  Neurological: Negative for dizziness, tingling and headaches.  Endo/Heme/Allergies: Does not bruise/bleed easily.  Psychiatric/Behavioral: Negative for depression and suicidal ideas.   Blood pressure 156/71, pulse 79, temperature 98 F (36.7 C), temperature source Oral, resp. rate 18, SpO2 99 %. Physical Exam  Vitals reviewed. Constitutional: She is oriented to person, place, and time. She appears well-developed and well-nourished.  HENT:  Head: Normocephalic and atraumatic.  Eyes: Conjunctivae and EOM are normal. Pupils are equal, round, and reactive to light.  Neck: Normal range of motion. Neck supple.  Cardiovascular: Normal  rate and regular rhythm.   Respiratory: Effort normal and breath sounds normal.  GI: Soft. Bowel sounds are normal. She exhibits no distension. There is tenderness in the left lower quadrant.  Musculoskeletal: Normal range of motion.  Neurological: She is alert and oriented to person, place, and time.  Skin: Skin is warm and dry.  Psychiatric: She has a normal mood and affect. Her behavior is normal.    Assessment/Plan: 80 yo female with concern for acute diverticulitis and recurrent symptoms of colovaginal fistula -recommend antibiotics for acute diverticulitis without acute abscess or acute complication -follow up with Dr. Marcello Moores 3-4 weeks after discharge -long term patient would benefit from sigmoid resection with takedown of rectovaginal fistula. Recommend follow up with Leighton Ruff who initially had scheduled for such a procedure -She has discussed previously with Dr. Benson Norway her gastroenterologist who also agrees that long term she would  benefit from resection -we will continue to follow  Arta Bruce Micheale Schlack 07/17/2016, 10:13 PM

## 2016-07-18 ENCOUNTER — Observation Stay (HOSPITAL_COMMUNITY): Payer: Medicare Other

## 2016-07-18 DIAGNOSIS — K632 Fistula of intestine: Secondary | ICD-10-CM | POA: Diagnosis not present

## 2016-07-18 DIAGNOSIS — Z823 Family history of stroke: Secondary | ICD-10-CM | POA: Diagnosis not present

## 2016-07-18 DIAGNOSIS — D72829 Elevated white blood cell count, unspecified: Secondary | ICD-10-CM | POA: Diagnosis present

## 2016-07-18 DIAGNOSIS — K219 Gastro-esophageal reflux disease without esophagitis: Secondary | ICD-10-CM

## 2016-07-18 DIAGNOSIS — N823 Fistula of vagina to large intestine: Secondary | ICD-10-CM | POA: Diagnosis present

## 2016-07-18 DIAGNOSIS — Z803 Family history of malignant neoplasm of breast: Secondary | ICD-10-CM | POA: Diagnosis not present

## 2016-07-18 DIAGNOSIS — R1032 Left lower quadrant pain: Secondary | ICD-10-CM | POA: Diagnosis not present

## 2016-07-18 DIAGNOSIS — Z800 Family history of malignant neoplasm of digestive organs: Secondary | ICD-10-CM | POA: Diagnosis not present

## 2016-07-18 DIAGNOSIS — R0602 Shortness of breath: Secondary | ICD-10-CM | POA: Diagnosis not present

## 2016-07-18 DIAGNOSIS — N824 Other female intestinal-genital tract fistulae: Secondary | ICD-10-CM | POA: Diagnosis not present

## 2016-07-18 DIAGNOSIS — K5732 Diverticulitis of large intestine without perforation or abscess without bleeding: Secondary | ICD-10-CM | POA: Diagnosis not present

## 2016-07-18 DIAGNOSIS — R933 Abnormal findings on diagnostic imaging of other parts of digestive tract: Secondary | ICD-10-CM | POA: Diagnosis not present

## 2016-07-18 DIAGNOSIS — R109 Unspecified abdominal pain: Secondary | ICD-10-CM | POA: Diagnosis present

## 2016-07-18 DIAGNOSIS — K210 Gastro-esophageal reflux disease with esophagitis: Secondary | ICD-10-CM | POA: Diagnosis present

## 2016-07-18 DIAGNOSIS — I10 Essential (primary) hypertension: Secondary | ICD-10-CM | POA: Diagnosis present

## 2016-07-18 LAB — CBC
HCT: 35 % — ABNORMAL LOW (ref 36.0–46.0)
HEMOGLOBIN: 11.2 g/dL — AB (ref 12.0–15.0)
MCH: 26.1 pg (ref 26.0–34.0)
MCHC: 32 g/dL (ref 30.0–36.0)
MCV: 81.6 fL (ref 78.0–100.0)
Platelets: 260 10*3/uL (ref 150–400)
RBC: 4.29 MIL/uL (ref 3.87–5.11)
RDW: 15.7 % — ABNORMAL HIGH (ref 11.5–15.5)
WBC: 10.5 10*3/uL (ref 4.0–10.5)

## 2016-07-18 LAB — BASIC METABOLIC PANEL
ANION GAP: 8 (ref 5–15)
BUN: 14 mg/dL (ref 6–20)
CALCIUM: 9.4 mg/dL (ref 8.9–10.3)
CO2: 25 mmol/L (ref 22–32)
Chloride: 103 mmol/L (ref 101–111)
Creatinine, Ser: 0.84 mg/dL (ref 0.44–1.00)
Glucose, Bld: 109 mg/dL — ABNORMAL HIGH (ref 65–99)
Potassium: 4.4 mmol/L (ref 3.5–5.1)
Sodium: 136 mmol/L (ref 135–145)

## 2016-07-18 MED ORDER — RISAQUAD PO CAPS
1.0000 | ORAL_CAPSULE | Freq: Every day | ORAL | Status: DC
  Administered 2016-07-18 – 2016-07-20 (×3): 1 via ORAL
  Filled 2016-07-18 (×3): qty 1

## 2016-07-18 MED ORDER — ACETAMINOPHEN 325 MG PO TABS
650.0000 mg | ORAL_TABLET | Freq: Four times a day (QID) | ORAL | Status: DC | PRN
  Administered 2016-07-18 – 2016-07-19 (×2): 650 mg via ORAL
  Filled 2016-07-18 (×2): qty 2

## 2016-07-18 MED ORDER — SODIUM CHLORIDE 0.9 % IV SOLN
INTRAVENOUS | Status: DC
  Administered 2016-07-18 (×2): via INTRAVENOUS

## 2016-07-18 MED ORDER — ALBUTEROL SULFATE (2.5 MG/3ML) 0.083% IN NEBU: 2.5000 mg | INHALATION_SOLUTION | RESPIRATORY_TRACT | Status: DC | PRN

## 2016-07-18 MED ORDER — ALUM & MAG HYDROXIDE-SIMETH 200-200-20 MG/5ML PO SUSP
30.0000 mL | Freq: Four times a day (QID) | ORAL | Status: DC | PRN
  Administered 2016-07-18: 30 mL via ORAL
  Filled 2016-07-18: qty 30

## 2016-07-18 MED ORDER — ONDANSETRON HCL 4 MG PO TABS
4.0000 mg | ORAL_TABLET | Freq: Four times a day (QID) | ORAL | Status: DC | PRN
  Administered 2016-07-20: 4 mg via ORAL
  Filled 2016-07-18: qty 1

## 2016-07-18 MED ORDER — ONDANSETRON HCL 4 MG/2ML IJ SOLN
4.0000 mg | Freq: Four times a day (QID) | INTRAMUSCULAR | Status: DC | PRN
  Administered 2016-07-18 – 2016-07-19 (×3): 4 mg via INTRAVENOUS
  Filled 2016-07-18 (×3): qty 2

## 2016-07-18 MED ORDER — ACETAMINOPHEN 650 MG RE SUPP: 650.0000 mg | Freq: Four times a day (QID) | RECTAL | Status: DC | PRN

## 2016-07-18 MED ORDER — METRONIDAZOLE IN NACL 5-0.79 MG/ML-% IV SOLN
500.0000 mg | Freq: Three times a day (TID) | INTRAVENOUS | Status: DC
  Administered 2016-07-18 – 2016-07-20 (×7): 500 mg via INTRAVENOUS
  Filled 2016-07-18 (×10): qty 100

## 2016-07-18 MED ORDER — WHITE PETROLATUM GEL
Status: AC
  Administered 2016-07-18: 11:00:00
  Filled 2016-07-18: qty 1

## 2016-07-18 MED ORDER — PANTOPRAZOLE SODIUM 40 MG PO TBEC
40.0000 mg | DELAYED_RELEASE_TABLET | Freq: Two times a day (BID) | ORAL | Status: DC
  Administered 2016-07-18 – 2016-07-20 (×5): 40 mg via ORAL
  Filled 2016-07-18 (×5): qty 1

## 2016-07-18 MED ORDER — CIPROFLOXACIN IN D5W 400 MG/200ML IV SOLN
400.0000 mg | Freq: Two times a day (BID) | INTRAVENOUS | Status: DC
  Administered 2016-07-18 – 2016-07-19 (×4): 400 mg via INTRAVENOUS
  Filled 2016-07-18 (×6): qty 200

## 2016-07-18 MED ORDER — HYDROMORPHONE HCL 1 MG/ML IJ SOLN: 0.5000 mg | INTRAMUSCULAR | Status: DC | PRN

## 2016-07-18 MED ORDER — ENOXAPARIN SODIUM 40 MG/0.4ML ~~LOC~~ SOLN
40.0000 mg | SUBCUTANEOUS | Status: DC
  Administered 2016-07-18 – 2016-07-19 (×2): 40 mg via SUBCUTANEOUS
  Filled 2016-07-18 (×2): qty 0.4

## 2016-07-18 NOTE — Progress Notes (Signed)
Subjective: She feels better this Am, had a stool this AM without stool from her vagina.  Last time that happened was about 11 Pm last evening.    Objective: Vital signs in last 24 hours: Temp:  [97.4 F (36.3 C)-98 F (36.7 C)] 98 F (36.7 C) (11/08 0526) Pulse Rate:  [72-95] 72 (11/08 0526) Resp:  [16-19] 17 (11/08 0526) BP: (137-167)/(55-96) 141/55 (11/08 0526) SpO2:  [96 %-100 %] 98 % (11/08 0526) Weight:  [91.6 kg (202 lb)] 91.6 kg (202 lb) (11/08 0039) Last BM Date: 07/18/16 NPO Voided x 1  BM x 1 WBC is better, BMP Ok CXR stable, ongoing cardiomegaly  Intake/Output from previous day: 11/07 0701 - 11/08 0700 In: 312.5 [I.V.:312.5] Out: -  Intake/Output this shift: No intake/output data recorded.  General appearance: alert, cooperative and no distress GI: soft, still a little tender Lower abdomen.  No distension and she say discomfort is much better.  Lab Results:   Recent Labs  07/17/16 1858 07/18/16 0635  WBC 13.1* 10.5  HGB 12.2 11.2*  HCT 36.7 35.0*  PLT 269 260    BMET  Recent Labs  07/17/16 1858 07/18/16 0635  NA 136 136  K 4.0 4.4  CL 102 103  CO2 23 25  GLUCOSE 100* 109*  BUN 18 14  CREATININE 1.00 0.84  CALCIUM 10.2 9.4   PT/INR No results for input(s): LABPROT, INR in the last 72 hours.   Recent Labs Lab 07/17/16 1858  AST 20  ALT 14  ALKPHOS 73  BILITOT 0.8  PROT 6.8  ALBUMIN 3.7     Lipase     Component Value Date/Time   LIPASE 24 07/17/2016 1858     Studies/Results: Ct Abdomen Pelvis W Contrast  Result Date: 07/17/2016 CLINICAL DATA:  Stool coming from vagina. History of diverticulitis with fistula. EXAM: CT ABDOMEN AND PELVIS WITH CONTRAST TECHNIQUE: Multidetector CT imaging of the abdomen and pelvis was performed using the standard protocol following bolus administration of intravenous contrast. CONTRAST:  100 ml ISOVUE-300 IOPAMIDOL (ISOVUE-300) INJECTION 61% COMPARISON:  02/21/2016 FINDINGS: Lower chest: Mild  dependent changes in the lung bases. Small esophageal hiatal hernia. Hepatobiliary: No focal liver abnormality is seen. Status post cholecystectomy. No biliary dilatation. Pancreas: Unremarkable. No pancreatic ductal dilatation or surrounding inflammatory changes. Spleen: Normal in size without focal abnormality. Adrenals/Urinary Tract: No adrenal gland nodules. Scarring in the lower pole left kidney. Sub cm cysts in the kidneys. No hydronephrosis or hydroureter. Bladder wall is not thickened and no filling defects are demonstrated. Stomach/Bowel: Stomach and small bowel are decompressed. Stool-filled colon with scattered diverticula. The sigmoid colon demonstrates diffuse wall thickening with multiple diverticula. There is progression since previous study. Changes suggest regional colitis or diverticulitis. There is stranding demonstrated throughout the fat in the pelvis around the sigmoid colon. No discrete abscess is identified. Sigmoid colon is contiguous with the left side of the vagina with stranding demonstrated between the sigmoid colon and the vagina. Gas within the area stranding consistent with fistula. Gas is present within the vagina. Appendix is normal. Vascular/Lymphatic: Aortic atherosclerosis. No enlarged abdominal or pelvic lymph nodes. Reproductive: Status post hysterectomy. No adnexal masses. Other: No abdominal wall hernia or abnormality. No abdominopelvic ascites. Musculoskeletal: Diffuse degenerative changes throughout the lumbar spine. Slight anterior subluxation at L4-5 probably is degenerative. IMPRESSION: Diffuse wall thickening of the sigmoid colon with diverticulosis. Changes may represent diverticulitis or regional colitis. Stranding in the pelvic fat without discrete abscess. Sigmoid colovaginal fistula is demonstrated with  gas in the fistula tract and in the vagina. Electronically Signed   By: Lucienne Capers M.D.   On: 07/17/2016 21:28   Dg Chest Port 1 View  Result Date:  07/18/2016 CLINICAL DATA:  Shortness of breath EXAM: PORTABLE CHEST 1 VIEW COMPARISON:  Chest radiograph 08/26/2014 FINDINGS: The lungs are well inflated. Cardiomegaly is unchanged. No focal airspace consolidation or pulmonary edema. No pneumothorax or sizable pleural effusion. IMPRESSION: Unchanged cardiomegaly.  Lungs clear. Electronically Signed   By: Ulyses Jarred M.D.   On: 07/18/2016 01:41    Medications: . acidophilus  1 capsule Oral Daily  . ciprofloxacin  400 mg Intravenous Q12H  . enoxaparin (LOVENOX) injection  40 mg Subcutaneous Q24H  .  HYDROmorphone (DILAUDID) injection  1 mg Intravenous Once  . metronidazole  500 mg Intravenous Q8H  . pantoprazole  40 mg Oral BID   . sodium chloride 75 mL/hr at 07/18/16 0117    Assessment/Plan Recurring abdominal pain and draining colovaginal fistula Colovaginal fistula with  Hx of diverticulosis Surgery previously scheduled and deferred with resolution of sx; Dr. Leighton Ruff FEN: NPO ID: Cipro/Flagyl day 2 DVT:  Lovenox   Plan:  Sips and chips, mobilize and continue antibiotics.  She will need to get over this and follow up with Dr. Marcello Moores after discharge.     LOS: 0 days    Tavaras Goody 07/18/2016 [phone]

## 2016-07-18 NOTE — Consult Note (Signed)
Reason for Consult: Colovaginal fistula and diverticular disease Referring Physician: [facility]  Ellen Johnson HPI: This is a 81 year old female who is well-known to me admitted for her colovaginal fistula.  She called the office yesterday late afternoon very worried about her symptoms and she was offered a work-in today, but she was experiencing chills.  As a result she was told to proceed to the ER.  Earlier in the day she felt a sensation of an inability to pass stool, but then her symptoms localized to the LLQ.  Subsequently she started to pass stool through her vagina.  A CT scan revealed a definite colovaginal fistula compared to her prior CT scan in 02/2016.  During that scan a definite fistula was not noted, but she was treated with antibiotics and she was referred to Dr. Marcello Moores.  Surgical intervention recommended, but she declined as she was feeling better, however, she started to have issues a couple of weeks ago.  Currently she feels better with antibiotic treatment and her WBC has declined.  Her last colonoscopy was 2 years ago and it was consistent with a pandiverticulosis and a polyp.  Past Medical History:  Diagnosis Date  . Diverticulosis   . GERD (gastroesophageal reflux disease)   . HTN (hypertension)     Past Surgical History:  Procedure Laterality Date  . ABDOMINAL HYSTERECTOMY     TAH BSO  . FRACTURE SURGERY    . gall bladder removed      Family History  Problem Relation Age of Onset  . Stroke Mother   . Cancer Father     Colon  . Stroke Maternal Grandmother   . Breast cancer Paternal Grandmother     Social History:  reports that she has never smoked. She has never used smokeless tobacco. She reports that she drinks about 1.2 oz of alcohol per week . Her drug history is not on file.  Allergies:  Allergies  Allergen Reactions  . Valium [Diazepam] Other (See Comments)    Totally different personality    Medications:  Scheduled: . acidophilus  1  capsule Oral Daily  . ciprofloxacin  400 mg Intravenous Q12H  . enoxaparin (LOVENOX) injection  40 mg Subcutaneous Q24H  .  HYDROmorphone (DILAUDID) injection  1 mg Intravenous Once  . metronidazole  500 mg Intravenous Q8H  . pantoprazole  40 mg Oral BID   Continuous: . sodium chloride 75 mL/hr at 07/18/16 1748    Results for orders placed or performed during the hospital encounter of 07/17/16 (from the past 24 hour(s))  Comprehensive metabolic panel     Status: Abnormal   Collection Time: 07/17/16  6:58 PM  Result Value Ref Range   Sodium 136 135 - 145 mmol/L   Potassium 4.0 3.5 - 5.1 mmol/L   Chloride 102 101 - 111 mmol/L   CO2 23 22 - 32 mmol/L   Glucose, Bld 100 (H) 65 - 99 mg/dL   BUN 18 6 - 20 mg/dL   Creatinine, Ser 1.00 0.44 - 1.00 mg/dL   Calcium 10.2 8.9 - 10.3 mg/dL   Total Protein 6.8 6.5 - 8.1 g/dL   Albumin 3.7 3.5 - 5.0 g/dL   AST 20 15 - 41 U/L   ALT 14 14 - 54 U/L   Alkaline Phosphatase 73 38 - 126 U/L   Total Bilirubin 0.8 0.3 - 1.2 mg/dL   GFR calc non Af Amer 53 (L) >60 mL/min   GFR calc Af Amer >60 >60  mL/min   Anion gap 11 5 - 15  CBC with Differential     Status: Abnormal   Collection Time: 07/17/16  6:58 PM  Result Value Ref Range   WBC 13.1 (H) 4.0 - 10.5 K/uL   RBC 4.57 3.87 - 5.11 MIL/uL   Hemoglobin 12.2 12.0 - 15.0 g/dL   HCT 36.7 36.0 - 46.0 %   MCV 80.3 78.0 - 100.0 fL   MCH 26.7 26.0 - 34.0 pg   MCHC 33.2 30.0 - 36.0 g/dL   RDW 15.4 11.5 - 15.5 %   Platelets 269 150 - 400 K/uL   Neutrophils Relative % 81 %   Neutro Abs 10.5 (H) 1.7 - 7.7 K/uL   Lymphocytes Relative 13 %   Lymphs Abs 1.7 0.7 - 4.0 K/uL   Monocytes Relative 5 %   Monocytes Absolute 0.7 0.1 - 1.0 K/uL   Eosinophils Relative 1 %   Eosinophils Absolute 0.2 0.0 - 0.7 K/uL   Basophils Relative 0 %   Basophils Absolute 0.0 0.0 - 0.1 K/uL  Lipase, blood     Status: None   Collection Time: 07/17/16  6:58 PM  Result Value Ref Range   Lipase 24 11 - 51 U/L  Basic metabolic  panel     Status: Abnormal   Collection Time: 07/18/16  6:35 AM  Result Value Ref Range   Sodium 136 135 - 145 mmol/L   Potassium 4.4 3.5 - 5.1 mmol/L   Chloride 103 101 - 111 mmol/L   CO2 25 22 - 32 mmol/L   Glucose, Bld 109 (H) 65 - 99 mg/dL   BUN 14 6 - 20 mg/dL   Creatinine, Ser 0.84 0.44 - 1.00 mg/dL   Calcium 9.4 8.9 - 10.3 mg/dL   GFR calc non Af Amer >60 >60 mL/min   GFR calc Af Amer >60 >60 mL/min   Anion gap 8 5 - 15  CBC     Status: Abnormal   Collection Time: 07/18/16  6:35 AM  Result Value Ref Range   WBC 10.5 4.0 - 10.5 K/uL   RBC 4.29 3.87 - 5.11 MIL/uL   Hemoglobin 11.2 (L) 12.0 - 15.0 g/dL   HCT 35.0 (L) 36.0 - 46.0 %   MCV 81.6 78.0 - 100.0 fL   MCH 26.1 26.0 - 34.0 pg   MCHC 32.0 30.0 - 36.0 g/dL   RDW 15.7 (H) 11.5 - 15.5 %   Platelets 260 150 - 400 K/uL     Ct Abdomen Pelvis W Contrast  Result Date: 07/17/2016 CLINICAL DATA:  Stool coming from vagina. History of diverticulitis with fistula. EXAM: CT ABDOMEN AND PELVIS WITH CONTRAST TECHNIQUE: Multidetector CT imaging of the abdomen and pelvis was performed using the standard protocol following bolus administration of intravenous contrast. CONTRAST:  100 ml ISOVUE-300 IOPAMIDOL (ISOVUE-300) INJECTION 61% COMPARISON:  02/21/2016 FINDINGS: Lower chest: Mild dependent changes in the lung bases. Small esophageal hiatal hernia. Hepatobiliary: No focal liver abnormality is seen. Status post cholecystectomy. No biliary dilatation. Pancreas: Unremarkable. No pancreatic ductal dilatation or surrounding inflammatory changes. Spleen: Normal in size without focal abnormality. Adrenals/Urinary Tract: No adrenal gland nodules. Scarring in the lower pole left kidney. Sub cm cysts in the kidneys. No hydronephrosis or hydroureter. Bladder wall is not thickened and no filling defects are demonstrated. Stomach/Bowel: Stomach and small bowel are decompressed. Stool-filled colon with scattered diverticula. The sigmoid colon demonstrates  diffuse wall thickening with multiple diverticula. There is progression since previous study. Changes suggest  regional colitis or diverticulitis. There is stranding demonstrated throughout the fat in the pelvis around the sigmoid colon. No discrete abscess is identified. Sigmoid colon is contiguous with the left side of the vagina with stranding demonstrated between the sigmoid colon and the vagina. Gas within the area stranding consistent with fistula. Gas is present within the vagina. Appendix is normal. Vascular/Lymphatic: Aortic atherosclerosis. No enlarged abdominal or pelvic lymph nodes. Reproductive: Status post hysterectomy. No adnexal masses. Other: No abdominal wall hernia or abnormality. No abdominopelvic ascites. Musculoskeletal: Diffuse degenerative changes throughout the lumbar spine. Slight anterior subluxation at L4-5 probably is degenerative. IMPRESSION: Diffuse wall thickening of the sigmoid colon with diverticulosis. Changes may represent diverticulitis or regional colitis. Stranding in the pelvic fat without discrete abscess. Sigmoid colovaginal fistula is demonstrated with gas in the fistula tract and in the vagina. Electronically Signed   By: Lucienne Ellen M.Johnson.   On: 07/17/2016 21:28   Dg Chest Port 1 View  Result Date: 07/18/2016 CLINICAL DATA:  Shortness of breath EXAM: PORTABLE CHEST 1 VIEW COMPARISON:  Chest radiograph 08/26/2014 FINDINGS: The lungs are well inflated. Cardiomegaly is unchanged. No focal airspace consolidation or pulmonary edema. No pneumothorax or sizable pleural effusion. IMPRESSION: Unchanged cardiomegaly.  Lungs clear. Electronically Signed   By: Ulyses Jarred M.Johnson.   On: 07/18/2016 01:41    ROS:  As stated above in the HPI otherwise negative.  Blood pressure (!) 120/56, pulse 77, temperature 99 F (37.2 C), temperature source Oral, resp. rate 17, height 5\' 5"  (1.651 m), weight 91.6 kg (202 lb), SpO2 98 %.    PE: Gen: NAD, Alert and Oriented HEENT:   [place]/AT, EOMI Neck: Supple, no LAD Lungs: CTA Bilaterally CV: RRR without M/G/R ABM: Soft, tender in the LLQ, +BS Ext: No C/C/E  Assessment/Plan: 1) Colovaginal fistula. 2) Diverticular disease/diverticulitis. 3) FH of colon cancer.   She was well, but I did caution her in the office that this problem was not going to resolve and surgery was required.  Plan: 1) Continue with antibiotics. 2) Surgical intervention per Dr. Marcello Moores.  Ellen Johnson 07/18/2016, 6:33 PM

## 2016-07-18 NOTE — Progress Notes (Signed)
[facility]                                                                              Patient Demographics  Ellen Johnson, is a 80 y.o. female, DOB - [birth year], [id number]  Admit date - 07/17/2016   Admitting Physician Norval Morton, MD  Outpatient Primary MD for the patient is Pcp Not In System  Outpatient specialists:   LOS - 0  days    Chief Complaint  Patient presents with  . Abdominal Pain       Brief summary   Patient is a 80 year old female with a history of diverticulitis and colovaginal fistula also followed by Dr. Benson Norway was presented with worsening of abdominal pain and stool coming out of the vagina. Patient was initially scheduled for her surgery in the May but had resolution of her symptoms with antibiotics and decided to delay her surgery. The patient reported that she was having lower abdominal cramping with acute onset of stool from her vagina and associated symptoms of fevers, chills, dizziness and hence reported to the ER. Lab work revealed WBC of 13.1, BUN 18, creatinine 1, and all other vitals relatively within normal limits. CT scan of the abdomen showed diverticulitis with a colovaginal fistula and general surgery was consulted    Assessment & Plan    Principal Problem: Diverticulitis/ Colovaginal fistula: Acute on chronic.  -  CT scan shows acute diverticulitis - Continue empiric IV ciprofloxacin and metronidazole  - Appreciate surgery 's recommendations, recommended outpatient follow-up with Dr. Leighton Ruff and schedule the surgery -Sips and chips, mobilize. - GI, Dr. Benson Norway notified   Leukocytosis: WBC elevated at 13.1.  likely secondary to above - improving  GERD - Continue pharmacy substitution of Protonix  Code Status: full code  DVT Prophylaxis:  Lovenox  Family Communication: Discussed in detail with the patient, all imaging results, lab results explained to the patient  Disposition Plan:   Time Spent in  minutes  25 minutes  Procedures:  CT abd  Consultants:   Surgery GI  Antimicrobials:   IV Cipro  IV Flagyl   Medications  Scheduled Meds: . acidophilus  1 capsule Oral Daily  . ciprofloxacin  400 mg Intravenous Q12H  . enoxaparin (LOVENOX) injection  40 mg Subcutaneous Q24H  .  HYDROmorphone (DILAUDID) injection  1 mg Intravenous Once  . metronidazole  500 mg Intravenous Q8H  . pantoprazole  40 mg Oral BID   Continuous Infusions: . sodium chloride 75 mL/hr at 07/18/16 0117   PRN Meds:.acetaminophen **OR** acetaminophen, albuterol, alum & mag hydroxide-simeth, HYDROmorphone (DILAUDID) injection, ondansetron **OR** ondansetron (ZOFRAN) IV   Antibiotics   Anti-infectives    Start     Dose/Rate Route Frequency Ordered Stop   07/18/16 1000  ciprofloxacin (CIPRO) IVPB 400 mg     400 mg 200 mL/hr over 60 Minutes Intravenous Every 12 hours 07/18/16 0108     07/18/16 0600  metroNIDAZOLE (FLAGYL) IVPB 500 mg     500 mg 100 mL/hr over 60 Minutes Intravenous Every 8 hours 07/18/16 0108     07/17/16 1930  ciprofloxacin (CIPRO) IVPB 400 mg  400 mg 200 mL/hr over 60 Minutes Intravenous  Once 07/17/16 1917 07/17/16 2259   07/17/16 1930  metroNIDAZOLE (FLAGYL) IVPB 500 mg     500 mg 100 mL/hr over 60 Minutes Intravenous  Once 07/17/16 1917 07/17/16 2321        Subjective:   Ellen Johnson was seen and examined today.  Patient denies dizziness, chest pain, shortness of breath, N/V new weakness, numbess, tingling. No acute events overnight.  Abdominal pain is improving per patient.  Objective:   Vitals:   07/17/16 2330 07/17/16 2344 07/18/16 0039 07/18/16 0526  BP: 153/96 153/96  (!) 141/55  Pulse: 81 75  72  Resp:  19  17  Temp:    98 F (36.7 C)  TempSrc:    Oral  SpO2: 97% 100%  98%  Weight:   91.6 kg (202 lb)   Height:   5\' 5"  (1.651 m)     Intake/Output Summary (Last 24 hours) at 07/18/16 1233 Last data filed at 07/18/16 0527  Gross per 24 hour  Intake             312.5 ml  Output                0 ml  Net            312.5 ml     Wt Readings from Last 3 Encounters:  07/18/16 91.6 kg (202 lb)  07/06/16 92.4 kg (203 lb 9.6 oz)  06/18/16 92.6 kg (204 lb 4 oz)     Exam  General: Alert and oriented x 3, NAD  HEENT:  PERRLA, EOMI, Anicteric Sclera, mucous membranes moist.   Neck: Supple, no JVD, no masses  Cardiovascular: S1 S2 auscultated, no rubs, murmurs or gallops. Regular rate and rhythm.  Respiratory: Clear to auscultation bilaterally, no wheezing, rales or rhonchi  Gastrointestinal: Soft, Still slightly tender in the lower abdomen but improving, nondistended, + bowel sounds  Ext: no cyanosis clubbing or edema  Neuro: AAOx3, Cr N's II- XII. Strength 5/5 upper and lower extremities bilaterally  Skin: No rashes  Psych: Normal affect and demeanor, alert and oriented x3    Data Reviewed:  I have personally reviewed following labs and imaging studies  Micro Results No results found for this or any previous visit (from the past 240 hour(s)).  Radiology Reports Ct Abdomen Pelvis W Contrast  Result Date: 07/17/2016 CLINICAL DATA:  Stool coming from vagina. History of diverticulitis with fistula. EXAM: CT ABDOMEN AND PELVIS WITH CONTRAST TECHNIQUE: Multidetector CT imaging of the abdomen and pelvis was performed using the standard protocol following bolus administration of intravenous contrast. CONTRAST:  100 ml ISOVUE-300 IOPAMIDOL (ISOVUE-300) INJECTION 61% COMPARISON:  02/21/2016 FINDINGS: Lower chest: Mild dependent changes in the lung bases. Small esophageal hiatal hernia. Hepatobiliary: No focal liver abnormality is seen. Status post cholecystectomy. No biliary dilatation. Pancreas: Unremarkable. No pancreatic ductal dilatation or surrounding inflammatory changes. Spleen: Normal in size without focal abnormality. Adrenals/Urinary Tract: No adrenal gland nodules. Scarring in the lower pole left kidney. Sub cm cysts in the  kidneys. No hydronephrosis or hydroureter. Bladder wall is not thickened and no filling defects are demonstrated. Stomach/Bowel: Stomach and small bowel are decompressed. Stool-filled colon with scattered diverticula. The sigmoid colon demonstrates diffuse wall thickening with multiple diverticula. There is progression since previous study. Changes suggest regional colitis or diverticulitis. There is stranding demonstrated throughout the fat in the pelvis around the sigmoid colon. No discrete abscess is identified. Sigmoid colon is contiguous  with the left side of the vagina with stranding demonstrated between the sigmoid colon and the vagina. Gas within the area stranding consistent with fistula. Gas is present within the vagina. Appendix is normal. Vascular/Lymphatic: Aortic atherosclerosis. No enlarged abdominal or pelvic lymph nodes. Reproductive: Status post hysterectomy. No adnexal masses. Other: No abdominal wall hernia or abnormality. No abdominopelvic ascites. Musculoskeletal: Diffuse degenerative changes throughout the lumbar spine. Slight anterior subluxation at L4-5 probably is degenerative. IMPRESSION: Diffuse wall thickening of the sigmoid colon with diverticulosis. Changes may represent diverticulitis or regional colitis. Stranding in the pelvic fat without discrete abscess. Sigmoid colovaginal fistula is demonstrated with gas in the fistula tract and in the vagina. Electronically Signed   By: Lucienne Capers M.D.   On: 07/17/2016 21:28   Dg Chest Port 1 View  Result Date: 07/18/2016 CLINICAL DATA:  Shortness of breath EXAM: PORTABLE CHEST 1 VIEW COMPARISON:  Chest radiograph 08/26/2014 FINDINGS: The lungs are well inflated. Cardiomegaly is unchanged. No focal airspace consolidation or pulmonary edema. No pneumothorax or sizable pleural effusion. IMPRESSION: Unchanged cardiomegaly.  Lungs clear. Electronically Signed   By: Ulyses Jarred M.D.   On: 07/18/2016 01:41    Lab  Data:  CBC:  Recent Labs Lab 07/17/16 1858 07/18/16 0635  WBC 13.1* 10.5  NEUTROABS 10.5*  --   HGB 12.2 11.2*  HCT 36.7 35.0*  MCV 80.3 81.6  PLT 269 123456   Basic Metabolic Panel:  Recent Labs Lab 07/17/16 1858 07/18/16 0635  NA 136 136  K 4.0 4.4  CL 102 103  CO2 23 25  GLUCOSE 100* 109*  BUN 18 14  CREATININE 1.00 0.84  CALCIUM 10.2 9.4   GFR: Estimated Creatinine Clearance: 62.7 mL/min (by C-G formula based on SCr of 0.84 mg/dL). Liver Function Tests:  Recent Labs Lab 07/17/16 1858  AST 20  ALT 14  ALKPHOS 73  BILITOT 0.8  PROT 6.8  ALBUMIN 3.7    Recent Labs Lab 07/17/16 1858  LIPASE 24   No results for input(s): AMMONIA in the last 168 hours. Coagulation Profile: No results for input(s): INR, PROTIME in the last 168 hours. Cardiac Enzymes: No results for input(s): CKTOTAL, CKMB, CKMBINDEX, TROPONINI in the last 168 hours. BNP (last 3 results) No results for input(s): PROBNP in the last 8760 hours. HbA1C: No results for input(s): HGBA1C in the last 72 hours. CBG: No results for input(s): GLUCAP in the last 168 hours. Lipid Profile: No results for input(s): CHOL, HDL, LDLCALC, TRIG, CHOLHDL, LDLDIRECT in the last 72 hours. Thyroid Function Tests: No results for input(s): TSH, T4TOTAL, FREET4, T3FREE, THYROIDAB in the last 72 hours. Anemia Panel: No results for input(s): VITAMINB12, FOLATE, FERRITIN, TIBC, IRON, RETICCTPCT in the last 72 hours. Urine analysis:    Component Value Date/Time   BILIRUBINUR small (A) 12/27/2015 1848   BILIRUBINUR neg 01/25/2015 1833   KETONESUR trace (5) (A) 12/27/2015 1848   PROTEINUR =100 (A) 12/27/2015 1848   PROTEINUR neg 01/25/2015 1833   UROBILINOGEN 4.0 12/27/2015 1848   NITRITE Negative 12/27/2015 1848   NITRITE neg 01/25/2015 1833   LEUKOCYTESUR moderate (2+) (A) 12/27/2015 1848     Eriyana Sweeten M.D. [facility] 07/18/2016, 12:33 PM  Pager: [phone] Between 7am to 7pm - call Pager -  [phone]  After 7pm go to [email or web address] - password [email or web address]  Call night coverage person covering after 7pm

## 2016-07-19 LAB — URINALYSIS, ROUTINE W REFLEX MICROSCOPIC
Bilirubin Urine: NEGATIVE
GLUCOSE, UA: NEGATIVE mg/dL
Hgb urine dipstick: NEGATIVE
KETONES UR: NEGATIVE mg/dL
LEUKOCYTES UA: NEGATIVE
NITRITE: NEGATIVE
PROTEIN: NEGATIVE mg/dL
Specific Gravity, Urine: 1.006 (ref 1.005–1.030)
pH: 6 (ref 5.0–8.0)

## 2016-07-19 NOTE — Progress Notes (Signed)
Subjective: Angry about food and no bath. No further symptoms at this point.  No abdominal pain and no stool in her vagina.    Objective: Vital signs in last 24 hours: Temp:  [97.8 F (36.6 C)-99 F (37.2 C)] 97.8 F (36.6 C) (11/09 0438) Pulse Rate:  [72-79] 79 (11/09 0438) Resp:  [17-18] 17 (11/09 0438) BP: (120-139)/(56-61) 139/61 (11/09 0438) SpO2:  [94 %-98 %] 96 % (11/09 0438) Last BM Date: 07/18/16 PO 50 2000 IV 450 urine recorded Afebrile, VSS NO labs  Intake/Output from previous day: 11/08 0701 - 11/09 0700 In: 2048.8 [P.O.:50; I.V.:1698.8; IV Piggyback:300] Out: 450 [Urine:450] Intake/Output this shift: No intake/output data recorded.  General appearance: alert, cooperative and no distress GI: soft, non-tender; bowel sounds normal; no masses,  no organomegaly  Lab Results:   Recent Labs  07/17/16 1858 07/18/16 0635  WBC 13.1* 10.5  HGB 12.2 11.2*  HCT 36.7 35.0*  PLT 269 260    BMET  Recent Labs  07/17/16 1858 07/18/16 0635  NA 136 136  K 4.0 4.4  CL 102 103  CO2 23 25  GLUCOSE 100* 109*  BUN 18 14  CREATININE 1.00 0.84  CALCIUM 10.2 9.4   PT/INR No results for input(s): LABPROT, INR in the last 72 hours.   Recent Labs Lab 07/17/16 1858  AST 20  ALT 14  ALKPHOS 73  BILITOT 0.8  PROT 6.8  ALBUMIN 3.7     Lipase     Component Value Date/Time   LIPASE 24 07/17/2016 1858     Studies/Results: Ct Abdomen Pelvis W Contrast  Result Date: 07/17/2016 CLINICAL DATA:  Stool coming from vagina. History of diverticulitis with fistula. EXAM: CT ABDOMEN AND PELVIS WITH CONTRAST TECHNIQUE: Multidetector CT imaging of the abdomen and pelvis was performed using the standard protocol following bolus administration of intravenous contrast. CONTRAST:  100 ml ISOVUE-300 IOPAMIDOL (ISOVUE-300) INJECTION 61% COMPARISON:  02/21/2016 FINDINGS: Lower chest: Mild dependent changes in the lung bases. Small esophageal hiatal hernia. Hepatobiliary: No  focal liver abnormality is seen. Status post cholecystectomy. No biliary dilatation. Pancreas: Unremarkable. No pancreatic ductal dilatation or surrounding inflammatory changes. Spleen: Normal in size without focal abnormality. Adrenals/Urinary Tract: No adrenal gland nodules. Scarring in the lower pole left kidney. Sub cm cysts in the kidneys. No hydronephrosis or hydroureter. Bladder wall is not thickened and no filling defects are demonstrated. Stomach/Bowel: Stomach and small bowel are decompressed. Stool-filled colon with scattered diverticula. The sigmoid colon demonstrates diffuse wall thickening with multiple diverticula. There is progression since previous study. Changes suggest regional colitis or diverticulitis. There is stranding demonstrated throughout the fat in the pelvis around the sigmoid colon. No discrete abscess is identified. Sigmoid colon is contiguous with the left side of the vagina with stranding demonstrated between the sigmoid colon and the vagina. Gas within the area stranding consistent with fistula. Gas is present within the vagina. Appendix is normal. Vascular/Lymphatic: Aortic atherosclerosis. No enlarged abdominal or pelvic lymph nodes. Reproductive: Status post hysterectomy. No adnexal masses. Other: No abdominal wall hernia or abnormality. No abdominopelvic ascites. Musculoskeletal: Diffuse degenerative changes throughout the lumbar spine. Slight anterior subluxation at L4-5 probably is degenerative. IMPRESSION: Diffuse wall thickening of the sigmoid colon with diverticulosis. Changes may represent diverticulitis or regional colitis. Stranding in the pelvic fat without discrete abscess. Sigmoid colovaginal fistula is demonstrated with gas in the fistula tract and in the vagina. Electronically Signed   By: Lucienne Capers M.D.   On: 07/17/2016 21:28   Dg  Chest Port 1 View  Result Date: 07/18/2016 CLINICAL DATA:  Shortness of breath EXAM: PORTABLE CHEST 1 VIEW COMPARISON:  Chest  radiograph 08/26/2014 FINDINGS: The lungs are well inflated. Cardiomegaly is unchanged. No focal airspace consolidation or pulmonary edema. No pneumothorax or sizable pleural effusion. IMPRESSION: Unchanged cardiomegaly.  Lungs clear. Electronically Signed   By: Ulyses Jarred M.D.   On: 07/18/2016 01:41    Medications: . acidophilus  1 capsule Oral Daily  . ciprofloxacin  400 mg Intravenous Q12H  . enoxaparin (LOVENOX) injection  40 mg Subcutaneous Q24H  .  HYDROmorphone (DILAUDID) injection  1 mg Intravenous Once  . metronidazole  500 mg Intravenous Q8H  . pantoprazole  40 mg Oral BID    Assessment/Plan Recurring abdominal pain and draining colovaginal fistula Colovaginal fistula with  Hx of diverticulosis Surgery previously scheduled and deferred with resolution of sx; Dr. Leighton Ruff FEN: Clears sips and ice chips  to full liquids today ID: Cipro/Flagyl day 3 DVT:  Lovenox   Plan: Continue antibiotic treatment, advance diet. Home when stable on antibiotics. Follow-up with Dr. Leighton Ruff.    LOS: 1 day    Ellen Johnson 07/19/2016 [phone]

## 2016-07-19 NOTE — Progress Notes (Signed)
[facility]                                                                              Patient Demographics  Ellen Johnson, is a 80 y.o. female, DOB - [birth year], [id number]  Admit date - 07/17/2016   Admitting Physician Norval Morton, MD  Outpatient Primary MD for the patient is Pcp Not In System  Outpatient specialists:   LOS - 1  days    Chief Complaint  Patient presents with  . Abdominal Pain       Brief summary   Patient is a 80 year old female with a history of diverticulitis and colovaginal fistula also followed by Dr. Benson Norway was presented with worsening of abdominal pain and stool coming out of the vagina. Patient was initially scheduled for her surgery in the May but had resolution of her symptoms with antibiotics and decided to delay her surgery. The patient reported that she was having lower abdominal cramping with acute onset of stool from her vagina and associated symptoms of fevers, chills, dizziness and hence reported to the ER. Lab work revealed WBC of 13.1, BUN 18, creatinine 1, and all other vitals relatively within normal limits. CT scan of the abdomen showed diverticulitis with a colovaginal fistula and general surgery was consulted    Assessment & Plan    Principal Problem: Diverticulitis/ Colovaginal fistula: Acute on chronic.  -  CT scan showed acute diverticulitis With colovaginal fistula - Continue empiric IV ciprofloxacin and metronidazole  - Appreciate surgery 's recommendations, recommended outpatient follow-up with Dr. Leighton Ruff and schedule the surgery - General surgery and GI following. Diet advanced to full liquids today and soft solids in a.m. as tolerated - Hopefully DC home in a.m. if continues to improve  Leukocytosis: WBC elevated at 13.1.  likely secondary to above - improving  GERD - Continue pharmacy substitution of Protonix  Code Status: full code  DVT Prophylaxis:  Lovenox  Family Communication:  Discussed in detail with the patient, all imaging results, lab results explained to the patient  Disposition Plan:   Time Spent in minutes 15 minutes  Procedures:  CT abd  Consultants:   Surgery GI  Antimicrobials:   IV Cipro  IV Flagyl   Medications  Scheduled Meds: . acidophilus  1 capsule Oral Daily  . ciprofloxacin  400 mg Intravenous Q12H  . enoxaparin (LOVENOX) injection  40 mg Subcutaneous Q24H  .  HYDROmorphone (DILAUDID) injection  1 mg Intravenous Once  . metronidazole  500 mg Intravenous Q8H  . pantoprazole  40 mg Oral BID   Continuous Infusions: . sodium chloride 75 mL/hr at 07/18/16 1748   PRN Meds:.acetaminophen **OR** acetaminophen, albuterol, alum & mag hydroxide-simeth, HYDROmorphone (DILAUDID) injection, ondansetron **OR** ondansetron (ZOFRAN) IV   Antibiotics   Anti-infectives    Start     Dose/Rate Route Frequency Ordered Stop   07/18/16 1000  ciprofloxacin (CIPRO) IVPB 400 mg     400 mg 200 mL/hr over 60 Minutes Intravenous Every 12 hours 07/18/16 0108     07/18/16 0600  metroNIDAZOLE (FLAGYL) IVPB 500 mg     500 mg 100 mL/hr over 60  Minutes Intravenous Every 8 hours 07/18/16 0108     07/17/16 1930  ciprofloxacin (CIPRO) IVPB 400 mg     400 mg 200 mL/hr over 60 Minutes Intravenous  Once 07/17/16 1917 07/17/16 2259   07/17/16 1930  metroNIDAZOLE (FLAGYL) IVPB 500 mg     500 mg 100 mL/hr over 60 Minutes Intravenous  Once 07/17/16 1917 07/17/16 2321        Subjective:   Vision Kittler was seen and examined today.  Somewhat upset over not been able to take a shower and eat food. Denies any dizziness, chest pain, shortness of breath, N/V new weakness, numbess, tingling. No acute events overnight.  Abdominal pain is improving per patient.  Objective:   Vitals:   07/18/16 0526 07/18/16 1340 07/18/16 2046 07/19/16 0438  BP: (!) 141/55 (!) 120/56 (!) 135/56 139/61  Pulse: 72 77 72 79  Resp: 17  18 17   Temp: 98 F (36.7 C) 99 F (37.2 C)  98.1 F (36.7 C) 97.8 F (36.6 C)  TempSrc: Oral Oral Oral Oral  SpO2: 98% 98% 94% 96%  Weight:      Height:        Intake/Output Summary (Last 24 hours) at 07/19/16 1249 Last data filed at 07/19/16 0439  Gross per 24 hour  Intake          2048.75 ml  Output              450 ml  Net          1598.75 ml     Wt Readings from Last 3 Encounters:  07/18/16 91.6 kg (202 lb)  07/06/16 92.4 kg (203 lb 9.6 oz)  06/18/16 92.6 kg (204 lb 4 oz)     Exam  General: Alert and oriented x 3, NAD  HEENT:    Neck: Supple, no JVD  Cardiovascular: S1 S2 Clear, RRR  Respiratory: CTAB  Gastrointestinal: Soft, NT, ND, nbs  Ext: no cyanosis clubbing or edema  Neuro: no new deficits  Skin: No rashes  Psych: Normal affect and demeanor, alert and oriented x3    Data Reviewed:  I have personally reviewed following labs and imaging studies  Micro Results No results found for this or any previous visit (from the past 240 hour(s)).  Radiology Reports Ct Abdomen Pelvis W Contrast  Result Date: 07/17/2016 CLINICAL DATA:  Stool coming from vagina. History of diverticulitis with fistula. EXAM: CT ABDOMEN AND PELVIS WITH CONTRAST TECHNIQUE: Multidetector CT imaging of the abdomen and pelvis was performed using the standard protocol following bolus administration of intravenous contrast. CONTRAST:  100 ml ISOVUE-300 IOPAMIDOL (ISOVUE-300) INJECTION 61% COMPARISON:  02/21/2016 FINDINGS: Lower chest: Mild dependent changes in the lung bases. Small esophageal hiatal hernia. Hepatobiliary: No focal liver abnormality is seen. Status post cholecystectomy. No biliary dilatation. Pancreas: Unremarkable. No pancreatic ductal dilatation or surrounding inflammatory changes. Spleen: Normal in size without focal abnormality. Adrenals/Urinary Tract: No adrenal gland nodules. Scarring in the lower pole left kidney. Sub cm cysts in the kidneys. No hydronephrosis or hydroureter. Bladder wall is not thickened and no  filling defects are demonstrated. Stomach/Bowel: Stomach and small bowel are decompressed. Stool-filled colon with scattered diverticula. The sigmoid colon demonstrates diffuse wall thickening with multiple diverticula. There is progression since previous study. Changes suggest regional colitis or diverticulitis. There is stranding demonstrated throughout the fat in the pelvis around the sigmoid colon. No discrete abscess is identified. Sigmoid colon is contiguous with the left side of the vagina with stranding  demonstrated between the sigmoid colon and the vagina. Gas within the area stranding consistent with fistula. Gas is present within the vagina. Appendix is normal. Vascular/Lymphatic: Aortic atherosclerosis. No enlarged abdominal or pelvic lymph nodes. Reproductive: Status post hysterectomy. No adnexal masses. Other: No abdominal wall hernia or abnormality. No abdominopelvic ascites. Musculoskeletal: Diffuse degenerative changes throughout the lumbar spine. Slight anterior subluxation at L4-5 probably is degenerative. IMPRESSION: Diffuse wall thickening of the sigmoid colon with diverticulosis. Changes may represent diverticulitis or regional colitis. Stranding in the pelvic fat without discrete abscess. Sigmoid colovaginal fistula is demonstrated with gas in the fistula tract and in the vagina. Electronically Signed   By: Lucienne Capers M.D.   On: 07/17/2016 21:28   Dg Chest Port 1 View  Result Date: 07/18/2016 CLINICAL DATA:  Shortness of breath EXAM: PORTABLE CHEST 1 VIEW COMPARISON:  Chest radiograph 08/26/2014 FINDINGS: The lungs are well inflated. Cardiomegaly is unchanged. No focal airspace consolidation or pulmonary edema. No pneumothorax or sizable pleural effusion. IMPRESSION: Unchanged cardiomegaly.  Lungs clear. Electronically Signed   By: Ulyses Jarred M.D.   On: 07/18/2016 01:41    Lab Data:  CBC:  Recent Labs Lab 07/17/16 1858 07/18/16 0635  WBC 13.1* 10.5  NEUTROABS 10.5*  --    HGB 12.2 11.2*  HCT 36.7 35.0*  MCV 80.3 81.6  PLT 269 123456   Basic Metabolic Panel:  Recent Labs Lab 07/17/16 1858 07/18/16 0635  NA 136 136  K 4.0 4.4  CL 102 103  CO2 23 25  GLUCOSE 100* 109*  BUN 18 14  CREATININE 1.00 0.84  CALCIUM 10.2 9.4   GFR: Estimated Creatinine Clearance: 62.7 mL/min (by C-G formula based on SCr of 0.84 mg/dL). Liver Function Tests:  Recent Labs Lab 07/17/16 1858  AST 20  ALT 14  ALKPHOS 73  BILITOT 0.8  PROT 6.8  ALBUMIN 3.7    Recent Labs Lab 07/17/16 1858  LIPASE 24   No results for input(s): AMMONIA in the last 168 hours. Coagulation Profile: No results for input(s): INR, PROTIME in the last 168 hours. Cardiac Enzymes: No results for input(s): CKTOTAL, CKMB, CKMBINDEX, TROPONINI in the last 168 hours. BNP (last 3 results) No results for input(s): PROBNP in the last 8760 hours. HbA1C: No results for input(s): HGBA1C in the last 72 hours. CBG: No results for input(s): GLUCAP in the last 168 hours. Lipid Profile: No results for input(s): CHOL, HDL, LDLCALC, TRIG, CHOLHDL, LDLDIRECT in the last 72 hours. Thyroid Function Tests: No results for input(s): TSH, T4TOTAL, FREET4, T3FREE, THYROIDAB in the last 72 hours. Anemia Panel: No results for input(s): VITAMINB12, FOLATE, FERRITIN, TIBC, IRON, RETICCTPCT in the last 72 hours. Urine analysis:    Component Value Date/Time   COLORURINE YELLOW 07/19/2016 0346   APPEARANCEUR CLEAR 07/19/2016 0346   LABSPEC 1.006 07/19/2016 0346   PHURINE 6.0 07/19/2016 0346   GLUCOSEU NEGATIVE 07/19/2016 0346   HGBUR NEGATIVE 07/19/2016 0346   BILIRUBINUR NEGATIVE 07/19/2016 0346   BILIRUBINUR small (A) 12/27/2015 1848   BILIRUBINUR neg 01/25/2015 1833   KETONESUR NEGATIVE 07/19/2016 0346   PROTEINUR NEGATIVE 07/19/2016 0346   UROBILINOGEN 4.0 12/27/2015 1848   NITRITE NEGATIVE 07/19/2016 0346   LEUKOCYTESUR NEGATIVE 07/19/2016 0346     Khani Paino M.D. [facility] 07/19/2016, 12:49 PM  Pager: [phone] Between 7am to 7pm - call Pager - [phone]  After 7pm go to [email or web address] - password [email or web address]  Call night coverage person covering after 7pm

## 2016-07-20 ENCOUNTER — Telehealth: Payer: Self-pay

## 2016-07-20 MED ORDER — CIPROFLOXACIN HCL 500 MG PO TABS
500.0000 mg | ORAL_TABLET | Freq: Two times a day (BID) | ORAL | Status: DC
  Administered 2016-07-20: 500 mg via ORAL
  Filled 2016-07-20: qty 1

## 2016-07-20 MED ORDER — PROMETHAZINE HCL 12.5 MG PO TABS: 12.5000 mg | ORAL_TABLET | Freq: Four times a day (QID) | ORAL | 0 refills | Status: DC | PRN

## 2016-07-20 MED ORDER — METRONIDAZOLE 500 MG PO TABS: 500.0000 mg | ORAL_TABLET | Freq: Three times a day (TID) | ORAL | 0 refills | Status: DC

## 2016-07-20 MED ORDER — METRONIDAZOLE 500 MG PO TABS
500.0000 mg | ORAL_TABLET | Freq: Three times a day (TID) | ORAL | Status: DC
  Administered 2016-07-20 (×2): 500 mg via ORAL
  Filled 2016-07-20 (×2): qty 1

## 2016-07-20 MED ORDER — CIPROFLOXACIN HCL 500 MG PO TABS: 500.0000 mg | ORAL_TABLET | Freq: Two times a day (BID) | ORAL | 0 refills | Status: DC

## 2016-07-20 NOTE — Telephone Encounter (Signed)
PATIENT WOULD LIKE Ellen Johnson TO KNOW THAT SHE HAS BEEN IN [place] LAST Tuesday. SHE HAS SEVERE DIVERTICULITIS AND A  SMALL HOLE IN HER INTESTINE. HER SURGEON IS DR. Rick Duff. SHE WILL NOT HAVE HER SURGERY UNTIL HER DIVERTICULITIS HAS BEEN TREATED. SHE SAID [facility] IS HER PCP AND SHE WANTED HER TO BE AWARE OF WHAT HAS BEEN GOING ON. BEST PHONE [phone] (CELL) [place]

## 2016-07-20 NOTE — Consult Note (Signed)
[facility] CM Primary Care Navigator  07/20/2016  Ellen Johnson May 15, 1936 [id number]    Met with patient at the bedside to identify discharge needs.  Patient states having worsening abdominal pain/ cramping, stool coming out from vagina and fever that had led to this admission. Patient endorses Dr.Kim [name] (same practice- since [name] retired) with [facility] as the primary care provider at present.   Patient shared using [facility] to obtain medications without difficulty.   Patient shares managing her own medications at home using "pill box" system.   She is independent with self care prior to admission and is able to drive. Her daughter Ellen Johnson) can be able to provide transportation to her doctors' appointments as stated.  Patient lives with son Ellen Johnson) and he will be the primary caregiver at home. Plan for discharge is home when stable per patient.  Patient expressed understanding to call primary care provider's office once discharged, for a post discharge follow-up appointment within a week or sooner if needs arise. Patient letter given for her reminder.  She denies any other needs or concerns at this time.    For additional questions please contact:  Edwena Felty A. Ronnika Collett, BSN, RN-BC [facility] PRIMARY CARE Navigator Cell: [phone]

## 2016-07-20 NOTE — Progress Notes (Signed)
D/C papers gone over with pt. Prescriptions given to pt. IV taken out. No questions/complaints. Pt. D/c'd successfully via w/c with her son.

## 2016-07-20 NOTE — Progress Notes (Signed)
  Subjective: Complaining of some nausea but otherwise doing well. Planning discharge later today.  Objective: Vital signs in last 24 hours: Temp:  [97.2 F (36.2 C)-98.6 F (37 C)] 97.2 F (36.2 C) (11/10 0451) Pulse Rate:  [74-77] 77 (11/10 0451) Resp:  [18] 18 (11/10 0451) BP: (123-168)/(56-74) 148/70 (11/10 0600) SpO2:  [94 %-96 %] 96 % (11/10 0451) Last BM Date: 07/18/16 Afebrile, VSS No labs  Intake/Output from previous day: 11/09 0701 - 11/10 0700 In: 977 [I.V.:977] Out: -  Intake/Output this shift: Total I/O In: 240 [P.O.:240] Out: 500 [Urine:500]  General appearance: alert, cooperative and no distress GI: soft, non-tender; bowel sounds normal; no masses,  no organomegaly  Lab Results:   Recent Labs  07/17/16 1858 07/18/16 0635  WBC 13.1* 10.5  HGB 12.2 11.2*  HCT 36.7 35.0*  PLT 269 260    BMET  Recent Labs  07/17/16 1858 07/18/16 0635  NA 136 136  K 4.0 4.4  CL 102 103  CO2 23 25  GLUCOSE 100* 109*  BUN 18 14  CREATININE 1.00 0.84  CALCIUM 10.2 9.4   PT/INR No results for input(s): LABPROT, INR in the last 72 hours.   Recent Labs Lab 07/17/16 1858  AST 20  ALT 14  ALKPHOS 73  BILITOT 0.8  PROT 6.8  ALBUMIN 3.7     Lipase     Component Value Date/Time   LIPASE 24 07/17/2016 1858     Studies/Results: No results found.  Medications: . acidophilus  1 capsule Oral Daily  . ciprofloxacin  500 mg Oral BID  . enoxaparin (LOVENOX) injection  40 mg Subcutaneous Q24H  .  HYDROmorphone (DILAUDID) injection  1 mg Intravenous Once  . metroNIDAZOLE  500 mg Oral Q8H  . pantoprazole  40 mg Oral BID    Assessment/Plan Recurring abdominal pain and draining colovaginal fistula Colovaginal fistula with Hx of diverticulosis Surgery previously scheduled and deferred with resolution of sx; Dr. Leighton Ruff FEN: Soft diet ID: Cipro/Flagyl day 3 DVT: Lovenox     Plan: Follow-up in the office by Dr. Marcello Moores. 3 she Dr. Josem Kaufmann  assistance.    LOS: 2 days    Donalda Job 07/20/2016 [phone]

## 2016-07-20 NOTE — Discharge Summary (Signed)
Physician Discharge Summary   Patient ID: Ellen Johnson MRN: [id number] DOB/AGE: 78/21/1937 29 y.o.  Admit date: 07/17/2016 Discharge date: 07/20/2016  Primary Care Physician:  Molli Barrows, FNP  Discharge Diagnoses:    . Diverticulitis . GERD . Colovaginal fistula . Leukocytosis   Consults:  Gastroenterology Gen. surgery  Recommendations for Outpatient Follow-up:  1. Patient is recommended to follow-up with Dr. Leighton Ruff to schedule the colovaginal fistula surgery 2. Please repeat CBC/BMET at next visit   DIET: Soft diet    Allergies:   Allergies  Allergen Reactions  . Valium [Diazepam] Other (See Comments)    Totally different personality     DISCHARGE MEDICATIONS: Current Discharge Medication List    START taking these medications   Details  ciprofloxacin (CIPRO) 500 MG tablet Take 1 tablet (500 mg total) by mouth 2 (two) times daily. Qty: 20 tablet, Refills: 0    metroNIDAZOLE (FLAGYL) 500 MG tablet Take 1 tablet (500 mg total) by mouth 3 (three) times daily. Qty: 30 tablet, Refills: 0    promethazine (PHENERGAN) 12.5 MG tablet Take 1 tablet (12.5 mg total) by mouth every 6 (six) hours as needed for nausea or vomiting. Qty: 30 tablet, Refills: 0      CONTINUE these medications which have NOT CHANGED   Details  acidophilus (RISAQUAD) CAPS capsule Take 1 capsule by mouth daily.     cholecalciferol (VITAMIN D) 1000 UNITS tablet Take 1,000 Units by mouth daily.    fish oil-omega-3 fatty acids 1000 MG capsule Take 1 g by mouth daily.     furosemide (LASIX) 20 MG tablet Take 1 tablet (20 mg total) by mouth daily. Qty: 30 tablet, Refills: 0   Associated Diagnoses: Pedal edema    Ginkgo Biloba 40 MG TABS Take 1 tablet by mouth daily.     Multiple Vitamin (MULTIVITAMIN) tablet Take 1 tablet by mouth daily.    omeprazole (PRILOSEC) 40 MG capsule Take 40 mg by mouth daily.    vitamin C (ASCORBIC ACID) 500 MG tablet Take 500 mg by mouth daily.  Takes only during winter months      STOP taking these medications     Cyanocobalamin (VITAMIN B 12 PO)      IRON PO      cephALEXin (KEFLEX) 500 MG capsule          Brief H and P: For complete details please refer to admission H and P, but in brief Patient is a 80 year old female with a history of diverticulitis and colovaginal fistula also followed by Dr. Benson Norway was presented with worsening of abdominal pain and stool coming out of the vagina. Patient was initially scheduled for her surgery in the May but had resolution of her symptoms with antibiotics and decided to delay her surgery. The patient reported that she was having lower abdominal cramping with acute onset of stool from her vagina and associated symptoms of fevers, chills, dizziness and hence reported to the ER.Lab work revealed WBC of 13.1, BUN 18, creatinine 1, and all other vitals relatively within normal limits. CT scan of the abdomen showeddiverticulitis with a colovaginal fistula and general surgery was consulted.   Hospital Course:  Diverticulitis/ Colovaginal fistula: Acute on chronic. -  CT scan showed acute diverticulitis With colovaginal fistula - Patient was placed on empiric IV ciprofloxacin and metronidazole  - Appreciate surgery 's recommendations, recommended outpatient follow-up with Dr. Leighton Ruff and schedule the surgery - General surgery and GI were consulted. Initially patient was placed on  clear liquid diet and slowly was advanced to soft diet which patient is tolerating before the discharge. She is recommended oral ciprofloxacin and Flagyl for 10 days, and strongly recommended to schedule outpatient follow-up and surgery for the colovaginal fistula. She had postponed her prior surgery which was scheduled in May.  Leukocytosis:WBC elevated at 13.1. likely secondary to above - improving, wbc 10.5 at the time of discharge  GERD - Continue ppi   Day of Discharge BP (!) 148/70 (BP Location:  Right Arm)   Pulse 77   Temp 97.2 F (36.2 C) (Oral)   Resp 18   Ht 5\' 5"  (1.651 m)   Wt 91.6 kg (202 lb)   SpO2 96%   BMI 33.61 kg/m   Physical Exam: General: Alert and awake oriented x3 not in any acute distress. HEENT: anicteric sclera, pupils reactive to light and accommodation CVS: S1-S2 clear no murmur rubs or gallops Chest: clear to auscultation bilaterally, no wheezing rales or rhonchi Abdomen: soft nontender, nondistended, normal bowel sounds Extremities: no cyanosis, clubbing or edema noted bilaterally Neuro: Cranial nerves II-XII intact, no focal neurological deficits   The results of significant diagnostics from this hospitalization (including imaging, microbiology, ancillary and laboratory) are listed below for reference.    LAB RESULTS: Basic Metabolic Panel:  Recent Labs Lab 07/17/16 1858 07/18/16 0635  NA 136 136  K 4.0 4.4  CL 102 103  CO2 23 25  GLUCOSE 100* 109*  BUN 18 14  CREATININE 1.00 0.84  CALCIUM 10.2 9.4   Liver Function Tests:  Recent Labs Lab 07/17/16 1858  AST 20  ALT 14  ALKPHOS 73  BILITOT 0.8  PROT 6.8  ALBUMIN 3.7    Recent Labs Lab 07/17/16 1858  LIPASE 24   No results for input(s): AMMONIA in the last 168 hours. CBC:  Recent Labs Lab 07/17/16 1858 07/18/16 0635  WBC 13.1* 10.5  NEUTROABS 10.5*  --   HGB 12.2 11.2*  HCT 36.7 35.0*  MCV 80.3 81.6  PLT 269 260   Cardiac Enzymes: No results for input(s): CKTOTAL, CKMB, CKMBINDEX, TROPONINI in the last 168 hours. BNP: Invalid input(s): POCBNP CBG: No results for input(s): GLUCAP in the last 168 hours.  Significant Diagnostic Studies:  Ct Abdomen Pelvis W Contrast  Result Date: 07/17/2016 CLINICAL DATA:  Stool coming from vagina. History of diverticulitis with fistula. EXAM: CT ABDOMEN AND PELVIS WITH CONTRAST TECHNIQUE: Multidetector CT imaging of the abdomen and pelvis was performed using the standard protocol following bolus administration of intravenous  contrast. CONTRAST:  100 ml ISOVUE-300 IOPAMIDOL (ISOVUE-300) INJECTION 61% COMPARISON:  02/21/2016 FINDINGS: Lower chest: Mild dependent changes in the lung bases. Small esophageal hiatal hernia. Hepatobiliary: No focal liver abnormality is seen. Status post cholecystectomy. No biliary dilatation. Pancreas: Unremarkable. No pancreatic ductal dilatation or surrounding inflammatory changes. Spleen: Normal in size without focal abnormality. Adrenals/Urinary Tract: No adrenal gland nodules. Scarring in the lower pole left kidney. Sub cm cysts in the kidneys. No hydronephrosis or hydroureter. Bladder wall is not thickened and no filling defects are demonstrated. Stomach/Bowel: Stomach and small bowel are decompressed. Stool-filled colon with scattered diverticula. The sigmoid colon demonstrates diffuse wall thickening with multiple diverticula. There is progression since previous study. Changes suggest regional colitis or diverticulitis. There is stranding demonstrated throughout the fat in the pelvis around the sigmoid colon. No discrete abscess is identified. Sigmoid colon is contiguous with the left side of the vagina with stranding demonstrated between the sigmoid colon and the vagina. Gas  within the area stranding consistent with fistula. Gas is present within the vagina. Appendix is normal. Vascular/Lymphatic: Aortic atherosclerosis. No enlarged abdominal or pelvic lymph nodes. Reproductive: Status post hysterectomy. No adnexal masses. Other: No abdominal wall hernia or abnormality. No abdominopelvic ascites. Musculoskeletal: Diffuse degenerative changes throughout the lumbar spine. Slight anterior subluxation at L4-5 probably is degenerative. IMPRESSION: Diffuse wall thickening of the sigmoid colon with diverticulosis. Changes may represent diverticulitis or regional colitis. Stranding in the pelvic fat without discrete abscess. Sigmoid colovaginal fistula is demonstrated with gas in the fistula tract and in the  vagina. Electronically Signed   By: Lucienne Capers M.D.   On: 07/17/2016 21:28   Dg Chest Port 1 View  Result Date: 07/18/2016 CLINICAL DATA:  Shortness of breath EXAM: PORTABLE CHEST 1 VIEW COMPARISON:  Chest radiograph 08/26/2014 FINDINGS: The lungs are well inflated. Cardiomegaly is unchanged. No focal airspace consolidation or pulmonary edema. No pneumothorax or sizable pleural effusion. IMPRESSION: Unchanged cardiomegaly.  Lungs clear. Electronically Signed   By: Ulyses Jarred M.D.   On: 07/18/2016 01:41    2D ECHO:   Disposition and Follow-up: Discharge Instructions    Discharge instructions    Complete by:  As directed    SOFT DIET   Increase activity slowly    Complete by:  As directed        DISPOSITION: home    [place]    Rosario Adie., MD Follow up.   Specialty:  General Surgery Why:  Call for follow up appointment, plan to do it after you have competed antibiotic course. Contact information: [address] [place] [address] [phone]        HUNG,PATRICK D, MD. Schedule an appointment as soon as possible for a visit in 2 week(s).   Specialty:  Gastroenterology Contact information: [address] [place] [address] [phone]            Time spent on Discharge: 23mins   Signed:   Jahquez Steffler M.D. [facility] 07/20/2016, 11:19 AM Pager: [phone]

## 2016-07-23 ENCOUNTER — Ambulatory Visit: Payer: Medicare Other

## 2016-08-10 ENCOUNTER — Other Ambulatory Visit: Payer: Self-pay | Admitting: Physician Assistant

## 2016-08-10 DIAGNOSIS — R600 Localized edema: Secondary | ICD-10-CM

## 2016-08-12 NOTE — Telephone Encounter (Signed)
06/2016 last ov 07/2016 last lab

## 2016-08-12 NOTE — Telephone Encounter (Signed)
Medication has been filled 

## 2016-08-14 ENCOUNTER — Other Ambulatory Visit: Payer: Self-pay | Admitting: Family Medicine

## 2016-08-14 NOTE — Telephone Encounter (Signed)
Last seen 07/06/16

## 2016-09-12 ENCOUNTER — Ambulatory Visit: Payer: Medicare Other

## 2016-09-12 ENCOUNTER — Telehealth: Payer: Self-pay

## 2016-09-12 ENCOUNTER — Ambulatory Visit (INDEPENDENT_AMBULATORY_CARE_PROVIDER_SITE_OTHER): Payer: Medicare HMO | Admitting: Family Medicine

## 2016-09-12 VITALS — BP 106/60 | HR 96 | Temp 97.6°F | Resp 17 | Ht 65.0 in | Wt 190.0 lb

## 2016-09-12 DIAGNOSIS — R21 Rash and other nonspecific skin eruption: Secondary | ICD-10-CM

## 2016-09-12 DIAGNOSIS — E538 Deficiency of other specified B group vitamins: Secondary | ICD-10-CM | POA: Diagnosis not present

## 2016-09-12 DIAGNOSIS — K5732 Diverticulitis of large intestine without perforation or abscess without bleeding: Secondary | ICD-10-CM

## 2016-09-12 DIAGNOSIS — J069 Acute upper respiratory infection, unspecified: Secondary | ICD-10-CM

## 2016-09-12 MED ORDER — AMOXICILLIN 875 MG PO TABS: 875.0000 mg | ORAL_TABLET | Freq: Two times a day (BID) | ORAL | 0 refills | Status: DC

## 2016-09-12 MED ORDER — CLOTRIMAZOLE-BETAMETHASONE 1-0.05 % EX CREA: 1.0000 "application " | TOPICAL_CREAM | Freq: Two times a day (BID) | CUTANEOUS | 0 refills | Status: DC

## 2016-09-12 MED ORDER — CYANOCOBALAMIN 1000 MCG/ML IJ SOLN
1000.0000 ug | INTRAMUSCULAR | Status: DC
  Administered 2016-12-19 – 2017-10-23 (×4): 1000 ug via INTRAMUSCULAR

## 2016-09-12 NOTE — Progress Notes (Signed)
Patient ID: Ellen Johnson, female    DOB: [birth year], 81 y.o.   MRN: [id number]  PCP: Molli Barrows, FNP  Chief Complaint  Patient presents with  . Nasal Congestion  . Follow-up    pneumonia     Subjective:  HPI  81 year old female presents for evaluation nasal congestion, nausea, and upper chest cough greater than one week.  Acute Bronchitis  Patient reports that she has taken Mucinex and Vitamin C without relief of symptoms. Reports nasal congestion and drainage, dizziness, and facial heaviness. Denies fever although reports some nausea.   Diverticulitis  Patients would like another CT scan. Pt is under the care of Dr. Erroll Luna in General Surgery who has been evaluating her for diverticulitis and considering surgery for correction symptoms. The patient is concerned that the condition may have worsened and would like another CT scan to review status.  Hand and Foot Rash Patient reports a persistent rash bilateral hands and feet. She associates development of the rash with receipt of the pneumococcal vaccine. Rash itches sometimes, more often the skin is cracked with excoriation.  Social History   Social History  . Marital status: Widowed    Spouse name: N/A  . Number of children: N/A  . Years of education: N/A   Occupational History  . Not on file.   Social History Main Topics  . Smoking status: Never Smoker  . Smokeless tobacco: Never Used  . Alcohol use 1.2 oz/week    2 Standard drinks or equivalent per week  . Drug use: Unknown  . Sexual activity: No   Other Topics Concern  . Not on file   Social History Narrative  . No narrative on file    Family History  Problem Relation Age of Onset  . Stroke Mother   . Cancer Father     Colon  . Stroke Maternal Grandmother   . Breast cancer Paternal Grandmother    Review of Systems  HPI  Patient Active Problem List   Diagnosis Date Noted  . Leukocytosis 07/18/2016  . Diverticulitis 07/17/2016  .  HTN (hypertension)   . GERD (gastroesophageal reflux disease)   . Diverticulosis   . Colovaginal fistula 03/28/2016  . DIVERTICULOSIS OF COLON 07/30/2008  . DYSPHAGIA UNSPECIFIED 07/30/2008  . ABDOMINAL PAIN, LEFT LOWER QUADRANT 07/30/2008  . ANEMIA-NOS 05/11/2008  . HYPERTENSION, SYSTOLIC, BORDERLINE 0000000  . GERD 05/11/2008  . HEADACHE 05/11/2008  . COLONIC POLYPS, HX OF 11/28/2007    Allergies  Allergen Reactions  . Valium [Diazepam] Other (See Comments)    Totally different personality    Prior to Admission medications   Medication Sig Start Date End Date Taking? Authorizing Provider  acidophilus (RISAQUAD) CAPS capsule Take 1 capsule by mouth daily.    Yes Historical Provider, MD  cholecalciferol (VITAMIN D) 1000 UNITS tablet Take 1,000 Units by mouth daily.   Yes Historical Provider, MD  ciprofloxacin (CIPRO) 500 MG tablet Take 1 tablet (500 mg total) by mouth 2 (two) times daily. 07/20/16  Yes Ripudeep Krystal Eaton, MD  fish oil-omega-3 fatty acids 1000 MG capsule Take 1 g by mouth daily.    Yes Historical Provider, MD  furosemide (LASIX) 20 MG tablet TAKE 1 TABLET (20 MG TOTAL) BY MOUTH DAILY. 08/12/16  Yes Sedalia Muta, FNP  Ginkgo Biloba 40 MG TABS Take 1 tablet by mouth daily.    Yes Historical Provider, MD  hydrOXYzine (ATARAX/VISTARIL) 25 MG tablet TAKE 1 OR 2 TABLETS BY MOUTH EVERY  6 HOURS AS NEEDED FOR ITCHING 08/14/16  Yes Sedalia Muta, FNP  metroNIDAZOLE (FLAGYL) 500 MG tablet Take 1 tablet (500 mg total) by mouth 3 (three) times daily. 07/20/16  Yes Ripudeep Krystal Eaton, MD  Multiple Vitamin (MULTIVITAMIN) tablet Take 1 tablet by mouth daily.   Yes Historical Provider, MD  omeprazole (PRILOSEC) 40 MG capsule Take 40 mg by mouth daily.   Yes Historical Provider, MD  promethazine (PHENERGAN) 12.5 MG tablet Take 1 tablet (12.5 mg total) by mouth every 6 (six) hours as needed for nausea or vomiting. 07/20/16  Yes Ripudeep Krystal Eaton, MD  vitamin C (ASCORBIC  ACID) 500 MG tablet Take 500 mg by mouth daily. Takes only during winter months   Yes Historical Provider, MD    Past Medical, Surgical Family and Social History reviewed and updated.    Objective:   Today's Vitals   09/12/16 1546  BP: 106/60  Pulse: 96  Resp: 17  Temp: 97.6 F (36.4 C)  TempSrc: Oral  SpO2: 94%  Weight: 190 lb (86.2 kg)  Height: 5\' 5"  (1.651 m)    Wt Readings from Last 3 Encounters:  09/12/16 190 lb (86.2 kg)  07/18/16 202 lb (91.6 kg)  07/06/16 203 lb 9.6 oz (92.4 kg)   Physical Exam  Constitutional: She is oriented to person, place, and time. She appears well-developed and well-nourished.  HENT:  Head: Normocephalic.  Eyes: Conjunctivae are normal. Pupils are equal, round, and reactive to light.  Neck: Normal range of motion. Neck supple.  Cardiovascular: Normal rate, regular rhythm, normal heart sounds and intact distal pulses.   Pulmonary/Chest: Effort normal and breath sounds normal.  Neurological: She is alert and oriented to person, place, and time.  Skin: Skin is warm and dry.  Dry and excoriated left hand worse with bilateral feet  Psychiatric: She has a normal mood and affect. Her behavior is normal. Judgment and thought content normal.     Assessment & Plan:  1. Upper respiratory tract infection, unspecified type Plan: -Amoxicillin 875 mg twice daily x 10 days.  2. Rash and nonspecific skin eruption Plan: -Lotrisone cream with Eucerin cream mixed 1:1,  twice daily to rash - Ambulatory referral to Dermatology  3. B12 deficiency Plan: - cyanocobalamin ((VITAMIN B-12)) injection 1,000 mcg; Inject 1 mL (1,000 mcg total) into the muscle every 30 (thirty) days.   4. Diverticulitis of Colon Sent a referral request for Dr. Marcello Moores as patient would like another CT scan regarding dx of diverticulitis. Advised patient since Dr. Marcello Moores is already consulting surgeon the order for an additional CT scan would need to come from her.  Carroll Sage.  [name], MSN, FNP-C [facility]

## 2016-09-12 NOTE — Patient Instructions (Addendum)
Upper respiratory illness:   Take 875 mg of Amoxicillin twice daily for ten days.  Apply Lotrisone cream to skin irritation on hands and feet, twice daily.  I will send an urgent referral to Dr. Erroll Luna requesting follow-up on Diverticulitis and indicate that you would like and addition CT scan.  IF you received an x-ray today, you will receive an invoice from [facility]. Please contact [facility] at [phone] with questions or concerns regarding your invoice.   IF you received labwork today, you will receive an invoice from [place]. Please contact [facility] at [phone] with questions or concerns regarding your invoice.   Our billing staff will not be able to assist you with questions regarding bills from these companies.  You will be contacted with the lab results as soon as they are available. The fastest way to get your results is to activate your My Chart account. Instructions are located on the last page of this paperwork. If you have not heard from [name] regarding the results in 2 weeks, please contact this office.     Upper Respiratory Infection, Adult Most upper respiratory infections (URIs) are caused by a virus. A URI affects the nose, throat, and upper air passages. The most common type of URI is often called "the common cold." Follow these instructions at home:  Take medicines only as told by your doctor.  Gargle warm saltwater or take cough drops to comfort your throat as told by your doctor.  Use a warm mist humidifier or inhale steam from a shower to increase air moisture. This may make it easier to breathe.  Drink enough fluid to keep your pee (urine) clear or pale yellow.  Eat soups and other clear broths.  Have a healthy diet.  Rest as needed.  Go back to work when your fever is gone or your doctor says it is okay.  You may need to stay home longer to avoid giving your URI to others.  You can also wear a face mask and wash your  hands often to prevent spread of the virus.  Use your inhaler more if you have asthma.  Do not use any tobacco products, including cigarettes, chewing tobacco, or electronic cigarettes. If you need help quitting, ask your doctor. Contact a doctor if:  You are getting worse, not better.  Your symptoms are not helped by medicine.  You have chills.  You are getting more short of breath.  You have brown or red mucus.  You have yellow or brown discharge from your nose.  You have pain in your face, especially when you bend forward.  You have a fever.  You have puffy (swollen) neck glands.  You have pain while swallowing.  You have white areas in the back of your throat. Get help right away if:  You have very bad or constant:  Headache.  Ear pain.  Pain in your forehead, behind your eyes, and over your cheekbones (sinus pain).  Chest pain.  You have long-lasting (chronic) lung disease and any of the following:  Wheezing.  Long-lasting cough.  Coughing up blood.  A change in your usual mucus.  You have a stiff neck.  You have changes in your:  Vision.  Hearing.  Thinking.  Mood. This information is not intended to replace advice given to you by your health care provider. Make sure you discuss any questions you have with your health care provider. Document Released: 02/13/2008 Document Revised: 04/29/2016 Document Reviewed: 12/02/2013 Elsevier Interactive Patient  Education  2017 [employer].

## 2016-09-12 NOTE — Telephone Encounter (Signed)
Pt states the meds that she was given today by [name] is too expensive and needs to given something else   Best number [phone]

## 2016-09-13 NOTE — Telephone Encounter (Signed)
lmtcb

## 2016-09-14 ENCOUNTER — Telehealth: Payer: Self-pay

## 2016-09-14 NOTE — Telephone Encounter (Signed)
lvm for pt regarding dermatology referral her appt is scheduled for 11/13/16 at 8:45am needs to arrive at 8:30am at [facility] in [facility]

## 2016-09-18 ENCOUNTER — Telehealth: Payer: Self-pay | Admitting: *Deleted

## 2016-09-18 NOTE — Telephone Encounter (Signed)
Patient called to let you know the cream you have gave her has worked very well, and she is so pleased. Patient stated that you are very wonderful.

## 2016-10-01 ENCOUNTER — Other Ambulatory Visit: Payer: Self-pay | Admitting: Family Medicine

## 2016-10-01 DIAGNOSIS — I872 Venous insufficiency (chronic) (peripheral): Secondary | ICD-10-CM

## 2016-10-08 ENCOUNTER — Ambulatory Visit: Payer: Medicare HMO

## 2016-12-12 ENCOUNTER — Ambulatory Visit: Payer: Medicare HMO

## 2016-12-19 ENCOUNTER — Ambulatory Visit (INDEPENDENT_AMBULATORY_CARE_PROVIDER_SITE_OTHER): Payer: Medicare HMO | Admitting: Physician Assistant

## 2016-12-19 ENCOUNTER — Ambulatory Visit: Payer: Medicare HMO

## 2016-12-19 VITALS — BP 136/70 | HR 74 | Temp 99.2°F | Resp 16 | Ht 65.0 in | Wt 199.0 lb

## 2016-12-19 DIAGNOSIS — N824 Other female intestinal-genital tract fistulae: Secondary | ICD-10-CM

## 2016-12-19 DIAGNOSIS — E538 Deficiency of other specified B group vitamins: Secondary | ICD-10-CM

## 2016-12-19 DIAGNOSIS — D649 Anemia, unspecified: Secondary | ICD-10-CM

## 2016-12-19 DIAGNOSIS — I10 Essential (primary) hypertension: Secondary | ICD-10-CM

## 2016-12-19 DIAGNOSIS — L989 Disorder of the skin and subcutaneous tissue, unspecified: Secondary | ICD-10-CM

## 2016-12-19 MED ORDER — CYANOCOBALAMIN 1000 MCG/ML IJ SOLN: 1000.0000 ug | Freq: Once | INTRAMUSCULAR | 0 refills | Status: AC

## 2016-12-19 NOTE — Progress Notes (Signed)
Patient ID: Ellen Johnson, female    DOB: 09-23-1935, 81 y.o.   MRN: [id number]  PCP: Ellen Barrows, FNP  Chief Complaint  Patient presents with  . B12 Injection    wants to talk to you about a scan     Subjective:   Presents for B12 injection and requests a scan order.  She receives monthly B12 injections for anemia.  Also, she reports a "hole in my colon." She has a rectovaginal fistula and has been referred for surgical repair. It's not entirely clear why she did not have the surgery, though she relates that she doesn't have stool from the vagina daily and it's not usually bothersome. However, for the past month, she notes blood mixed with the stool from the vagina on a weekly basis. For this reason, she desires an updated CT scan.  In addition, she requests a referral back to her dermatologist for evaluation of a skin lesion.  Ms. [name] has left this office. Patient desires to remain here for primary care and asks that I take on the role..    Review of Systems Constitutional: Negative for activity change, appetite change, fatigueand unexpected weight change.  HENT: Negative for congestion, dental problem, ear pain, hearing loss, mouth sores, postnasal drip, rhinorrhea, sneezing, sore throat, tinnitusand trouble swallowing.  Eyes: Negative for photophobia, pain, rednessand visual disturbance.  Respiratory: Negative for cough, chest tightnessand shortness of breath.  Cardiovascular: Negative for chest pain, palpitationsand leg swelling.  Gastrointestinal: Negative for abdominal pain, blood in stool, constipation, diarrhea, nauseaand vomiting.  Genitourinary: Negative for dysuria, frequency, hematuriaand urgency.  Musculoskeletal: Negative for arthralgias, gait problem, myalgiasand neck stiffness.  Skin: Negative for rash.  Neurological: Negative for dizziness, speech difficulty, weakness, light-headedness, numbnessand headaches.  Hematological: Negative for  adenopathy.  Psychiatric/Behavioral: Negative for confusionand sleep disturbance. The patient is not nervous/anxious.     Patient Active Problem List   Diagnosis Date Noted  . Leukocytosis 07/18/2016  . Diverticulitis 07/17/2016  . HTN (hypertension)   . GERD (gastroesophageal reflux disease)   . Diverticulosis   . Colovaginal fistula 03/28/2016  . DIVERTICULOSIS OF COLON 07/30/2008  . DYSPHAGIA UNSPECIFIED 07/30/2008  . ABDOMINAL PAIN, LEFT LOWER QUADRANT 07/30/2008  . Anemia 05/11/2008  . HYPERTENSION, SYSTOLIC, BORDERLINE 96/78/9381  . GERD 05/11/2008  . HEADACHE 05/11/2008  . COLONIC POLYPS, HX OF 11/28/2007     Prior to Admission medications   Medication Sig Start Date End Date Taking? Authorizing Provider  acidophilus (RISAQUAD) CAPS capsule Take 1 capsule by mouth daily.    Yes Historical Provider, MD  cholecalciferol (VITAMIN D) 1000 UNITS tablet Take 1,000 Units by mouth daily.   Yes Historical Provider, MD  clotrimazole-betamethasone (LOTRISONE) cream Apply 1 application topically 2 (two) times daily. 09/12/16  Yes Sedalia Muta, FNP  fish oil-omega-3 fatty acids 1000 MG capsule Take 1 g by mouth daily.    Yes Historical Provider, MD  furosemide (LASIX) 20 MG tablet TAKE 1 TABLET (20 MG TOTAL) BY MOUTH DAILY. 08/12/16  Yes Sedalia Muta, FNP  Ginkgo Biloba 40 MG TABS Take 1 tablet by mouth daily.    Yes Historical Provider, MD  hydrOXYzine (ATARAX/VISTARIL) 25 MG tablet TAKE 1 OR 2 TABLETS BY MOUTH EVERY 6 HOURS AS NEEDED FOR ITCHING 08/14/16  Yes Sedalia Muta, FNP  Multiple Vitamin (MULTIVITAMIN) tablet Take 1 tablet by mouth daily.   Yes Historical Provider, MD  omeprazole (PRILOSEC) 40 MG capsule Take 40 mg by mouth daily.  Yes Historical Provider, MD  promethazine (PHENERGAN) 12.5 MG tablet Take 1 tablet (12.5 mg total) by mouth every 6 (six) hours as needed for nausea or vomiting. 07/20/16  Yes Ripudeep K Rai, MD  triamcinolone  cream (KENALOG) 0.1 % APPLY TO AFFECTED AREA TWICE A DAY 10/03/16  Yes Dorian Heckle English, PA  vitamin C (ASCORBIC ACID) 500 MG tablet Take 500 mg by mouth daily. Takes only during winter months   Yes Historical Provider, MD  cyanocobalamin (,VITAMIN B-12,) 1000 MCG/ML injection Inject 1 mL (1,000 mcg total) into the muscle once. 12/19/16 12/19/16  Harrison Mons, PA-C     Allergies  Allergen Reactions  . Valium [Diazepam] Other (See Comments)    Totally different personality       Objective:  Physical Exam  Constitutional: She is oriented to person, place, and time. She appears well-developed and well-nourished. She is active and cooperative. No distress.  BP 136/70   Pulse 74   Temp 99.2 F (37.3 C) (Oral)   Resp 16   Ht 5\' 5"  (1.651 m)   Wt 199 lb (90.3 kg)   SpO2 96%   BMI 33.12 kg/m   HENT:  Head: Normocephalic and atraumatic.  Right Ear: Hearing normal.  Left Ear: Hearing normal.  Eyes: Conjunctivae are normal. No scleral icterus.  Neck: Normal range of motion. Neck supple. No thyromegaly present.  Cardiovascular: Normal rate, regular rhythm and normal heart sounds.   Pulses:      Radial pulses are 2+ on the right side, and 2+ on the left side.  Pulmonary/Chest: Effort normal and breath sounds normal.  Lymphadenopathy:       Head (right side): No tonsillar, no preauricular, no posterior auricular and no occipital adenopathy present.       Head (left side): No tonsillar, no preauricular, no posterior auricular and no occipital adenopathy present.    She has no cervical adenopathy.       Right: No supraclavicular adenopathy present.       Left: No supraclavicular adenopathy present.  Neurological: She is alert and oriented to person, place, and time. No sensory deficit.  Skin: Skin is warm, dry and intact. No rash noted. No cyanosis or erythema. Nails show no clubbing.  Psychiatric: She has a normal mood and affect. Her speech is normal and behavior is normal.            Assessment & Plan:   Problem List Items Addressed This Visit    Anemia - Primary    Continue monthly B12 injections.      Colovaginal fistula    Given the new symptoms of hematochezia, CT scan update is warranted. Also encouraged her to follow-up with her GI specialist and the surgeon.      Relevant Orders   CT Abdomen Pelvis W Contrast   Basic metabolic panel (Completed)   HTN (hypertension)    Controlled. Continue current treatment.      Relevant Orders   Basic metabolic panel (Completed)    Other Visit Diagnoses    Skin lesion       recurrent lesion.    Relevant Orders   Ambulatory referral to Dermatology       No Follow-up on file.   Fara Chute, PA-C [facility]

## 2016-12-19 NOTE — Patient Instructions (Signed)
     IF you received an x-ray today, you will receive an invoice from [facility]. Please contact [facility] at [phone] with questions or concerns regarding your invoice.   IF you received labwork today, you will receive an invoice from [facility]. Please contact [facility] at [phone] with questions or concerns regarding your invoice.   Our billing staff will not be able to assist you with questions regarding bills from these companies.  You will be contacted with the lab results as soon as they are available. The fastest way to get your results is to activate your My Chart account. Instructions are located on the last page of this paperwork. If you have not heard from us regarding the results in 2 weeks, please contact this office.     

## 2016-12-19 NOTE — Progress Notes (Signed)
THIS NOTE IS USED FOR EDUCATIONAL PURPOSES ONLY!!!   Name: Ellen Johnson  DOB: 10-29-1935  Age: 81 y.o. Sex: female  CC:  Chief Complaint  Patient presents with  . B12 Injection    wants to talk to you about a scan     PCP: Molli Barrows, FNP  HPI: Patient reports that she has been getting a B12 shot every month and is here for the shot, she reports they have had a "hole in her colon and I was passing stool out of my vagina" after a previous diagnosis of diverticulitis and has been referred to another doctor (patient is unsure of MD- thinking it is Dr. Rosana Hoes) for surgical correction. Patient has x1 bout of stool mixed with blood coming out of her vagina weekly. She would like a repeat scan prior to her surgery but she is reporting that her sergeon refuses to get another scan prior to her surgery.   Patient reports she is established with Dr. Benson Norway for GI. Dr. Benson Norway is the MD who referred her to the GI surgeon.   Patient reports that she goes to Dr. Nevada Crane for Dermatology and has a recurrent skin lesion that she would like to be reevaluated.   ROS:  Constitutional: Negative for activity change, appetite change, fatigue and unexpected weight change.  HENT: Negative for congestion, dental problem, ear pain, hearing loss, mouth sores, postnasal drip, rhinorrhea, sneezing, sore throat, tinnitus and trouble swallowing.   Eyes: Negative for photophobia, pain, redness and visual disturbance.  Respiratory: Negative for cough, chest tightness and shortness of breath.   Cardiovascular: Negative for chest pain, palpitations and leg swelling.  Gastrointestinal: Negative for abdominal pain, blood in stool, constipation, diarrhea, nausea and vomiting.  Genitourinary: Negative for dysuria, frequency, hematuria and urgency.  Musculoskeletal: Negative for arthralgias, gait problem, myalgias and neck stiffness.  Skin: Negative for rash.  Neurological: Negative for dizziness, speech difficulty, weakness,  light-headedness, numbness and headaches.  Hematological: Negative for adenopathy.  Psychiatric/Behavioral: Negative for confusion and sleep disturbance. The patient is not nervous/anxious.    PMH:  Patient Active Problem List   Diagnosis Date Noted  . Leukocytosis 07/18/2016  . Diverticulitis 07/17/2016  . HTN (hypertension)   . GERD (gastroesophageal reflux disease)   . Diverticulosis   . Colovaginal fistula 03/28/2016  . DIVERTICULOSIS OF COLON 07/30/2008  . DYSPHAGIA UNSPECIFIED 07/30/2008  . ABDOMINAL PAIN, LEFT LOWER QUADRANT 07/30/2008  . ANEMIA-NOS 05/11/2008  . HYPERTENSION, SYSTOLIC, BORDERLINE 03/70/4888  . GERD 05/11/2008  . HEADACHE 05/11/2008  . COLONIC POLYPS, HX OF 11/28/2007    Allergies:  Allergies  Allergen Reactions  . Valium [Diazepam] Other (See Comments)    Totally different personality    Medications:  Current Outpatient Prescriptions on File Prior to Visit  Medication Sig Dispense Refill  . acidophilus (RISAQUAD) CAPS capsule Take 1 capsule by mouth daily.     Marland Kitchen amoxicillin (AMOXIL) 875 MG tablet Take 1 tablet (875 mg total) by mouth 2 (two) times daily. 20 tablet 0  . cholecalciferol (VITAMIN D) 1000 UNITS tablet Take 1,000 Units by mouth daily.    . ciprofloxacin (CIPRO) 500 MG tablet Take 1 tablet (500 mg total) by mouth 2 (two) times daily. 20 tablet 0  . clotrimazole-betamethasone (LOTRISONE) cream Apply 1 application topically 2 (two) times daily. 90 g 0  . fish oil-omega-3 fatty acids 1000 MG capsule Take 1 g by mouth daily.     . furosemide (LASIX) 20 MG tablet TAKE 1 TABLET (20 MG  TOTAL) BY MOUTH DAILY. 30 tablet 0  . Ginkgo Biloba 40 MG TABS Take 1 tablet by mouth daily.     . hydrOXYzine (ATARAX/VISTARIL) 25 MG tablet TAKE 1 OR 2 TABLETS BY MOUTH EVERY 6 HOURS AS NEEDED FOR ITCHING 40 tablet 4  . metroNIDAZOLE (FLAGYL) 500 MG tablet Take 1 tablet (500 mg total) by mouth 3 (three) times daily. 30 tablet 0  . Multiple Vitamin (MULTIVITAMIN)  tablet Take 1 tablet by mouth daily.    Marland Kitchen omeprazole (PRILOSEC) 40 MG capsule Take 40 mg by mouth daily.    . promethazine (PHENERGAN) 12.5 MG tablet Take 1 tablet (12.5 mg total) by mouth every 6 (six) hours as needed for nausea or vomiting. 30 tablet 0  . triamcinolone cream (KENALOG) 0.1 % APPLY TO AFFECTED AREA TWICE A DAY 454 g 3  . vitamin C (ASCORBIC ACID) 500 MG tablet Take 500 mg by mouth daily. Takes only during winter months     Current Facility-Administered Medications on File Prior to Visit  Medication Dose Route Frequency Provider Last Rate Last Dose  . cyanocobalamin ((VITAMIN B-12)) injection 1,000 mcg  1,000 mcg Intramuscular Q30 days Sedalia Muta, FNP        PE:  GS: WDWN female sitting on exam table in NAD.  Vitals: BP (!) 149/77 (BP Location: Right Arm, Patient Position: Sitting, Cuff Size: Small)   Pulse 74   Temp 99.2 F (37.3 C) (Oral)   Resp 16   Ht 5\' 5"  (1.651 m)   Wt 199 lb (90.3 kg)   SpO2 96%   BMI 33.12 kg/m  HEENT: Normocephalic, atruamatic. PEARRL. No cervical lymphadenopathy. No thyroid nodules, normal size, and equal bilaterally. Ears: Bilateral ears without erythema, TM clear with good coen of light. TM without bulging. Nose: Patent, without erythema or discharge. Mouth/Throat: Moist mucous membranes. No erythema. Tonsils without erythema or tonsillar exudate.  Cardiovascular: RRR. No S3 or S4. No murmurs, rubs, or gallops. Pulses 2+ and equal bilateral in the upper and lower extremities. No pitting edema. No varicosities, clubbing, or cyanosis.  Pulm: CTA bilaterally. No expiratory muscle use while breathing.  GI: +BS. NTND. No rigidity or guarding. No rebound tenderness. Neuro: CN 2-12 grossly intact.  Psych: A&O x 4. Mood and affect appropriate for situation.  Skin: Warm and dry. No rashes or excoriations on exposed skin.   A&P:  1. Anemia, unspecified type -  Plan: cyanocobalamin (,VITAMIN B-12,) 1000 MCG/ML injection  2. Skin  lesion - Previous history of lesion at same spot.  Plan: Ambulatory referral to Dermatology  3. Essential hypertension - controlled   Plan: Basic metabolic panel  4. Colovaginal fistula - seen by GI and surgery. Requesting re-imaging today.  Plan: CT Abdomen Pelvis W Contrast, Basic metabolic panel       Respectfully,  Delilah Shan, PA-S2

## 2016-12-20 LAB — BASIC METABOLIC PANEL
BUN/Creatinine Ratio: 16 (ref 12–28)
BUN: 14 mg/dL (ref 8–27)
CHLORIDE: 101 mmol/L (ref 96–106)
CO2: 25 mmol/L (ref 18–29)
Calcium: 9.6 mg/dL (ref 8.7–10.3)
Creatinine, Ser: 0.86 mg/dL (ref 0.57–1.00)
GFR calc Af Amer: 75 mL/min/{1.73_m2} (ref >59)
GFR calc non Af Amer: 65 mL/min/{1.73_m2} (ref >59)
GLUCOSE: 100 mg/dL — AB (ref 65–99)
POTASSIUM: 4.1 mmol/L (ref 3.5–5.2)
Sodium: 137 mmol/L (ref 134–144)

## 2016-12-21 ENCOUNTER — Encounter: Payer: Self-pay | Admitting: Physician Assistant

## 2016-12-23 NOTE — Assessment & Plan Note (Signed)
Given the new symptoms of hematochezia, CT scan update is warranted. Also encouraged her to follow-up with her GI specialist and the surgeon.

## 2016-12-23 NOTE — Assessment & Plan Note (Signed)
Controlled. Continue current treatment. 

## 2016-12-23 NOTE — Assessment & Plan Note (Signed)
Continue monthly B12 injections

## 2016-12-30 ENCOUNTER — Encounter (HOSPITAL_COMMUNITY): Payer: Self-pay | Admitting: Family Medicine

## 2016-12-30 ENCOUNTER — Ambulatory Visit (HOSPITAL_COMMUNITY)
Admission: EM | Admit: 2016-12-30 | Discharge: 2016-12-30 | Disposition: A | Discharge status: Home / Self Care | Payer: Medicare HMO | Attending: Family Medicine | Admitting: Family Medicine

## 2016-12-30 DIAGNOSIS — K5732 Diverticulitis of large intestine without perforation or abscess without bleeding: Secondary | ICD-10-CM | POA: Diagnosis not present

## 2016-12-30 DIAGNOSIS — C44511 Basal cell carcinoma of skin of breast: Secondary | ICD-10-CM

## 2016-12-30 NOTE — Discharge Instructions (Signed)
Keep your appointment with Dr. Nevada Crane as you will need further excision of the basal cell skin disease on the left chest.  I am referring you to Dr. Donnie Mesa for the diverticulitis.  My phone number is [phone]

## 2016-12-30 NOTE — ED Provider Notes (Signed)
[place]    CSN: [id number] Arrival date & time: 12/30/16  1920     History   Chief Complaint Chief Complaint  Patient presents with  . Wound Check    HPI Ellen Johnson is a 81 y.o. female.   This is a 81 year old woman who has a history of basal cell carcinoma in her left anterior chest. She's having more skin irritation recently after having had what appeared to be a successful excision.  She's also having intermittent bouts of diverticulitis. She would like a referral to several [place] surgery because she is not happy with the relationship she has with her current Psychologist, sport and exercise.  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      Past Medical History:  Diagnosis Date  . Diverticulosis   . GERD (gastroesophageal reflux disease)   . HTN (hypertension)     Patient Active Problem List   Diagnosis Date Noted  . Leukocytosis 07/18/2016  . Diverticulitis 07/17/2016  . HTN (hypertension)   . GERD (gastroesophageal reflux disease)   . Diverticulosis   . Colovaginal fistula 03/28/2016  . DIVERTICULOSIS OF COLON 07/30/2008  . DYSPHAGIA UNSPECIFIED 07/30/2008  . ABDOMINAL PAIN, LEFT LOWER QUADRANT 07/30/2008  . Anemia 05/11/2008  . HYPERTENSION, SYSTOLIC, BORDERLINE 09/12/7251  . GERD 05/11/2008  . HEADACHE 05/11/2008  . COLONIC POLYPS, HX OF 11/28/2007    Past Surgical History:  Procedure Laterality Date  . ABDOMINAL HYSTERECTOMY     TAH BSO  . FRACTURE SURGERY    . gall bladder removed      OB History    Gravida Para Term Preterm AB Living   4 4       4    SAB TAB Ectopic Multiple Live Births                   Home Medications    Prior to Admission medications   Medication Sig Start Date End Date Taking? Authorizing Provider  acidophilus (RISAQUAD) CAPS capsule Take 1 capsule by mouth daily.     Historical Provider, MD  amoxicillin (AMOXIL) 875 MG tablet Take 1 tablet (875 mg total) by mouth 2 (two) times daily.  09/12/16   Sedalia Muta, FNP  cholecalciferol (VITAMIN D) 1000 UNITS tablet Take 1,000 Units by mouth daily.    Historical Provider, MD  ciprofloxacin (CIPRO) 500 MG tablet Take 1 tablet (500 mg total) by mouth 2 (two) times daily. 07/20/16   Ripudeep Krystal Eaton, MD  clotrimazole-betamethasone (LOTRISONE) cream Apply 1 application topically 2 (two) times daily. 09/12/16   Sedalia Muta, FNP  fish oil-omega-3 fatty acids 1000 MG capsule Take 1 g by mouth daily.     Historical Provider, MD  furosemide (LASIX) 20 MG tablet TAKE 1 TABLET (20 MG TOTAL) BY MOUTH DAILY. 08/12/16   Sedalia Muta, FNP  Ginkgo Biloba 40 MG TABS Take 1 tablet by mouth daily.     Historical Provider, MD  hydrOXYzine (ATARAX/VISTARIL) 25 MG tablet TAKE 1 OR 2 TABLETS BY MOUTH EVERY 6 HOURS AS NEEDED FOR ITCHING 08/14/16   Sedalia Muta, FNP  Multiple Vitamin (MULTIVITAMIN) tablet Take 1 tablet by mouth daily.    Historical Provider, MD  omeprazole (PRILOSEC) 40 MG capsule Take 40 mg by mouth daily.    Historical Provider, MD  promethazine (PHENERGAN) 12.5 MG tablet Take 1 tablet (12.5 mg total) by mouth every 6 (six) hours as needed for nausea or vomiting. 07/20/16   Ripudeep  Krystal Eaton, MD  triamcinolone cream (KENALOG) 0.1 % APPLY TO AFFECTED AREA TWICE A DAY 10/03/16   Dorian Heckle English, PA  vitamin C (ASCORBIC ACID) 500 MG tablet Take 500 mg by mouth daily. Takes only during winter months    Historical Provider, MD    Family History Family History  Problem Relation Age of Onset  . Stroke Mother   . Cancer Father     Colon  . Stroke Maternal Grandmother   . Breast cancer Paternal Grandmother     Social History Social History  Substance Use Topics  . Smoking status: Never Smoker  . Smokeless tobacco: Never Used  . Alcohol use 1.2 oz/week    2 Standard drinks or equivalent per week     Allergies   Valium [diazepam]   Review of Systems Review of Systems  Constitutional:  Negative.   HENT: Negative.   Eyes: Negative.   Respiratory: Negative.   Skin: Positive for rash.     Physical Exam Triage Vital Signs ED Triage Vitals  Enc Vitals Group     BP      Pulse      Resp      Temp      Temp src      SpO2      Weight      Height      Head Circumference      Peak Flow      Pain Score      Pain Loc      Pain Edu?      Excl. in [place]?    No data found.   Updated Vital Signs BP (!) 151/71 (BP Location: Left Arm)   Pulse 82   Temp 98.6 F (37 C) (Oral)   Resp 18   SpO2 97%    Physical Exam  Constitutional: She is oriented to person, place, and time. She appears well-developed and well-nourished.  HENT:  Right Ear: External ear normal.  Left Ear: External ear normal.  Mouth/Throat: Oropharynx is clear and moist.  Eyes: Conjunctivae and EOM are normal. Pupils are equal, round, and reactive to light.  Neck: Normal range of motion. Neck supple.  Pulmonary/Chest: Effort normal and breath sounds normal.  Musculoskeletal: Normal range of motion.  Neurological: She is alert and oriented to person, place, and time.  Skin: There is erythema.  3 cm of indurated irregular erythematous skin superior to the left breast in the same distribution as previous basal cell excision.  Nursing note and vitals reviewed.    UC Treatments / Results  Labs (all labs ordered are listed, but only abnormal results are displayed) Labs Reviewed - No data to display  EKG  EKG Interpretation None       Radiology No results found.  Procedures Procedures (including critical care time)  Medications Ordered in UC Medications - No data to display   Initial Impression / Assessment and Plan / UC Course  I have reviewed the triage vital signs and the nursing notes.  Pertinent labs & imaging results that were available during my care of the patient were reviewed by me and considered in my medical decision making (see chart for details).     Final Clinical  Impressions(s) / UC Diagnoses   Final diagnoses:  Basal cell carcinoma of skin of breast, unspecified laterality  Diverticulitis of large intestine without bleeding, unspecified complication status    New Prescriptions New Prescriptions   No medications on file  [name], MD 12/30/16 2013

## 2016-12-30 NOTE — ED Triage Notes (Signed)
The patient presented to the [facility] with a complaint of a wound check.

## 2017-01-08 ENCOUNTER — Ambulatory Visit
Admission: RE | Admit: 2017-01-08 | Discharge: 2017-01-08 | Disposition: A | Discharge status: Home / Self Care | Payer: Medicare HMO | Source: Ambulatory Visit | Attending: Physician Assistant | Admitting: Physician Assistant

## 2017-01-08 ENCOUNTER — Telehealth: Payer: Self-pay

## 2017-01-08 DIAGNOSIS — N823 Fistula of vagina to large intestine: Secondary | ICD-10-CM | POA: Diagnosis not present

## 2017-01-08 DIAGNOSIS — N824 Other female intestinal-genital tract fistulae: Secondary | ICD-10-CM

## 2017-01-08 MED ORDER — IOPAMIDOL (ISOVUE-300) INJECTION 61%
100.0000 mL | Freq: Once | INTRAVENOUS | Status: AC | PRN
  Administered 2017-01-08: 100 mL via INTRAVENOUS

## 2017-01-08 MED ORDER — CIPROFLOXACIN HCL 500 MG PO TABS: 500.0000 mg | ORAL_TABLET | Freq: Two times a day (BID) | ORAL | 0 refills | Status: DC

## 2017-01-08 MED ORDER — METRONIDAZOLE 500 MG PO TABS: 500.0000 mg | ORAL_TABLET | Freq: Three times a day (TID) | ORAL | 0 refills | Status: DC

## 2017-01-08 NOTE — Telephone Encounter (Signed)
Notes recorded by Harrison Mons, PA-C on 01/08/2017 at 6:24 PM EDT Please call this patient. The CT scan shows possible diverticulitis or bladder infection. This may explain the bloody drainage she has had. Needs: Cipro 500 mg BID x 10 days (#20, no RF) AND Metronidazole 500 mg TID X 10 days (#30, no RF)  Pt advised and will start antibiotic x 2  And f/u with surgeon

## 2017-01-15 ENCOUNTER — Encounter: Payer: Self-pay | Admitting: Radiology

## 2017-01-15 NOTE — Telephone Encounter (Signed)
Error

## 2017-01-16 DIAGNOSIS — D045 Carcinoma in situ of skin of trunk: Secondary | ICD-10-CM | POA: Diagnosis not present

## 2017-01-23 ENCOUNTER — Encounter: Payer: Self-pay | Admitting: Gynecology

## 2017-01-30 ENCOUNTER — Other Ambulatory Visit: Payer: Self-pay | Admitting: Physician Assistant

## 2017-01-30 DIAGNOSIS — N823 Fistula of vagina to large intestine: Secondary | ICD-10-CM

## 2017-01-30 NOTE — Progress Notes (Unsigned)
Referral to surgeon pf patient's choice.  Orders Placed This Encounter  Procedures  . Ambulatory referral to General Surgery    Referral Priority:   Routine    Referral Type:   Surgical    Referral Reason:   Specialty Services Required    Referred to Provider:   Demetrius Revel, MD    Requested Specialty:   Surgery    Number of Visits Requested:   1

## 2017-02-27 DIAGNOSIS — Z86010 Personal history of colonic polyps: Secondary | ICD-10-CM | POA: Diagnosis not present

## 2017-02-27 DIAGNOSIS — K219 Gastro-esophageal reflux disease without esophagitis: Secondary | ICD-10-CM | POA: Insufficient documentation

## 2017-02-27 DIAGNOSIS — K5792 Diverticulitis of intestine, part unspecified, without perforation or abscess without bleeding: Secondary | ICD-10-CM | POA: Diagnosis not present

## 2017-02-27 DIAGNOSIS — N824 Other female intestinal-genital tract fistulae: Secondary | ICD-10-CM | POA: Diagnosis not present

## 2017-03-30 ENCOUNTER — Other Ambulatory Visit: Payer: Self-pay | Admitting: Physician Assistant

## 2017-03-30 DIAGNOSIS — I872 Venous insufficiency (chronic) (peripheral): Secondary | ICD-10-CM

## 2017-04-17 DIAGNOSIS — K5792 Diverticulitis of intestine, part unspecified, without perforation or abscess without bleeding: Secondary | ICD-10-CM | POA: Diagnosis not present

## 2017-04-17 DIAGNOSIS — K429 Umbilical hernia without obstruction or gangrene: Secondary | ICD-10-CM | POA: Diagnosis not present

## 2017-04-17 DIAGNOSIS — M479 Spondylosis, unspecified: Secondary | ICD-10-CM | POA: Diagnosis not present

## 2017-04-17 DIAGNOSIS — Z86010 Personal history of colonic polyps: Secondary | ICD-10-CM | POA: Diagnosis not present

## 2017-04-17 DIAGNOSIS — N824 Other female intestinal-genital tract fistulae: Secondary | ICD-10-CM | POA: Diagnosis not present

## 2017-04-17 DIAGNOSIS — K449 Diaphragmatic hernia without obstruction or gangrene: Secondary | ICD-10-CM | POA: Diagnosis not present

## 2017-04-17 DIAGNOSIS — N281 Cyst of kidney, acquired: Secondary | ICD-10-CM | POA: Diagnosis not present

## 2017-04-17 DIAGNOSIS — K5730 Diverticulosis of large intestine without perforation or abscess without bleeding: Secondary | ICD-10-CM | POA: Diagnosis not present

## 2017-04-24 ENCOUNTER — Ambulatory Visit (INDEPENDENT_AMBULATORY_CARE_PROVIDER_SITE_OTHER): Payer: Medicare HMO | Admitting: Physician Assistant

## 2017-04-24 DIAGNOSIS — E538 Deficiency of other specified B group vitamins: Secondary | ICD-10-CM

## 2017-04-24 NOTE — Progress Notes (Signed)
Here for scheduled injection only. Philis Fendt, MS, PA-C 2:24 PM, 04/24/2017

## 2017-04-29 DIAGNOSIS — N824 Other female intestinal-genital tract fistulae: Secondary | ICD-10-CM | POA: Diagnosis not present

## 2017-04-29 DIAGNOSIS — K5792 Diverticulitis of intestine, part unspecified, without perforation or abscess without bleeding: Secondary | ICD-10-CM | POA: Diagnosis not present

## 2017-05-22 ENCOUNTER — Ambulatory Visit (INDEPENDENT_AMBULATORY_CARE_PROVIDER_SITE_OTHER): Payer: Medicare HMO | Admitting: Family Medicine

## 2017-05-22 ENCOUNTER — Encounter: Payer: Self-pay | Admitting: Family Medicine

## 2017-05-22 ENCOUNTER — Other Ambulatory Visit: Payer: Self-pay | Admitting: Physician Assistant

## 2017-05-22 VITALS — BP 120/75 | HR 81 | Temp 98.1°F | Resp 17 | Ht 65.0 in | Wt 195.2 lb

## 2017-05-22 DIAGNOSIS — I872 Venous insufficiency (chronic) (peripheral): Secondary | ICD-10-CM

## 2017-05-22 DIAGNOSIS — L309 Dermatitis, unspecified: Secondary | ICD-10-CM

## 2017-05-22 MED ORDER — TRIAMCINOLONE ACETONIDE 0.1 % EX CREA: TOPICAL_CREAM | Freq: Two times a day (BID) | CUTANEOUS | 0 refills | Status: DC

## 2017-05-22 NOTE — Patient Instructions (Addendum)
IF you received an x-ray today, you will receive an invoice from [facility]. Please contact [facility] at [phone] with questions or concerns regarding your invoice.   IF you received labwork today, you will receive an invoice from [place]. Please contact [facility] at [phone] with questions or concerns regarding your invoice.   Our billing staff will not be able to assist you with questions regarding bills from these companies.  You will be contacted with the lab results as soon as they are available. The fastest way to get your results is to activate your My Chart account. Instructions are located on the last page of this paperwork. If you have not heard from [name] regarding the results in 2 weeks, please contact this office.     Rash A rash is a change in the color of the skin. A rash can also change the way your skin feels. There are many different conditions and factors that can cause a rash. Follow these instructions at home: Pay attention to any changes in your symptoms. Follow these instructions to help with your condition: Medicine Take or apply over-the-counter and prescription medicines only as told by your health care provider. These may include:  Corticosteroid cream.  Anti-itch lotions.  Oral antihistamines.  Skin Care  Apply cool compresses to the affected areas.  Try taking a bath with: ? Epsom salts. Follow the instructions on the packaging. You can get these at your local pharmacy or grocery store. ? Baking soda. Pour a small amount into the bath as told by your health care provider. ? Colloidal oatmeal. Follow the instructions on the packaging. You can get this at your local pharmacy or grocery store.  Try applying baking soda paste to your skin. Stir water into baking soda until it reaches a paste-like consistency.  Do not scratch or rub your skin.  Avoid covering the rash. Make sure the rash is exposed to air as much as  possible. General instructions  Avoid hot showers or baths, which can make itching worse. A cold shower may help.  Avoid scented soaps, detergents, and perfumes. Use gentle soaps, detergents, perfumes, and other cosmetic products.  Avoid any substance that causes your rash. Keep a journal to help track what causes your rash. Write down: ? What you eat. ? What cosmetic products you use. ? What you drink. ? What you wear. This includes jewelry.  Keep all follow-up visits as told by your health care provider. This is important. Contact a health care provider if:  You sweat at night.  You lose weight.  You urinate more than normal.  You feel weak.  You vomit.  Your skin or the whites of your eyes look yellow (jaundice).  Your skin: ? Tingles. ? Is numb.  Your rash: ? Does not go away after several days. ? Gets worse.  You are: ? Unusually thirsty. ? More tired than normal.  You have: ? New symptoms. ? Pain in your abdomen. ? A fever. ? Diarrhea. Get help right away if:  You develop a rash that covers all or most of your body. The rash may or may not be painful.  You develop blisters that: ? Are on top of the rash. ? Grow larger or grow together. ? Are painful. ? Are inside your nose or mouth.  You develop a rash that: ? Looks like purple pinprick-sized spots all over your body. ? Has a "bull's eye" or looks like a target. ? Is not  related to sun exposure, is red and painful, and causes your skin to peel. This information is not intended to replace advice given to you by your health care provider. Make sure you discuss any questions you have with your health care provider. Document Released: 08/17/2002 Document Revised: 01/31/2016 Document Reviewed: 01/12/2015 Elsevier Interactive Patient Education  2017 [employer].

## 2017-05-22 NOTE — Progress Notes (Signed)
Chief Complaint  Patient presents with  . Facial Swelling    onset: Sunday, ? bug bite to face, bothered by it on Monday, swelling on tuesday and used ice packs all yesterday, rash on right leg and swelling.  Bothering her eye-swelling    HPI   Pt reports that on Sunday, 4 days ago, a bug bit her in the face and she started having bilateral redness and burning She reports that she can feel swelling around her eyes She denies blisters or water bumps She states that her cheeks are firey red She applied unscented vaseline She reports that she has been itching on her ankles She has been using baby diaper rash medication on the right ankle  Past Medical History:  Diagnosis Date  . Diverticulosis   . GERD (gastroesophageal reflux disease)   . HTN (hypertension)     Current Outpatient Prescriptions  Medication Sig Dispense Refill  . acidophilus (RISAQUAD) CAPS capsule Take 1 capsule by mouth daily.     . cholecalciferol (VITAMIN D) 1000 UNITS tablet Take 1,000 Units by mouth daily.    . ciprofloxacin (CIPRO) 500 MG tablet Take 1 tablet (500 mg total) by mouth 2 (two) times daily. 20 tablet 0  . clotrimazole-betamethasone (LOTRISONE) cream Apply 1 application topically 2 (two) times daily. 90 g 0  . fish oil-omega-3 fatty acids 1000 MG capsule Take 1 g by mouth daily.     . furosemide (LASIX) 20 MG tablet TAKE 1 TABLET (20 MG TOTAL) BY MOUTH DAILY. 30 tablet 0  . Ginkgo Biloba 40 MG TABS Take 1 tablet by mouth daily.     . hydrOXYzine (ATARAX/VISTARIL) 25 MG tablet TAKE 1 OR 2 TABLETS BY MOUTH EVERY 6 HOURS AS NEEDED FOR ITCHING 40 tablet 4  . metroNIDAZOLE (FLAGYL) 500 MG tablet Take 1 tablet (500 mg total) by mouth 3 (three) times daily. 30 tablet 0  . Multiple Vitamin (MULTIVITAMIN) tablet Take 1 tablet by mouth daily.    Marland Kitchen omeprazole (PRILOSEC) 40 MG capsule Take 40 mg by mouth daily.    . promethazine (PHENERGAN) 12.5 MG tablet Take 1 tablet (12.5 mg total) by mouth every 6 (six)  hours as needed for nausea or vomiting. 30 tablet 0  . triamcinolone cream (KENALOG) 0.1 % Apply topically 2 (two) times daily. For no more than 2 weeks 80 g 0  . vitamin C (ASCORBIC ACID) 500 MG tablet Take 500 mg by mouth daily. Takes only during winter months    . amoxicillin (AMOXIL) 875 MG tablet Take 1 tablet (875 mg total) by mouth 2 (two) times daily. (Patient not taking: Reported on 05/22/2017) 20 tablet 0   Current Facility-Administered Medications  Medication Dose Route Frequency Provider Last Rate Last Dose  . cyanocobalamin ((VITAMIN B-12)) injection 1,000 mcg  1,000 mcg Intramuscular Q30 days Scot Jun, FNP   1,000 mcg at 04/24/17 1503    Allergies:  Allergies  Allergen Reactions  . Valium [Diazepam] Other (See Comments)    Totally different personality    Past Surgical History:  Procedure Laterality Date  . ABDOMINAL HYSTERECTOMY     TAH BSO  . FRACTURE SURGERY    . gall bladder removed      Social History   Social History  . Marital status: Widowed    Spouse name: N/A  . Number of children: N/A  . Years of education: N/A   Social History Main Topics  . Smoking status: Never Smoker  . Smokeless tobacco: Never Used  .  Alcohol use 1.2 oz/week    2 Standard drinks or equivalent per week  . Drug use: Unknown  . Sexual activity: No   Other Topics Concern  . None   Social History Narrative  . None    ROS  Objective: Vitals:   05/22/17 1639  BP: 120/75  Pulse: 81  Resp: 17  Temp: 98.1 F (36.7 C)  TempSrc: Oral  SpO2: 93%  Weight: 195 lb 3.2 oz (88.5 kg)  Height: 5\' 5"  (1.651 m)    Physical Exam  Constitutional: She appears well-developed and well-nourished.  Pulmonary/Chest: Effort normal.  Skin:  Erythema on left and right face, no pustules or vesicle.     Assessment and Plan Nadene was seen today for facial swelling.  Diagnoses and all orders for this visit:  Dermatitis of face   Stasis dermatitis of both legs -      triamcinolone cream (KENALOG) 0.1 %; Apply topically 2 (two) times daily. For no more than 2 weeks     Zoe A Stallings

## 2017-05-23 NOTE — Telephone Encounter (Signed)
REFILL REQUEST OF RTRIAMCINOLONE CREAM 0.1 % APPROVED FOR [employer] AND NO REFILLS.

## 2017-07-24 ENCOUNTER — Other Ambulatory Visit: Payer: Self-pay | Admitting: Family Medicine

## 2017-07-24 DIAGNOSIS — I872 Venous insufficiency (chronic) (peripheral): Secondary | ICD-10-CM

## 2017-07-24 NOTE — Telephone Encounter (Signed)
LOV 05/22/17 with Dr. Nolon Rod for Dermatitis of face / PCP is Dr. Daphane Shepherd / Patient is requesting refill of:  triamcinolone cream (KENALOG) 0.1 % / Per prescription and office note on 05/22/17 it was only for 2 weeks.   Disp Refills Start End   triamcinolone cream (KENALOG) 0.1 % 80 g 0 05/22/2017    Sig - Route: Apply topically 2 (two) times daily. For no more than 2 weeks - Topical    / [facility] [address] is the preferred [facility] if prescription can be sent. / Thanks

## 2017-07-25 ENCOUNTER — Telehealth: Payer: Self-pay | Admitting: Family Medicine

## 2017-07-25 NOTE — Telephone Encounter (Signed)
Copied from [place] [id number]. Topic: Inquiry >> Jul 25, 2017  2:11 PM Ellen Johnson, NT wrote: Reason for CRM: pt would like for Ellen Johnson to refill her TRI cream if possible. She contacted pharmacy and did not get a ok. Pt would like a call back

## 2017-07-25 NOTE — Telephone Encounter (Signed)
(  triamcinolone cream (KENALOG) 0.1 %; Apply topically 2 (two) times daily. For no more than 2 weeks)  copied from [place]

## 2017-07-26 NOTE — Telephone Encounter (Signed)
Called pt [facility] - unable to refill Kenalog cream.  Original rx by Dr. Nolon Rod.  Notes confirm she only wanted pt to use 2 weeks.     Advised pt if continued symptoms or recurrence to call office and make appt with Dr. Nolon Rod.

## 2017-08-09 ENCOUNTER — Ambulatory Visit: Payer: Medicare HMO

## 2017-08-14 ENCOUNTER — Ambulatory Visit (INDEPENDENT_AMBULATORY_CARE_PROVIDER_SITE_OTHER): Payer: Medicare HMO | Admitting: Physician Assistant

## 2017-08-14 ENCOUNTER — Ambulatory Visit (INDEPENDENT_AMBULATORY_CARE_PROVIDER_SITE_OTHER): Payer: Medicare HMO

## 2017-08-14 VITALS — BP 130/72 | HR 96 | Ht 65.0 in | Wt 197.5 lb

## 2017-08-14 DIAGNOSIS — Z0000 Encounter for general adult medical examination without abnormal findings: Secondary | ICD-10-CM | POA: Diagnosis not present

## 2017-08-14 DIAGNOSIS — E538 Deficiency of other specified B group vitamins: Secondary | ICD-10-CM

## 2017-08-14 DIAGNOSIS — E2839 Other primary ovarian failure: Secondary | ICD-10-CM

## 2017-08-14 NOTE — Progress Notes (Signed)
Subjective:   Ellen Johnson is a 81 y.o. female who presents for an Initial Medicare Annual Wellness Visit.  Review of Systems    N/A  Cardiac Risk Factors include: advanced age (>58men, >65 women);hypertension;obesity (BMI >30kg/m2)     Objective:    Today's Vitals   08/14/17 1436  BP: 130/72  Pulse: 96  SpO2: 95%  Weight: 197 lb 8 oz (89.6 kg)  Height: 5\' 5"  (1.651 m)   Body mass index is 32.87 kg/m.  Advanced Directives 08/14/2017 07/17/2016 06/18/2016  Does Patient Have a Medical Advance Directive? Yes No No  Type of Paramedic of [place];Living will - -  Copy of [place] in Chart? No - copy requested - -  Would patient like information on creating a medical advance directive? - No - patient declined information No - patient declined information    Current Medications (verified) Outpatient Encounter Medications as of 08/14/2017  Medication Sig  . acidophilus (RISAQUAD) CAPS capsule Take 1 capsule by mouth daily.   . cholecalciferol (VITAMIN D) 1000 UNITS tablet Take 1,000 Units by mouth daily.  . clotrimazole-betamethasone (LOTRISONE) cream Apply 1 application topically 2 (two) times daily.  . fish oil-omega-3 fatty acids 1000 MG capsule Take 1 g by mouth daily.   . furosemide (LASIX) 20 MG tablet TAKE 1 TABLET (20 MG TOTAL) BY MOUTH DAILY.  . Ginkgo Biloba 40 MG TABS Take 1 tablet by mouth daily.   . hydrOXYzine (ATARAX/VISTARIL) 25 MG tablet TAKE 1 OR 2 TABLETS BY MOUTH EVERY 6 HOURS AS NEEDED FOR ITCHING  . metroNIDAZOLE (FLAGYL) 500 MG tablet Take 1 tablet (500 mg total) by mouth 3 (three) times daily.  . Multiple Vitamin (MULTIVITAMIN) tablet Take 1 tablet by mouth daily.  Marland Kitchen omeprazole (PRILOSEC) 40 MG capsule Take 40 mg by mouth daily.  . promethazine (PHENERGAN) 12.5 MG tablet Take 1 tablet (12.5 mg total) by mouth every 6 (six) hours as needed for nausea or vomiting.  . triamcinolone cream (KENALOG) 0.1 % Apply  topically 2 (two) times daily. For no more than 2 weeks  . triamcinolone cream (KENALOG) 0.1 % APPLY TO AFFECTED AREA TWICE A DAY  . vitamin C (ASCORBIC ACID) 500 MG tablet Take 500 mg by mouth daily. Takes only during winter months  . [DISCONTINUED] ciprofloxacin (CIPRO) 500 MG tablet Take 1 tablet (500 mg total) by mouth 2 (two) times daily.  . [DISCONTINUED] amoxicillin (AMOXIL) 875 MG tablet Take 1 tablet (875 mg total) by mouth 2 (two) times daily. (Patient not taking: Reported on 05/22/2017)   Facility-Administered Encounter Medications as of 08/14/2017  Medication  . cyanocobalamin ((VITAMIN B-12)) injection 1,000 mcg    Allergies (verified) Valium [diazepam]   History: Past Medical History:  Diagnosis Date  . Diverticulosis   . GERD (gastroesophageal reflux disease)   . HTN (hypertension)    Past Surgical History:  Procedure Laterality Date  . ABDOMINAL HYSTERECTOMY     TAH BSO  . FRACTURE SURGERY    . gall bladder removed     Family History  Problem Relation Age of Onset  . Stroke Mother   . Cancer Father        Colon  . Stroke Maternal Grandmother   . Breast cancer Paternal Grandmother    Social History   Socioeconomic History  . Marital status: Widowed    Spouse name: None  . Number of children: 7  . Years of education: None  . Highest education  level: Associate degree: academic program  Social Needs  . Financial resource strain: Not hard at all  . Food insecurity - worry: Never true  . Food insecurity - inability: Never true  . Transportation needs - medical: No  . Transportation needs - non-medical: No  Occupational History  . Occupation: Therapist, sports  Tobacco Use  . Smoking status: Never Smoker  . Smokeless tobacco: Never Used  Substance and Sexual Activity  . Alcohol use: Yes    Comment: 4 oz weekly of wine  . Drug use: No  . Sexual activity: No  Other Topics Concern  . None  Social History Narrative  . None    Tobacco Counseling Counseling given:  Not Answered   Clinical Intake:  Pre-visit preparation completed: Yes  Pain : No/denies pain     Nutritional Status: BMI > 30  Obese Nutritional Risks: Nausea/ vomitting/ diarrhea(Patient has nausea sometimes and takes medicine for it.) Diabetes: No  Activities of Daily Living: Independent Ambulation: Independent with device- listed below Home Assistive Devices/Equipment: Eyeglasses Medication Administration: Independent Home Management: Independent  Barriers to Care Management & Learning: None  Do you feel unsafe in your current relationship?: No Do you feel physically threatened by others?: No Anyone hurting you at home, work, or school?: No Unable to ask?: No  How often do you need to have someone help you when you read instructions, pamphlets, or other written materials from your doctor or pharmacy?: 1 - Never What is the last grade level you completed in school?: 3 years college  Interpreter Needed?: No  Information entered by :: Andrez Grime, LPN   Activities of Daily Living In your present state of health, do you have any difficulty performing the following activities: 08/14/2017  Hearing? Y  Comment Family is telling patient she needs hearing test  Vision? N  Difficulty concentrating or making decisions? N  Walking or climbing stairs? N  Dressing or bathing? N  Doing errands, shopping? N  Preparing Food and eating ? N  Using the Toilet? N  In the past six months, have you accidently leaked urine? Y  Comment Patient has to wear a pad daily   Do you have problems with loss of bowel control? N  Managing your Medications? N  Managing your Finances? N  Housekeeping or managing your Housekeeping? N  Some recent data might be hidden    Timed Get Up and Go Performed yes, passed, completed within 30 minutes  Immunizations and Health Maintenance Immunization History  Administered Date(s) Administered  . Influenza Split 06/27/2012  . Influenza,inj,Quad  PF,6+ Mos 07/23/2013, 05/27/2014, 06/02/2015, 05/10/2016  . Pneumococcal Conjugate-13 09/30/2014  . Td 05/11/2005   Health Maintenance Due  Topic Date Due  . DEXA SCAN  08/30/2003    Patient Care Team: Harrison Mons, PA-C as PCP - General (Family Medicine) Allyn Kenner, MD as Consulting Physician (Dermatology) Carol Ada, MD as Consulting Physician (Gastroenterology) Demetrius Revel, MD (Surgery)  Indicate any recent Medical Services you may have received from other than [facility] providers in the past year (date may be approximate).     Assessment:   This is a routine wellness examination for Ellen Johnson.   Hearing/Vision screen Hearing Screening Comments: Patient has never had a hearing exam.  Vision Screening Comments: Patient sees her eye doctor once yearly for eye exams.  Dietary issues and exercise activities discussed: Current Exercise Habits: Structured exercise class, Time (Minutes): > 60, Frequency (Times/Week): 2, Weekly Exercise (Minutes/Week): 0, Intensity: Mild, Exercise limited by: None  identified  Goals    . DIET - EAT MORE FRUITS AND VEGETABLES     Patient states that she wants to try to start eating a better diet and hopefully start losing weight.       Depression Screen PHQ 2/9 Scores 08/14/2017 05/22/2017 12/19/2016 09/12/2016 07/06/2016 06/06/2016 01/31/2016  PHQ - 2 Score 1 0 0 0 0 0 1    Fall Risk Fall Risk  08/14/2017 05/22/2017 12/19/2016 09/12/2016 07/06/2016  Falls in the past year? No No No No No    Is the patient's home free of loose throw rugs in walkways, pet beds, electrical cords, etc?   yes      Grab bars in the bathroom? no      Handrails on the stairs?   yes      Adequate lighting?   yes  Cognitive Function:     6CIT Screen 08/14/2017  What Year? 0 points  What month? 0 points  What time? 0 points  Count back from 20 0 points  Months in reverse 0 points  Repeat phrase 4 points  Total Score 4    Screening Tests Health Maintenance  Topic  Date Due  . DEXA SCAN  08/30/2003  . TETANUS/TDAP  08/14/2018 (Originally 05/12/2015)  . INFLUENZA VACCINE  Completed  . PNA vac Low Risk Adult  Completed    Cancer Screenings: Lung:  Low Dose CT Chest recommended if Age 1-80 years, 30 pack-year currently smoking OR have quit w/in 15years. Patient does not qualify. Breast:  Up to date on Mammogram? Yes, completed 11/09/2015 Up to date of Bone Density/Dexa? No, [facility] to [facility] in [place]. [facility] will contact patient to schedule. Colorectal: up to date, completed 03/30/2014  Additional Screenings:  Hepatitis B/HIV/Syphillis: not indicated Hepatitis C Screening: not indicated     Plan:   I have personally reviewed and noted the following in the patient's chart:   . Medical and social history . Use of alcohol, tobacco or illicit drugs  . Current medications and supplements . Functional ability and status . Nutritional status . Physical activity . Advanced directives . List of other physicians . Hospitalizations, surgeries, and ER visits in previous 12 months . Vitals . Screenings to include cognitive, depression, and falls . Referrals and appointments  In addition, I have reviewed and discussed with patient certain preventive protocols, quality metrics, and best practice recommendations. A written personalized care plan for preventive services as well as general preventive health recommendations were provided to patient.   1. Estrogen deficiency - DEXAScan  2. Encounter for Medicare annual wellness exam   [place], [place], LPN   13/10/4399

## 2017-08-14 NOTE — Patient Instructions (Addendum)
Ellen Johnson , Thank you for taking time to come for your Medicare Wellness Visit. I appreciate your ongoing commitment to your health goals. Please review the following plan we discussed and let me know if I can assist you in the future.   Screening recommendations/referrals: Colonoscopy: up to date, you will have a repeat colonoscopy in January  Mammogram: up to date Bone Density: Referral ordered. [facility] will contact you concerning your appointment info Recommended yearly ophthalmology/optometry visit for glaucoma screening and checkup Recommended yearly dental visit for hygiene and checkup  Vaccinations: Influenza vaccine: up to date   Pneumococcal vaccine: up to date Tdap vaccine: declined due to insurance Shingles vaccine: Check with your pharmacy about receiving this vaccine    Advance directive:  Please bring a copy of your POA (Power of [place]) and/or Living Will to your next appointment.   Conditions/risks identified: Try to start eating a better diet and hopefully start losing weight  Next appointment: schedule follow up with PCP, 1 year for AWV   Preventive Care 59 Years and Older, Female Preventive care refers to lifestyle choices and visits with your health care provider that can promote health and wellness. What does preventive care include?  A yearly physical exam. This is also called an annual well check.  Dental exams once or twice a year.  Routine eye exams. Ask your health care provider how often you should have your eyes checked.  Personal lifestyle choices, including:  Daily care of your teeth and gums.  Regular physical activity.  Eating a healthy diet.  Avoiding tobacco and drug use.  Limiting alcohol use.  Practicing safe sex.  Taking low-dose aspirin every day.  Taking vitamin and mineral supplements as recommended by your health care provider. What happens during an annual well check? The services and screenings done by your health  care provider during your annual well check will depend on your age, overall health, lifestyle risk factors, and family history of disease. Counseling  Your health care provider may ask you questions about your:  Alcohol use.  Tobacco use.  Drug use.  Emotional well-being.  Home and relationship well-being.  Sexual activity.  Eating habits.  History of falls.  Memory and ability to understand (cognition).  Work and work Statistician.  Reproductive health. Screening  You may have the following tests or measurements:  Height, weight, and BMI.  Blood pressure.  Lipid and cholesterol levels. These may be checked every 5 years, or more frequently if you are over 72 years old.  Skin check.  Lung cancer screening. You may have this screening every year starting at age 79 if you have a 30-pack-year history of smoking and currently smoke or have quit within the past 15 years.  Fecal occult blood test (FOBT) of the stool. You may have this test every year starting at age 79.  Flexible sigmoidoscopy or colonoscopy. You may have a sigmoidoscopy every 5 years or a colonoscopy every 10 years starting at age 79.  Hepatitis C blood test.  Hepatitis B blood test.  Sexually transmitted disease (STD) testing.  Diabetes screening. This is done by checking your blood sugar (glucose) after you have not eaten for a while (fasting). You may have this done every 1-3 years.  Bone density scan. This is done to screen for osteoporosis. You may have this done starting at age 79.  Mammogram. This may be done every 1-2 years. Talk to your health care provider about how often you should have regular  mammograms. Talk with your health care provider about your test results, treatment options, and if necessary, the need for more tests. Vaccines  Your health care provider may recommend certain vaccines, such as:  Influenza vaccine. This is recommended every year.  Tetanus, diphtheria, and  acellular pertussis (Tdap, Td) vaccine. You may need a Td booster every 10 years.  Zoster vaccine. You may need this after age 79.  Pneumococcal 13-valent conjugate (PCV13) vaccine. One dose is recommended after age 79.  Pneumococcal polysaccharide (PPSV23) vaccine. One dose is recommended after age 79. Talk to your health care provider about which screenings and vaccines you need and how often you need them. This information is not intended to replace advice given to you by your health care provider. Make sure you discuss any questions you have with your health care provider. Document Released: 09/23/2015 Document Revised: 05/16/2016 Document Reviewed: 06/28/2015 Elsevier Interactive Patient Education  2017 [place] Prevention in the Home Falls can cause injuries. They can happen to people of all ages. There are many things you can do to make your home safe and to help prevent falls. What can I do on the outside of my home?  Regularly fix the edges of walkways and driveways and fix any cracks.  Remove anything that might make you trip as you walk through a door, such as a raised step or threshold.  Trim any bushes or trees on the path to your home.  Use bright outdoor lighting.  Clear any walking paths of anything that might make someone trip, such as rocks or tools.  Regularly check to see if handrails are loose or broken. Make sure that both sides of any steps have handrails.  Any raised decks and porches should have guardrails on the edges.  Have any leaves, snow, or ice cleared regularly.  Use sand or salt on walking paths during winter.  Clean up any spills in your garage right away. This includes oil or grease spills. What can I do in the bathroom?  Use night lights.  Install grab bars by the toilet and in the tub and shower. Do not use towel bars as grab bars.  Use non-skid mats or decals in the tub or shower.  If you need to sit down in the shower, use  a plastic, non-slip stool.  Keep the floor dry. Clean up any water that spills on the floor as soon as it happens.  Remove soap buildup in the tub or shower regularly.  Attach bath mats securely with double-sided non-slip rug tape.  Do not have throw rugs and other things on the floor that can make you trip. What can I do in the bedroom?  Use night lights.  Make sure that you have a light by your bed that is easy to reach.  Do not use any sheets or blankets that are too big for your bed. They should not hang down onto the floor.  Have a firm chair that has side arms. You can use this for support while you get dressed.  Do not have throw rugs and other things on the floor that can make you trip. What can I do in the kitchen?  Clean up any spills right away.  Avoid walking on wet floors.  Keep items that you use a lot in easy-to-reach places.  If you need to reach something above you, use a strong step stool that has a grab bar.  Keep electrical cords out of the way.  Do not use floor polish or wax that makes floors slippery. If you must use wax, use non-skid floor wax.  Do not have throw rugs and other things on the floor that can make you trip. What can I do with my stairs?  Do not leave any items on the stairs.  Make sure that there are handrails on both sides of the stairs and use them. Fix handrails that are broken or loose. Make sure that handrails are as long as the stairways.  Check any carpeting to make sure that it is firmly attached to the stairs. Fix any carpet that is loose or worn.  Avoid having throw rugs at the top or bottom of the stairs. If you do have throw rugs, attach them to the floor with carpet tape.  Make sure that you have a light switch at the top of the stairs and the bottom of the stairs. If you do not have them, ask someone to add them for you. What else can I do to help prevent falls?  Wear shoes that:  Do not have high heels.  Have  rubber bottoms.  Are comfortable and fit you well.  Are closed at the toe. Do not wear sandals.  If you use a stepladder:  Make sure that it is fully opened. Do not climb a closed stepladder.  Make sure that both sides of the stepladder are locked into place.  Ask someone to hold it for you, if possible.  Clearly mark and make sure that you can see:  Any grab bars or handrails.  First and last steps.  Where the edge of each step is.  Use tools that help you move around (mobility aids) if they are needed. These include:  Canes.  Walkers.  Scooters.  Crutches.  Turn on the lights when you go into a dark area. Replace any light bulbs as soon as they burn out.  Set up your furniture so you have a clear path. Avoid moving your furniture around.  If any of your floors are uneven, fix them.  If there are any pets around you, be aware of where they are.  Review your medicines with your doctor. Some medicines can make you feel dizzy. This can increase your chance of falling. Ask your doctor what other things that you can do to help prevent falls. This information is not intended to replace advice given to you by your health care provider. Make sure you discuss any questions you have with your health care provider. Document Released: 06/23/2009 Document Revised: 02/02/2016 Document Reviewed: 10/01/2014 Elsevier Interactive Patient Education  2017 [employer].

## 2017-09-10 NOTE — Progress Notes (Signed)
b12 injection only

## 2017-09-25 DIAGNOSIS — N824 Other female intestinal-genital tract fistulae: Secondary | ICD-10-CM | POA: Diagnosis not present

## 2017-09-25 DIAGNOSIS — K5792 Diverticulitis of intestine, part unspecified, without perforation or abscess without bleeding: Secondary | ICD-10-CM | POA: Diagnosis not present

## 2017-10-19 IMAGING — CT CT ABD-PELV W/ CM
2 of 5 series · 15 of 46 positions shown, 17 images · IV contrast (Omni 300)
Comparison: 02/21/2016

CLINICAL DATA: Stool coming from vagina. History of diverticulitis
with fistula.

EXAM:
CT ABDOMEN AND PELVIS WITH CONTRAST
TECHNIQUE: Multidetector CT imaging of the abdomen and pelvis was performed
using the standard protocol following bolus administration of
intravenous contrast.
CONTRAST:  100 ml 35KYLV-YJJ IOPAMIDOL (35KYLV-YJJ) INJECTION 61%

[Series 2: a/p w/ 5mm · axial · 0.82mm/px · z∈[+657,+1142]mm · 12 of 109 slices shown, 14 images]
[im 6/109  soft-tissue]
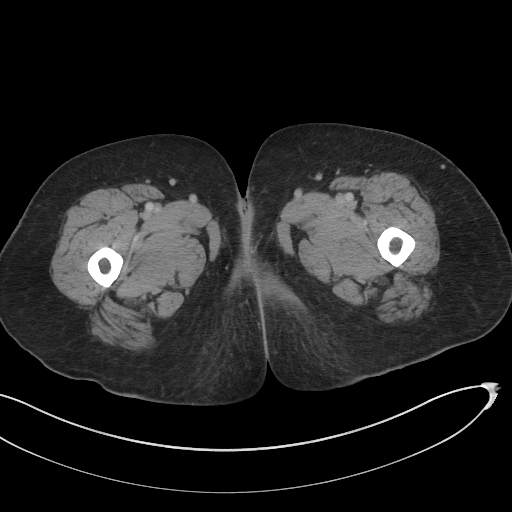
[im 6/109  bone]
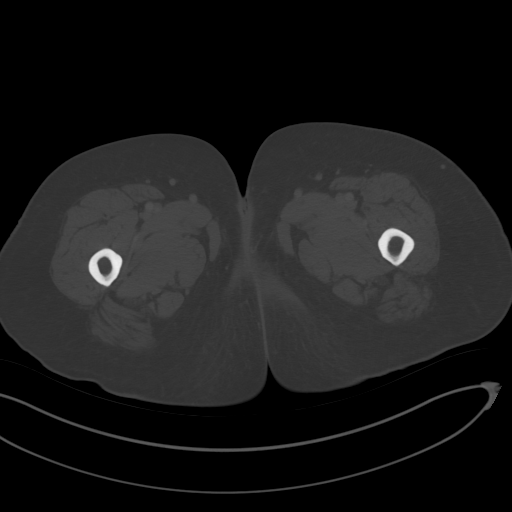
[im 17/109  soft-tissue]
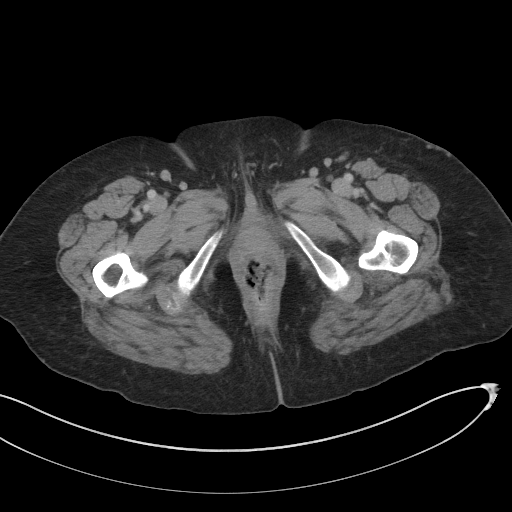
[im 22/109  soft-tissue]
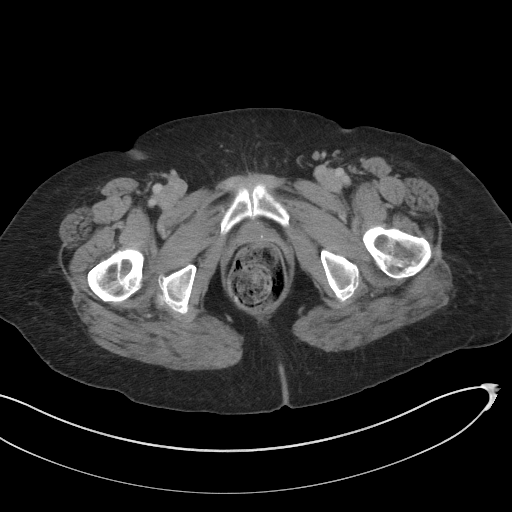
[im 33/109  soft-tissue]
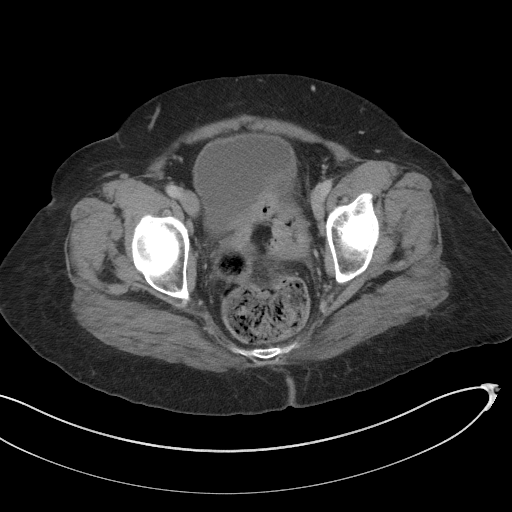
[im 44/109  soft-tissue]
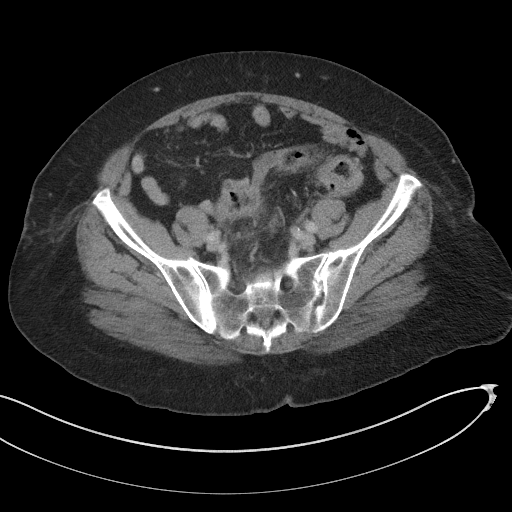
[im 49/109  soft-tissue]
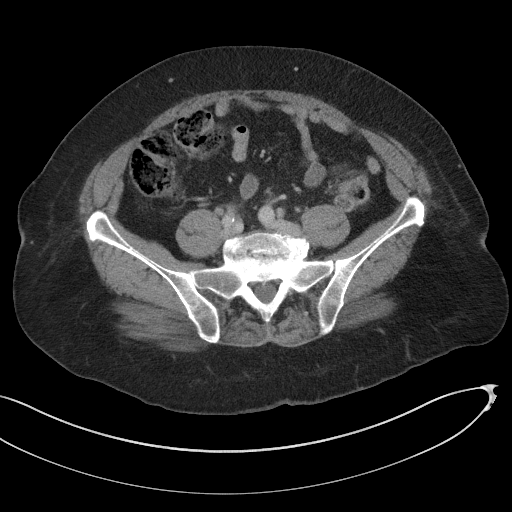
[im 60/109  soft-tissue]
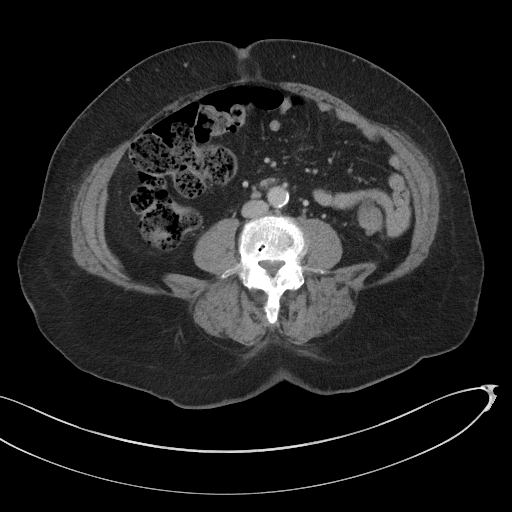
[im 65/109  soft-tissue]
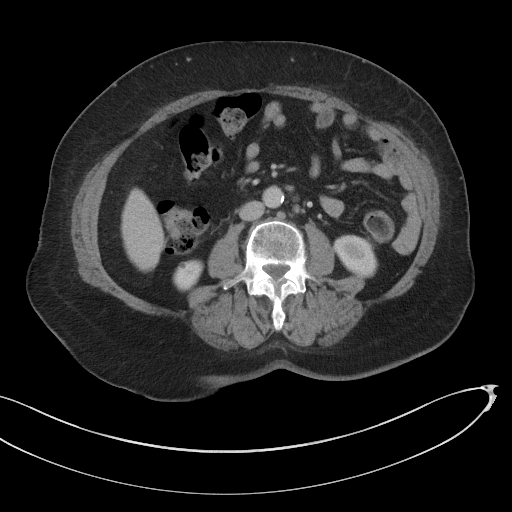
[im 76/109  soft-tissue]
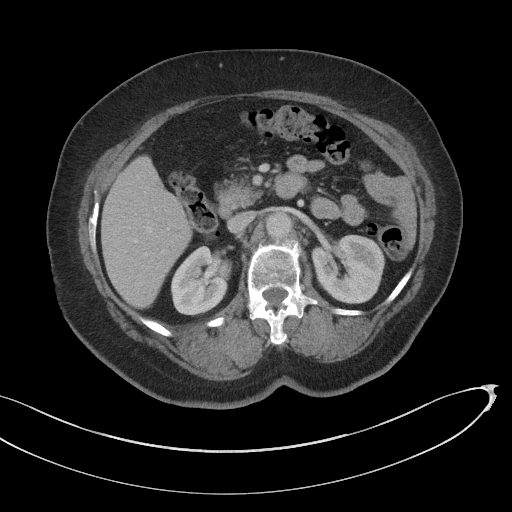
[im 76/109  bone]
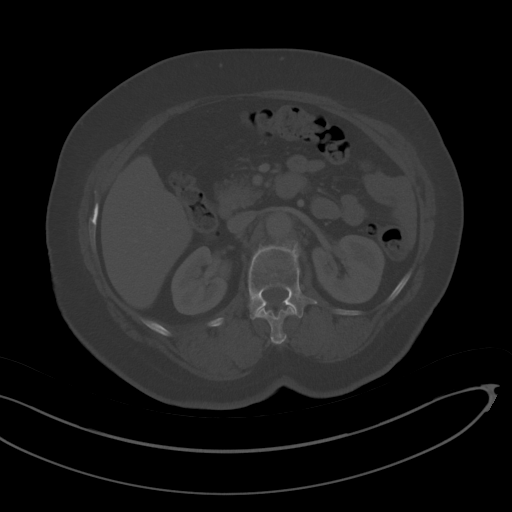
[im 87/109  soft-tissue]
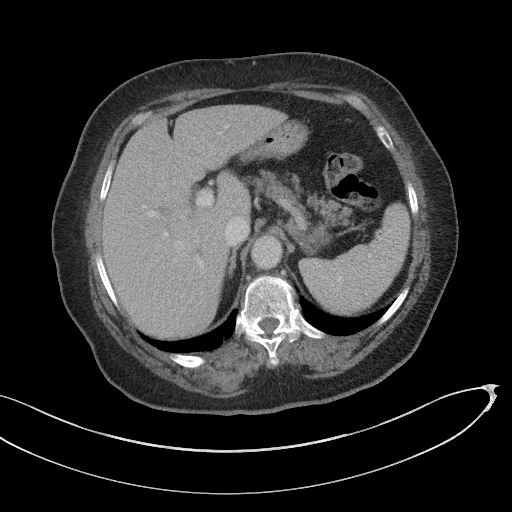
[im 92/109  soft-tissue]
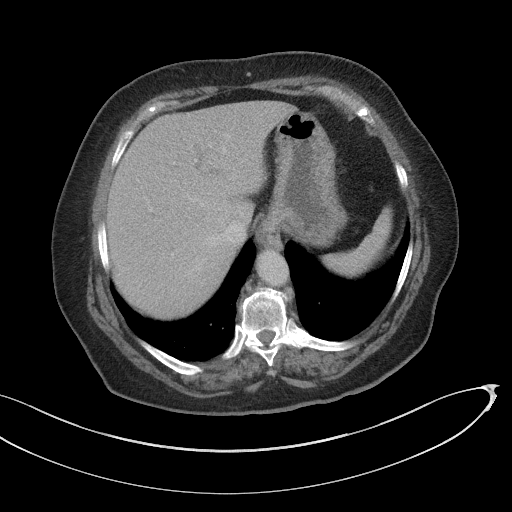
[im 103/109  soft-tissue]
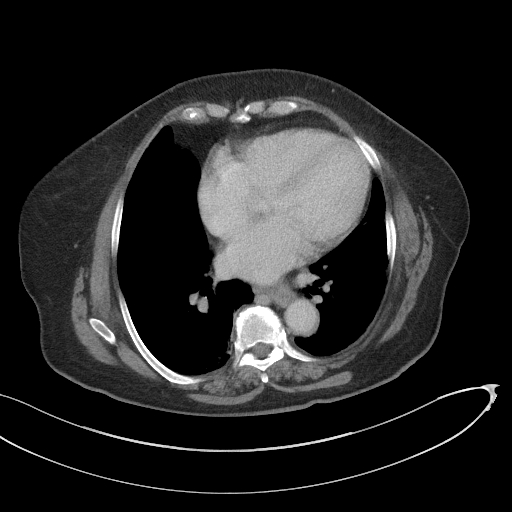

[Series 6: a/p w/ cor · coronal · 0.80mm/px · 3 of 154 slices shown]
[im 52/154  soft-tissue]
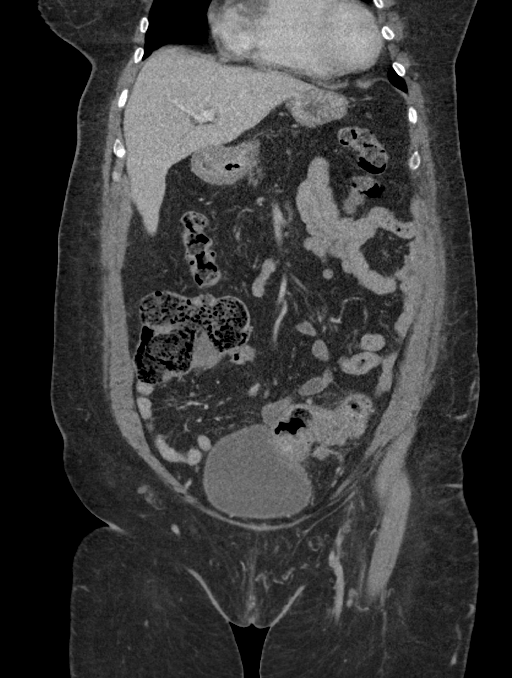
[im 69/154  soft-tissue]
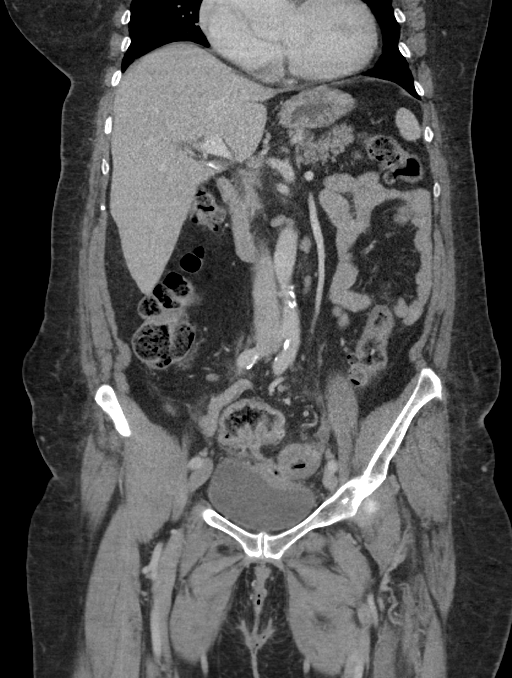
[im 86/154  soft-tissue]
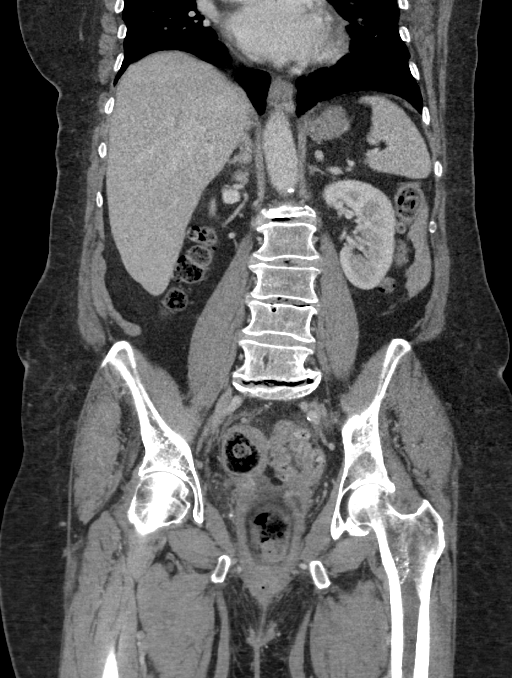

[15 of 46 positions shown; findings below may reference images not displayed]

FINDINGS: Lower chest: Mild dependent changes in the lung bases. Small
esophageal hiatal hernia.

Hepatobiliary: No focal liver abnormality is seen. Status post
cholecystectomy. No biliary dilatation.

Pancreas: Unremarkable. No pancreatic ductal dilatation or
surrounding inflammatory changes.

Spleen: Normal in size without focal abnormality.

Adrenals/Urinary Tract: No adrenal gland nodules. Scarring in the
lower pole left kidney. Sub cm cysts in the kidneys. No
hydronephrosis or hydroureter. Bladder wall is not thickened and no
filling defects are demonstrated.

Stomach/Bowel: Stomach and small bowel are decompressed.
Stool-filled colon with scattered diverticula. The sigmoid colon
demonstrates diffuse wall thickening with multiple diverticula.
There is progression since previous study. Changes suggest regional
colitis or diverticulitis. There is stranding demonstrated
throughout the fat in the pelvis around the sigmoid colon. No
discrete abscess is identified. Sigmoid colon is contiguous with the
left side of the vagina with stranding demonstrated between the
sigmoid colon and the vagina. Gas within the area stranding
consistent with fistula. Gas is present within the vagina. Appendix
is normal.

Vascular/Lymphatic: Aortic atherosclerosis. No enlarged abdominal or
pelvic lymph nodes.

Reproductive: Status post hysterectomy. No adnexal masses.

Other: No abdominal wall hernia or abnormality. No abdominopelvic
ascites.

Musculoskeletal: Diffuse degenerative changes throughout the lumbar
spine. Slight anterior subluxation at L4-5 probably is degenerative.
IMPRESSION: Diffuse wall thickening of the sigmoid colon with diverticulosis.
Changes may represent diverticulitis or regional colitis. Stranding
in the pelvic fat without discrete abscess. Sigmoid colovaginal
fistula is demonstrated with gas in the fistula tract and in the
vagina.

## 2017-10-23 ENCOUNTER — Ambulatory Visit (INDEPENDENT_AMBULATORY_CARE_PROVIDER_SITE_OTHER): Payer: Medicare HMO

## 2017-10-23 DIAGNOSIS — E538 Deficiency of other specified B group vitamins: Secondary | ICD-10-CM

## 2017-10-23 NOTE — Progress Notes (Unsigned)
Patient arrived at the 89 building at [place] at [facility] for her vitamin B12 injection.  Patient was advised she was off schedule.  She was suppose to have returned on September 29, 2017.  Patient stated she had injection done on 09/29/17 but according to her chart it was not documented.    Harrison Mons, PA-C, approved for patient to have injection done on today.     Patient received in her left deltoid and tolerated injection well.  Advised patient to return in 30 days for her next scheduled B12 injection.  She verbalized understanding.

## 2017-11-06 ENCOUNTER — Ambulatory Visit (INDEPENDENT_AMBULATORY_CARE_PROVIDER_SITE_OTHER): Payer: Medicare HMO | Admitting: Physician Assistant

## 2017-11-06 ENCOUNTER — Encounter: Payer: Self-pay | Admitting: Physician Assistant

## 2017-11-06 VITALS — BP 122/72 | HR 92 | Temp 98.2°F | Resp 17 | Ht 65.0 in | Wt 198.0 lb

## 2017-11-06 DIAGNOSIS — L309 Dermatitis, unspecified: Secondary | ICD-10-CM | POA: Diagnosis not present

## 2017-11-06 DIAGNOSIS — I872 Venous insufficiency (chronic) (peripheral): Secondary | ICD-10-CM | POA: Diagnosis not present

## 2017-11-06 DIAGNOSIS — L03116 Cellulitis of left lower limb: Secondary | ICD-10-CM

## 2017-11-06 LAB — POCT GLYCOSYLATED HEMOGLOBIN (HGB A1C): Hemoglobin A1C: 5.8

## 2017-11-06 LAB — POCT CBC
GRANULOCYTE PERCENT: 74.1 % (ref 37–80)
HEMATOCRIT: 37.8 % (ref 37.7–47.9)
HEMOGLOBIN: 12.6 g/dL (ref 12.2–16.2)
LYMPH, POC: 1.5 (ref 0.6–3.4)
MCH: 27.4 pg (ref 27–31.2)
MCHC: 33.3 g/dL (ref 31.8–35.4)
MCV: 82.3 fL (ref 80–97)
MID (cbc): 0.3 (ref 0–0.9)
MPV: 7.9 fL (ref 0–99.8)
POC GRANULOCYTE: 5.3 (ref 2–6.9)
POC LYMPH PERCENT: 21.5 %L (ref 10–50)
POC MID %: 4.4 % (ref 0–12)
Platelet Count, POC: 239 10*3/uL (ref 142–424)
RBC: 4.59 M/uL (ref 4.04–5.48)
RDW, POC: 15.4 %
WBC: 7.1 10*3/uL (ref 4.6–10.2)

## 2017-11-06 LAB — POCT SKIN KOH: Skin KOH, POC: NEGATIVE

## 2017-11-06 MED ORDER — TRIAMCINOLONE ACETONIDE 0.1 % EX CREA: TOPICAL_CREAM | Freq: Two times a day (BID) | CUTANEOUS | 0 refills | Status: DC

## 2017-11-06 MED ORDER — TRIAMCINOLONE ACETONIDE 0.1 % EX CREA: 1.0000 "application " | TOPICAL_CREAM | Freq: Two times a day (BID) | CUTANEOUS | 0 refills | Status: DC

## 2017-11-06 MED ORDER — CEPHALEXIN 500 MG PO CAPS: 500.0000 mg | ORAL_CAPSULE | Freq: Three times a day (TID) | ORAL | 0 refills | Status: DC

## 2017-11-06 NOTE — Progress Notes (Signed)
11/06/2017 3:13 PM   DOB: 1935/11/16 / MRN: [id number]  SUBJECTIVE:  Ellen Johnson is a 82 y.o. female presenting for itching about the upper back for the last two weeks. Has a history of this and is out of her topical triamcinolone.   Has a painful rash about the lower left leg. This is new for her.  Denies swelling and posterior calf pain.    She is allergic to valium [diazepam].   She  has a past medical history of Diverticulosis, GERD (gastroesophageal reflux disease), and HTN (hypertension).    She  reports that  has never smoked. she has never used smokeless tobacco. She reports that she drinks alcohol. She reports that she does not use drugs. She  reports that she does not engage in sexual activity. The patient  has a past surgical history that includes gall bladder removed; Fracture surgery; and Abdominal hysterectomy.  Her family history includes Breast cancer in her paternal grandmother; Cancer in her father; Stroke in her maternal grandmother and mother.  Review of Systems  Constitutional: Negative for chills, diaphoresis and fever.  Eyes: Negative.   Respiratory: Negative for cough, hemoptysis, sputum production, shortness of breath and wheezing.   Cardiovascular: Negative for chest pain, orthopnea and leg swelling.  Gastrointestinal: Negative for nausea.  Skin: Positive for itching and rash.  Neurological: Negative for dizziness, sensory change, speech change, focal weakness and headaches.    The problem list and medications were reviewed and updated by myself where necessary and exist elsewhere in the encounter.   OBJECTIVE:  BP 122/72   Pulse 92   Temp 98.2 F (36.8 C) (Oral)   Resp 17   Ht 5\' 5"  (1.651 m)   Wt 198 lb (89.8 kg)   SpO2 98%   BMI 32.95 kg/m   Physical Exam  Constitutional: She is active.  Non-toxic appearance.  Cardiovascular: Normal rate and regular rhythm.  Pulmonary/Chest: Effort normal and breath sounds normal. No tachypnea.    Musculoskeletal: Normal range of motion.  Neurological: She is alert.  Skin: Skin is warm and dry. Rash noted. She is not diaphoretic. No pallor.    Lab Results  Component Value Date   CREATININE 0.86 12/19/2016   BUN 14 12/19/2016   NA 137 12/19/2016   K 4.1 12/19/2016   CL 101 12/19/2016   CO2 25 12/19/2016    Results for orders placed or performed in visit on 11/06/17 (from the past 72 hour(s))  POCT CBC     Status: None   Collection Time: 11/06/17  3:06 PM  Result Value Ref Range   WBC 7.1 4.6 - 10.2 K/uL   Lymph, poc 1.5 0.6 - 3.4   POC LYMPH PERCENT 21.5 10 - 50 %L   MID (cbc) 0.3 0 - 0.9   POC MID % 4.4 0 - 12 %M   POC Granulocyte 5.3 2 - 6.9   Granulocyte percent 74.1 37 - 80 %G   RBC 4.59 4.04 - 5.48 M/uL   Hemoglobin 12.6 12.2 - 16.2 g/dL   HCT, POC 37.8 37.7 - 47.9 %   MCV 82.3 80 - 97 fL   MCH, POC 27.4 27 - 31.2 pg   MCHC 33.3 31.8 - 35.4 g/dL   RDW, POC 15.4 %   Platelet Count, POC 239 142 - 424 K/uL   MPV 7.9 0 - 99.8 fL  POCT Skin KOH     Status: None   Collection Time: 11/06/17  3:11 PM  Result Value Ref Range   Skin KOH, POC Negative Negative  POCT glycosylated hemoglobin (Hb A1C)     Status: None   Collection Time: 11/06/17  3:11 PM  Result Value Ref Range   Hemoglobin A1C 5.8     No results found.  ASSESSMENT AND PLAN:  Aviya was seen today for rash.  Diagnoses and all orders for this visit:  Cellulitis of leg, left: MIld, early.  Will cover for gram+ with keflex at medium dose.  No history of C. Diff or renal insufficiency.  -     POCT CBC -     cephALEXin (KEFLEX) 500 MG capsule; Take 1 capsule (500 mg total) by mouth 3 (three) times daily. -     POCT Skin KOH  Stasis dermatitis of both legs -     POCT glycosylated hemoglobin (Hb A1C)  Eczema, unspecified type:          -     triamcinolone cream (KENALOG) 0.1 %; Apply 1 application topically 2 (two) times daily.  The patient is advised to call or return to clinic if she does not  see an improvement in symptoms, or to seek the care of the closest emergency department if she worsens with the above plan.   Philis Fendt, MHS, PA-C [facility] 11/06/2017 3:13 PM

## 2017-11-06 NOTE — Patient Instructions (Addendum)
Take keflex with food for you leg.   Use the cream on you rash of the upper torso.  This was negative for fungus and is most likely eczema.  Apply the cream then some saran wrap to affected areas.     IF you received an x-ray today, you will receive an invoice from [facility]. Please contact [facility] at [phone] with questions or concerns regarding your invoice.   IF you received labwork today, you will receive an invoice from [facility]. Please contact [facility] at [phone] with questions or concerns regarding your invoice.   Our billing staff will not be able to assist you with questions regarding bills from these companies.  You will be contacted with the lab results as soon as they are available. The fastest way to get your results is to activate your My Chart account. Instructions are located on the last page of this paperwork. If you have not heard from [name] regarding the results in 2 weeks, please contact this office.

## 2017-11-08 DIAGNOSIS — K429 Umbilical hernia without obstruction or gangrene: Secondary | ICD-10-CM | POA: Diagnosis not present

## 2017-11-08 DIAGNOSIS — Z90710 Acquired absence of both cervix and uterus: Secondary | ICD-10-CM | POA: Diagnosis not present

## 2017-11-08 DIAGNOSIS — J449 Chronic obstructive pulmonary disease, unspecified: Secondary | ICD-10-CM | POA: Diagnosis not present

## 2017-11-08 DIAGNOSIS — K449 Diaphragmatic hernia without obstruction or gangrene: Secondary | ICD-10-CM | POA: Diagnosis not present

## 2017-11-08 DIAGNOSIS — K5730 Diverticulosis of large intestine without perforation or abscess without bleeding: Secondary | ICD-10-CM | POA: Diagnosis not present

## 2017-11-15 ENCOUNTER — Telehealth: Payer: Self-pay | Admitting: Physician Assistant

## 2017-11-15 NOTE — Telephone Encounter (Signed)
Copied from [place] [id number]. Topic: Quick Communication - Lab Results >> Nov 15, 2017 10:26 AM Conception Chancy, NT wrote: Patient is calling and would like to know if her diabetic test has came back yet from 11/06/17

## 2017-11-18 ENCOUNTER — Telehealth: Payer: Self-pay | Admitting: Physician Assistant

## 2017-11-18 ENCOUNTER — Other Ambulatory Visit: Payer: Self-pay | Admitting: Physician Assistant

## 2017-11-18 DIAGNOSIS — I872 Venous insufficiency (chronic) (peripheral): Secondary | ICD-10-CM

## 2017-11-18 NOTE — Telephone Encounter (Signed)
Copied from [place]. Topic: Quick Communication - Rx Refill/Question >> Nov 18, 2017 10:48 AM Oliver Pila B wrote: Pt also would like to be contacted about her diabetic test from 2.27.19   Medication: triamcinolone cream (KENALOG) 0.1 % [[id number]]    Has the patient contacted their pharmacy? Yes.     (Agent: If no, request that the patient contact the pharmacy for the refill.)   [facility]   Agent: Please be advised that RX refills may take up to 3 business days. We ask that you follow-up with your pharmacy.

## 2017-11-18 NOTE — Telephone Encounter (Signed)
Left VM to call back. She was prescribed Kenalog 0.1% cream for leg cellulitis on 2/27. Physician's note says return visit if not improved in 2 weeks.

## 2017-11-18 NOTE — Telephone Encounter (Signed)
Patient was prescribed this on 2/27 for cellulitis. She has been applying to her back and lt leg and the rash has improved approx. 50% she reports. She would like a refill on Triamcinolone cream (Kenalog) 0.1%  PCP Dr. Tillie Rung 2/27 [facility] [address].

## 2017-11-19 DIAGNOSIS — I10 Essential (primary) hypertension: Secondary | ICD-10-CM | POA: Insufficient documentation

## 2017-11-20 ENCOUNTER — Ambulatory Visit: Payer: Medicare HMO | Admitting: Physician Assistant

## 2017-11-20 DIAGNOSIS — K591 Functional diarrhea: Secondary | ICD-10-CM | POA: Diagnosis not present

## 2017-11-20 DIAGNOSIS — N824 Other female intestinal-genital tract fistulae: Secondary | ICD-10-CM | POA: Diagnosis not present

## 2017-11-20 DIAGNOSIS — K5792 Diverticulitis of intestine, part unspecified, without perforation or abscess without bleeding: Secondary | ICD-10-CM | POA: Diagnosis not present

## 2017-11-27 ENCOUNTER — Ambulatory Visit: Payer: Medicare HMO | Admitting: Physician Assistant

## 2017-11-27 ENCOUNTER — Other Ambulatory Visit: Payer: Self-pay | Admitting: Physician Assistant

## 2017-11-27 DIAGNOSIS — I872 Venous insufficiency (chronic) (peripheral): Secondary | ICD-10-CM

## 2017-11-27 NOTE — Telephone Encounter (Signed)
Copied from [place]. Topic: Quick Communication - See Telephone Encounter >> Nov 27, 2017  3:29 PM Vernona Rieger wrote: CRM for notification. See Telephone encounter for:   11/27/17.  Pt said she submitted a request for triamcinolone cream (KENALOG) [facility] % [facility] and they tried to give her a small tube of it bc that's what the nurse sent in and she usually gets a big jar of it. She told them she did not want it & she wants another new script sent [id number] [place], [place] - [place] [address]

## 2017-11-28 NOTE — Telephone Encounter (Signed)
Pt. Said she needs this medicine today.  Please call pt. When sent to pharmacy

## 2017-11-28 NOTE — Telephone Encounter (Signed)
Kenalog 0.1% ointment.  See attached note.   She is requesting a big jar of it instead of the small tube.   Usually gets large jar.  She refused to take the small tube and wants it reordered in the large jar like she usually gets.  LOV 11/06/17 with Philis Fendt  [facility] [address]

## 2017-11-29 MED ORDER — TRIAMCINOLONE ACETONIDE 0.1 % EX CREA
TOPICAL_CREAM | Freq: Two times a day (BID) | CUTANEOUS | 0 refills | Status: DC
Start: 2017-11-29 — End: 2017-12-04

## 2017-12-04 ENCOUNTER — Encounter: Payer: Self-pay | Admitting: Physician Assistant

## 2017-12-04 ENCOUNTER — Ambulatory Visit (INDEPENDENT_AMBULATORY_CARE_PROVIDER_SITE_OTHER): Payer: Medicare HMO | Admitting: Physician Assistant

## 2017-12-04 ENCOUNTER — Other Ambulatory Visit: Payer: Self-pay

## 2017-12-04 DIAGNOSIS — I872 Venous insufficiency (chronic) (peripheral): Secondary | ICD-10-CM

## 2017-12-04 MED ORDER — TRIAMCINOLONE ACETONIDE 0.1 % EX CREA: TOPICAL_CREAM | Freq: Two times a day (BID) | CUTANEOUS | 11 refills | Status: DC

## 2017-12-04 NOTE — Progress Notes (Signed)
Patient here refills of cream.  She has no other needs at this time. Philis Fendt, MS, PA-C 4:18 PM, 12/04/2017

## 2017-12-04 NOTE — Progress Notes (Deleted)
    12/04/2017 4:07 PM   DOB: [birth year] / MRN: [id number]  SUBJECTIVE:  Ellen Johnson is a 82 y.o. female presenting for   She is allergic to valium [diazepam].   She  has a past medical history of Diverticulosis, GERD (gastroesophageal reflux disease), and HTN (hypertension).    She  reports that she has never smoked. She has never used smokeless tobacco. She reports that she drinks alcohol. She reports that she does not use drugs. She  reports that she does not engage in sexual activity. The patient  has a past surgical history that includes gall bladder removed; Fracture surgery; and Abdominal hysterectomy.  Her family history includes Breast cancer in her paternal grandmother; Cancer in her father; Stroke in her maternal grandmother and mother.  ROS  The problem list and medications were reviewed and updated by myself where necessary and exist elsewhere in the encounter.   OBJECTIVE:  BP 128/60   Pulse 85   Temp 98.9 F (37.2 C) (Oral)   Resp 18   Ht 5\' 5"  (1.651 m)   Wt 197 lb 3.2 oz (89.4 kg)   SpO2 95%   BMI 32.82 kg/m   Lab Results  Component Value Date   HGBA1C 5.8 11/06/2017      Physical Exam  No results found for this or any previous visit (from the past 72 hour(s)).  No results found.  ASSESSMENT AND PLAN:  Ellen Johnson was seen today for leg swelling.  Diagnoses and all orders for this visit:  Stasis dermatitis of both legs: Okay to refill if she calls for refills.   -     triamcinolone cream (KENALOG) 0.1 %; Apply topically 2 (two) times daily.    The patient is advised to call or return to clinic if she does not see an improvement in symptoms, or to seek the care of the closest emergency department if she worsens with the above plan.   Philis Fendt, MHS, PA-C [facility] 12/04/2017 4:07 PM

## 2017-12-04 NOTE — Patient Instructions (Addendum)
We recommend that you schedule a mammogram for breast cancer screening. Typically, you do not need a referral to do this. Please contact a local imaging center to schedule your mammogram.  [facility] - [phone]  *ask for the Radiology Department [facility] - [phone] or [phone]  [facility] - [phone] [facility] - [phone] [facility] - [phone]  *ask for the Radiology Department [facility] - [phone]  *ask for the Radiology Department [facility] - [phone]  *ask for the Mammography Department [facility] - [phone]     IF you received an x-ray today, you will receive an invoice from [facility]. Please contact [facility] at [phone] with questions or concerns regarding your invoice.   IF you received labwork today, you will receive an invoice from [facility]. Please contact [facility] at [phone] with questions or concerns regarding your invoice.   Our billing staff will not be able to assist you with questions regarding bills from these companies.  You will be contacted with the lab results as soon as they are available. The fastest way to get your results is to activate your My Chart account. Instructions are located on the last page of this paperwork. If you have not heard from us regarding the results in 2 weeks, please contact this office.      

## 2017-12-11 ENCOUNTER — Encounter: Payer: Self-pay | Admitting: Physician Assistant

## 2017-12-16 ENCOUNTER — Encounter: Payer: Self-pay | Admitting: Physician Assistant

## 2017-12-27 NOTE — Telephone Encounter (Signed)
Pt notified and verbalized understanding.

## 2018-01-15 ENCOUNTER — Ambulatory Visit: Payer: Medicare HMO | Admitting: Physician Assistant

## 2018-01-22 ENCOUNTER — Ambulatory Visit: Payer: Medicare HMO | Admitting: Physician Assistant

## 2018-01-22 ENCOUNTER — Other Ambulatory Visit: Payer: Self-pay

## 2018-01-22 ENCOUNTER — Ambulatory Visit (INDEPENDENT_AMBULATORY_CARE_PROVIDER_SITE_OTHER): Payer: Medicare HMO | Admitting: Physician Assistant

## 2018-01-22 ENCOUNTER — Encounter: Payer: Self-pay | Admitting: Physician Assistant

## 2018-01-22 VITALS — BP 132/60 | HR 86 | Temp 98.1°F | Resp 17 | Ht 63.75 in | Wt 196.4 lb

## 2018-01-22 DIAGNOSIS — R21 Rash and other nonspecific skin eruption: Secondary | ICD-10-CM | POA: Diagnosis not present

## 2018-01-22 DIAGNOSIS — E538 Deficiency of other specified B group vitamins: Secondary | ICD-10-CM | POA: Diagnosis not present

## 2018-01-22 LAB — POCT SKIN KOH: Skin KOH, POC: NEGATIVE

## 2018-01-22 MED ORDER — CRISABOROLE 2 % EX OINT: 1.0000 "application " | TOPICAL_OINTMENT | Freq: Two times a day (BID) | CUTANEOUS | 0 refills | Status: DC

## 2018-01-22 MED ORDER — CYANOCOBALAMIN 1000 MCG/ML IJ SOLN
1000.0000 ug | INTRAMUSCULAR | Status: DC
  Administered 2018-01-22: 1000 ug via INTRAMUSCULAR

## 2018-01-22 MED ORDER — BETAMETHASONE DIPROPIONATE 0.05 % EX CREA: TOPICAL_CREAM | Freq: Two times a day (BID) | CUTANEOUS | 0 refills | Status: DC

## 2018-01-22 NOTE — Patient Instructions (Addendum)
New steroid cream along with the old cream for hip rash.   Prior auth expected in a week or so with the new eczema cream.     IF you received an x-ray today, you will receive an invoice from [facility]. Please contact [facility] at [phone] with questions or concerns regarding your invoice.   IF you received labwork today, you will receive an invoice from [facility]. Please contact [facility] at [phone] with questions or concerns regarding your invoice.   Our billing staff will not be able to assist you with questions regarding bills from these companies.  You will be contacted with the lab results as soon as they are available. The fastest way to get your results is to activate your My Chart account. Instructions are located on the last page of this paperwork. If you have not heard from [name] regarding the results in 2 weeks, please contact this office.    We recommend that you schedule a mammogram for breast cancer screening. Typically, you do not need a referral to do this. Please contact a local imaging center to schedule your mammogram.  [facility] - [phone]  *ask for the Radiology Department [facility] - [phone] or [phone]  [facility] - [phone] [place] [phone] [facility] - [phone]  *ask for the [facility] - [phone]  *ask for the Radiology Department [facility] - [phone]  *ask for the [facility] - [phone]

## 2018-01-22 NOTE — Progress Notes (Signed)
01/24/2018 4:33 PM   DOB: December 27, 1935 / MRN: [id number]  SUBJECTIVE:  Ellen Johnson is a 82 y.o. female presenting for continued rash.  Today the rash is on her left hip and there is dryness along with itchiness.  She is tried some of the steroid cream and tells me this helps a little bit.  She feels it is getting worse.  The rash on her leg and neck have improved drastically.  She is allergic to valium [diazepam].   She  has a past medical history of Diverticulosis, GERD (gastroesophageal reflux disease), and HTN (hypertension).    She  reports that she has never smoked. She has never used smokeless tobacco. She reports that she drinks alcohol. She reports that she does not use drugs. She  reports that she does not engage in sexual activity. The patient  has a past surgical history that includes gall bladder removed; Fracture surgery; and Abdominal hysterectomy.  Her family history includes Breast cancer in her paternal grandmother; Cancer in her father; Stroke in her maternal grandmother and mother.  Review of Systems  Constitutional: Negative for fever.  Respiratory: Negative for cough and shortness of breath.   Cardiovascular: Negative for chest pain.  Skin: Positive for itching and rash.    The problem list and medications were reviewed and updated by myself where necessary and exist elsewhere in the encounter.   OBJECTIVE:  BP 132/60 (BP Location: Left Arm, Patient Position: Sitting, Cuff Size: Large)   Pulse 86   Temp 98.1 F (36.7 C) (Oral)   Resp 17   Ht 5' 3.75" (1.619 m)   Wt 196 lb 6.4 oz (89.1 kg)   SpO2 95%   BMI 33.98 kg/m   Physical Exam  Constitutional: She is oriented to person, place, and time. She appears well-nourished. No distress.  Eyes: Pupils are equal, round, and reactive to light. EOM are normal.  Cardiovascular: Normal rate, regular rhythm, S1 normal, S2 normal, normal heart sounds and intact distal pulses. Exam reveals no gallop, no friction  rub and no decreased pulses.  No murmur heard. Pulmonary/Chest: Effort normal. No stridor. No respiratory distress. She has no wheezes. She has no rales.  Abdominal: She exhibits no distension.  Musculoskeletal: She exhibits no edema.  Neurological: She is alert and oriented to person, place, and time. No cranial nerve deficit. Gait normal.  Skin: Skin is dry. Rash (Xerotic rash with fine scale about the left hip.) noted. She is not diaphoretic.  Psychiatric: She has a normal mood and affect.  Vitals reviewed.   Results for orders placed or performed in visit on 01/22/18 (from the past 72 hour(s))  POCT Skin KOH     Status: None   Collection Time: 01/22/18  4:31 PM  Result Value Ref Range   Skin KOH, POC Negative Negative    No results found.  ASSESSMENT AND PLAN:  Alicja was seen today for b12 injection and f/u.  Diagnoses and all orders for this visit:  B12 deficiency -     cyanocobalamin ((VITAMIN B-12)) injection 1,000 mcg  Rash and nonspecific skin eruption: Somewhat responsive to steroids.  I am increasing the strength of her topical steroid and given that she is had such a difficult time with the rash on her lower leg and neck and shoulder area I will try her on a non-steroid topical for the treatment of eczema, as she has failed low potency topical steroid in the past. -     POCT  Skin KOH -     betamethasone dipropionate (DIPROLENE) 0.05 % cream; Apply topically 2 (two) times daily. -     Crisaborole 2 % OINT; Apply 1 application topically 2 (two) times daily.  Other orders -     Discontinue: Crisaborole 2 % OINT; Apply 1 application topically 2 (two) times daily.    The patient is advised to call or return to clinic if she does not see an improvement in symptoms, or to seek the care of the closest emergency department if she worsens with the above plan.   Philis Fendt, MHS, PA-C [facility] 01/24/2018 4:33 PM

## 2018-01-25 ENCOUNTER — Telehealth: Payer: Self-pay | Admitting: Physician Assistant

## 2018-01-25 NOTE — Telephone Encounter (Signed)
Pt. Called wishing to convey to the [name] that "the strongest cream" she was prescribed is to expensive for her and out of her price range. She was unable to identify the cream and did not wish to try. The pt. did not convey any desire to have the prescription changed.

## 2018-01-25 NOTE — Telephone Encounter (Signed)
Message to Philis Fendt, [place]

## 2018-02-25 ENCOUNTER — Other Ambulatory Visit: Payer: Self-pay | Admitting: Physician Assistant

## 2018-02-25 ENCOUNTER — Telehealth: Payer: Self-pay | Admitting: Physician Assistant

## 2018-02-25 DIAGNOSIS — Z1231 Encounter for screening mammogram for malignant neoplasm of breast: Secondary | ICD-10-CM

## 2018-02-25 NOTE — Telephone Encounter (Signed)
Copied from [place] [id number]. Topic: Quick Communication - See Telephone Encounter >> Feb 25, 2018  2:55 PM Rutherford Nail, NT wrote: CRM for notification. See Telephone encounter for: 02/25/18. Patient calling and is requesting that Daphane Shepherd put in an order for a diagnostic mammogram be sent over to the [facility]. Please advise. CB#: [phone]

## 2018-02-27 NOTE — Telephone Encounter (Signed)
Pt requesting diagnostic mammogram order be put in.   Per reports, pt was to have screening. Sent to [place] to advise.

## 2018-03-04 DIAGNOSIS — K5792 Diverticulitis of intestine, part unspecified, without perforation or abscess without bleeding: Secondary | ICD-10-CM | POA: Diagnosis not present

## 2018-03-04 DIAGNOSIS — R1032 Left lower quadrant pain: Secondary | ICD-10-CM | POA: Diagnosis not present

## 2018-03-04 DIAGNOSIS — N824 Other female intestinal-genital tract fistulae: Secondary | ICD-10-CM | POA: Diagnosis not present

## 2018-03-06 NOTE — Telephone Encounter (Signed)
Attempted to call pt.  Her mailbox is full.  Unable to leave message. If she returns call, please give her message that she only needs screening mammogram unless she has felt a nodule or other abnormality. She will need to be seen for the issue if there is one.

## 2018-03-06 NOTE — Telephone Encounter (Signed)
Message to Philis Fendt re: hole in intestine appt with surgeon

## 2018-03-06 NOTE — Telephone Encounter (Signed)
Pt returned call.  Was able to tell her the radiologist recommended a screening mammo Tried to help her understand the diagnostic is for active breast disease and if this is what she has, she will need to be seen by Philis Fendt before diagnostic can be ordered.  Phones cut off on [name].  [facility] called back and they gave her this message again.

## 2018-03-06 NOTE — Telephone Encounter (Signed)
I cannot identify a reason the patient needs a diagnostic mammogram. If she has found a lump or other breast abnormality/problem, she needs to be seen for it, in order for [name] to order the proper imaging. Otherwise, recommend that she proceed with the screening mammogram.

## 2018-03-06 NOTE — Telephone Encounter (Signed)
Pt requesting a call from Ellen Johnson regarding she met with her surgeon about the hole in her intestine. Also she states she will wait until she has her screening mammogram done before coming in for an appointment with Ellen Johnson.

## 2018-03-26 ENCOUNTER — Ambulatory Visit: Payer: Medicare HMO | Admitting: Physician Assistant

## 2018-03-28 ENCOUNTER — Encounter: Payer: Self-pay | Admitting: Physician Assistant

## 2018-03-28 ENCOUNTER — Ambulatory Visit (INDEPENDENT_AMBULATORY_CARE_PROVIDER_SITE_OTHER): Payer: Medicare HMO | Admitting: Physician Assistant

## 2018-03-28 VITALS — HR 83 | Temp 99.9°F | Resp 16 | Ht 63.5 in | Wt 198.0 lb

## 2018-03-28 DIAGNOSIS — R21 Rash and other nonspecific skin eruption: Secondary | ICD-10-CM | POA: Diagnosis not present

## 2018-03-28 DIAGNOSIS — L282 Other prurigo: Secondary | ICD-10-CM | POA: Diagnosis not present

## 2018-03-28 DIAGNOSIS — E538 Deficiency of other specified B group vitamins: Secondary | ICD-10-CM | POA: Diagnosis not present

## 2018-03-28 DIAGNOSIS — Z1231 Encounter for screening mammogram for malignant neoplasm of breast: Secondary | ICD-10-CM

## 2018-03-28 DIAGNOSIS — Z1239 Encounter for other screening for malignant neoplasm of breast: Secondary | ICD-10-CM

## 2018-03-28 MED ORDER — CYANOCOBALAMIN 1000 MCG/ML IJ SOLN
1000.0000 ug | INTRAMUSCULAR | Status: AC
  Administered 2018-03-28 – 2018-08-05 (×3): 1000 ug via INTRAMUSCULAR

## 2018-03-28 MED ORDER — HYDROXYZINE HCL 10 MG PO TABS: 10.0000 mg | ORAL_TABLET | Freq: Three times a day (TID) | ORAL | 12 refills | Status: DC | PRN

## 2018-03-28 MED ORDER — METHYLPREDNISOLONE ACETATE 80 MG/ML IJ SUSP
80.0000 mg | Freq: Once | INTRAMUSCULAR | Status: AC
  Administered 2018-03-28: 80 mg via INTRAMUSCULAR

## 2018-03-28 MED ORDER — CLOBETASOL PROPIONATE 0.05 % EX OINT: 1.0000 "application " | TOPICAL_OINTMENT | Freq: Two times a day (BID) | CUTANEOUS | 11 refills | Status: DC

## 2018-03-28 NOTE — Progress Notes (Signed)
04/01/2018 8:13 AM   DOB: January 06, 1936 / MRN: [id number]  SUBJECTIVE:  Ellen Johnson is a 82 y.o. female presenting for refills of medications. Reports painless itchy rash on her legs is well controlled with cream and plastic wrap at night. She would like refills.   She would like her B12 injection today and would also like me to order an ultrasound of her left breast.       She is allergic to valium [diazepam].   She  has a past medical history of Diverticulosis, GERD (gastroesophageal reflux disease), and HTN (hypertension).    She  reports that she has never smoked. She has never used smokeless tobacco. She reports that she drinks alcohol. She reports that she does not use drugs. She  reports that she does not engage in sexual activity. The patient  has a past surgical history that includes gall bladder removed; Fracture surgery; and Abdominal hysterectomy.  Her family history includes Breast cancer in her paternal grandmother; Cancer in her father; Stroke in her maternal grandmother and mother.  Review of Systems  Constitutional: Negative for fever.  Skin: Positive for itching and rash.    The problem list and medications were reviewed and updated by myself where necessary and exist elsewhere in the encounter.   OBJECTIVE:  Pulse 83   Temp 99.9 F (37.7 C) (Oral)   Resp 16   Ht 5' 3.5" (1.613 m)   Wt 198 lb (89.8 kg)   SpO2 94%   BMI 34.52 kg/m   Wt Readings from Last 3 Encounters:  03/28/18 198 lb (89.8 kg)  01/22/18 196 lb 6.4 oz (89.1 kg)  12/04/17 197 lb 3.2 oz (89.4 kg)   Temp Readings from Last 3 Encounters:  03/28/18 99.9 F (37.7 C) (Oral)  01/22/18 98.1 F (36.7 C) (Oral)  12/04/17 98.9 F (37.2 C) (Oral)   BP Readings from Last 3 Encounters:  01/22/18 132/60  12/04/17 128/60  11/06/17 122/72   Pulse Readings from Last 3 Encounters:  03/28/18 83  01/22/18 86  12/04/17 85    Physical Exam  Constitutional: She is oriented to person, place,  and time. She appears well-nourished. No distress.  Eyes: Pupils are equal, round, and reactive to light. EOM are normal.  Cardiovascular: Normal rate, regular rhythm, S1 normal, S2 normal, normal heart sounds and intact distal pulses. Exam reveals no gallop, no friction rub and no decreased pulses.  No murmur heard. Pulmonary/Chest: Effort normal. No stridor. No respiratory distress. She has no wheezes. She has no rales.  Abdominal: She exhibits no distension.  Musculoskeletal: She exhibits no edema.  Neurological: She is alert and oriented to person, place, and time. No cranial nerve deficit. Gait normal.  Skin: Skin is dry. She is not diaphoretic.  Psychiatric: She has a normal mood and affect.  Vitals reviewed.   Lab Results  Component Value Date   HGBA1C 5.8 11/06/2017    Lab Results  Component Value Date   WBC 6.5 03/28/2018   HGB 12.1 03/28/2018   HCT 36.8 03/28/2018   MCV 84 03/28/2018   PLT 248 03/28/2018    Lab Results  Component Value Date   CREATININE 0.90 03/28/2018   BUN 17 03/28/2018   NA 140 03/28/2018   K 4.5 03/28/2018   CL 100 03/28/2018   CO2 26 03/28/2018    Lab Results  Component Value Date   ALT 14 03/28/2018   AST 20 03/28/2018   GGT 23 03/28/2018  ALKPHOS 86 03/28/2018   BILITOT 0.2 03/28/2018    Lab Results  Component Value Date   TSH 2.809 12/10/2013    Lab Results  Component Value Date   CHOL 180 03/28/2018     ASSESSMENT AND PLAN:  Ellen Johnson was seen today for rash and b12 injection.  Diagnoses and all orders for this visit:  Rash and nonspecific skin eruption  B12 deficiency -     CMP14+6AC+CBC/D/Plt -     cyanocobalamin ((VITAMIN B-12)) injection 1,000 mcg  Pruritic rash -     CMP14+6AC+CBC/D/Plt -     methylPREDNISolone acetate (DEPO-MEDROL) injection 80 mg  Screening breast examination -     US BREAST COMPLETE UNI LEFT INC AXILLA; Future  Other orders -     clobetasol ointment (TEMOVATE) 0.05 %; Apply 1  application topically 2 (two) times daily. -     hydrOXYzine (ATARAX/VISTARIL) 10 MG tablet; Take 1 tablet (10 mg total) by mouth 3 (three) times daily as needed.    The patient is advised to call or return to clinic if she does not see an improvement in symptoms, or to seek the care of the closest emergency department if she worsens with the above plan.   Philis Fendt, MHS, PA-C [facility] 04/01/2018 8:13 AM

## 2018-03-28 NOTE — Patient Instructions (Addendum)
New more powerful ointment.  More itch pills.  Steroid shot today.    I will be at [facility]. Look me up I'd be happy to see you there.

## 2018-03-29 LAB — CMP14+6AC+CBC/D/PLT
ALBUMIN: 4.3 g/dL (ref 3.5–4.8)
ALT: 14 IU/L (ref 0–32)
AST: 20 IU/L (ref 0–40)
Albumin/Globulin Ratio: 1.9 (ref 1.2–2.2)
Alkaline Phosphatase: 86 IU/L (ref 39–117)
BASOS ABS: 0 10*3/uL (ref 0.0–0.2)
BUN: 17 mg/dL (ref 8–27)
Basos: 1 %
Bilirubin Total: 0.2 mg/dL (ref 0.0–1.2)
Bilirubin, Direct: 0.08 mg/dL (ref 0.00–0.40)
Bilirubin, Indirect: 0.12 mg/dL (ref 0.10–0.80)
CALCIUM: 10.1 mg/dL (ref 8.7–10.3)
CHOLESTEROL TOTAL: 180 mg/dL (ref 100–199)
CO2: 26 mmol/L (ref 20–29)
CREATININE: 0.9 mg/dL (ref 0.57–1.00)
Chloride: 100 mmol/L (ref 96–106)
EOS (ABSOLUTE): 0.2 10*3/uL (ref 0.0–0.4)
Eos: 4 %
GFR calc Af Amer: 70 mL/min/{1.73_m2} (ref >59)
GFR calc non Af Amer: 61 mL/min/{1.73_m2} (ref >59)
GGT: 23 IU/L (ref 0–60)
GLUCOSE: 104 mg/dL — AB (ref 65–99)
Globulin, Total: 2.3 g/dL (ref 1.5–4.5)
Hematocrit: 36.8 % (ref 34.0–46.6)
Hemoglobin: 12.1 g/dL (ref 11.1–15.9)
IMMATURE GRANULOCYTES: 1 %
Immature Grans (Abs): 0 10*3/uL (ref 0.0–0.1)
LDH: 218 IU/L (ref 119–226)
LYMPHS: 26 %
Lymphocytes Absolute: 1.7 10*3/uL (ref 0.7–3.1)
MCH: 27.7 pg (ref 26.6–33.0)
MCHC: 32.9 g/dL (ref 31.5–35.7)
MCV: 84 fL (ref 79–97)
MONOS ABS: 0.6 10*3/uL (ref 0.1–0.9)
Monocytes: 10 %
NEUTROS PCT: 58 %
Neutrophils Absolute: 3.8 10*3/uL (ref 1.4–7.0)
PHOSPHORUS: 4 mg/dL (ref 2.5–4.5)
PLATELETS: 248 10*3/uL (ref 150–450)
POTASSIUM: 4.5 mmol/L (ref 3.5–5.2)
RBC: 4.37 x10E6/uL (ref 3.77–5.28)
RDW: 14.6 % (ref 12.3–15.4)
Sodium: 140 mmol/L (ref 134–144)
TOTAL PROTEIN: 6.6 g/dL (ref 6.0–8.5)
URIC ACID: 5.3 mg/dL (ref 2.5–7.1)
WBC: 6.5 10*3/uL (ref 3.4–10.8)

## 2018-04-02 DIAGNOSIS — K5790 Diverticulosis of intestine, part unspecified, without perforation or abscess without bleeding: Secondary | ICD-10-CM | POA: Diagnosis not present

## 2018-04-02 DIAGNOSIS — K5730 Diverticulosis of large intestine without perforation or abscess without bleeding: Secondary | ICD-10-CM | POA: Diagnosis not present

## 2018-04-02 DIAGNOSIS — R1032 Left lower quadrant pain: Secondary | ICD-10-CM | POA: Diagnosis not present

## 2018-04-09 ENCOUNTER — Telehealth: Payer: Self-pay | Admitting: Physician Assistant

## 2018-04-09 NOTE — Telephone Encounter (Signed)
Copied from [place] [id number]. Topic: Inquiry >> Apr 09, 2018 12:38 PM Conception Chancy, NT wrote: Reason for CRM: patient is calling to get the name of the dermatologist Philis Fendt wanted her to go see and also is wanting to make sure he called the breast center and gave them the okay to do the test he wanted.

## 2018-04-18 ENCOUNTER — Telehealth: Payer: Self-pay | Admitting: Physician Assistant

## 2018-04-18 NOTE — Telephone Encounter (Signed)
Error

## 2018-04-23 ENCOUNTER — Other Ambulatory Visit: Payer: Self-pay | Admitting: Physician Assistant

## 2018-04-23 DIAGNOSIS — N644 Mastodynia: Secondary | ICD-10-CM

## 2018-04-30 ENCOUNTER — Ambulatory Visit
Admission: RE | Admit: 2018-04-30 | Discharge: 2018-04-30 | Disposition: A | Discharge status: Home / Self Care | Payer: Medicare HMO | Source: Ambulatory Visit | Attending: Physician Assistant | Admitting: Physician Assistant

## 2018-04-30 ENCOUNTER — Ambulatory Visit (INDEPENDENT_AMBULATORY_CARE_PROVIDER_SITE_OTHER): Payer: Medicare HMO | Admitting: Physician Assistant

## 2018-04-30 ENCOUNTER — Ambulatory Visit: Payer: Medicare HMO | Admitting: Physician Assistant

## 2018-04-30 ENCOUNTER — Encounter: Payer: Self-pay | Admitting: Physician Assistant

## 2018-04-30 ENCOUNTER — Other Ambulatory Visit: Payer: Self-pay

## 2018-04-30 VITALS — BP 134/77 | HR 89 | Temp 98.0°F | Resp 16 | Ht 63.0 in | Wt 201.0 lb

## 2018-04-30 DIAGNOSIS — E538 Deficiency of other specified B group vitamins: Secondary | ICD-10-CM

## 2018-04-30 DIAGNOSIS — N644 Mastodynia: Secondary | ICD-10-CM | POA: Diagnosis not present

## 2018-04-30 DIAGNOSIS — R928 Other abnormal and inconclusive findings on diagnostic imaging of breast: Secondary | ICD-10-CM | POA: Diagnosis not present

## 2018-04-30 DIAGNOSIS — R21 Rash and other nonspecific skin eruption: Secondary | ICD-10-CM

## 2018-04-30 DIAGNOSIS — L853 Xerosis cutis: Secondary | ICD-10-CM | POA: Diagnosis not present

## 2018-04-30 NOTE — Patient Instructions (Addendum)
  Please come back and see Dr. Brigitte Pulse to continue your care here.     If you have lab work done today you will be contacted with your lab results within the next 2 weeks.  If you have not heard from [name] then please contact us. The fastest way to get your results is to register for My Chart.   IF you received an x-ray today, you will receive an invoice from [facility]. Please contact [facility] at [phone] with questions or concerns regarding your invoice.   IF you received labwork today, you will receive an invoice from [place]. Please contact [facility] at [phone] with questions or concerns regarding your invoice.   Our billing staff will not be able to assist you with questions regarding bills from these companies.  You will be contacted with the lab results as soon as they are available. The fastest way to get your results is to activate your My Chart account. Instructions are located on the last page of this paperwork. If you have not heard from [name] regarding the results in 2 weeks, please contact this office.

## 2018-04-30 NOTE — Progress Notes (Signed)
05/07/2018 10:13 AM   DOB: [birth year] / MRN: [id number]  SUBJECTIVE:  Ellen Johnson is a 82 y.o. female presenting for continued rash.  She has seen three different dermatologist regarding this and has not seen any significant improvement.  She has not seen rhuem yet and is willing to try that route. Otherwise she feels well today.   She is allergic to valium [diazepam].   She  has a past medical history of Diverticulosis, GERD (gastroesophageal reflux disease), and HTN (hypertension).    She  reports that she has never smoked. She has never used smokeless tobacco. She reports that she drinks alcohol. She reports that she does not use drugs. She  reports that she does not engage in sexual activity. The patient  has a past surgical history that includes gall bladder removed; Fracture surgery; and Abdominal hysterectomy.  Her family history includes Breast cancer in her paternal grandmother; Cancer in her father; Stroke in her maternal grandmother and mother.  Review of Systems  Constitutional: Negative for diaphoresis.  Eyes: Negative.   Respiratory: Negative for cough, hemoptysis, sputum production, shortness of breath and wheezing.   Cardiovascular: Negative for chest pain, orthopnea and leg swelling.  Gastrointestinal: Negative for abdominal pain, blood in stool, constipation, diarrhea, heartburn, melena, nausea and vomiting.  Genitourinary: Negative for dysuria, flank pain, frequency, hematuria and urgency.  Neurological: Negative for dizziness, sensory change, speech change, focal weakness and headaches.    The problem list and medications were reviewed and updated by myself where necessary and exist elsewhere in the encounter.   OBJECTIVE:  BP 134/77   Pulse 89   Temp 98 F (36.7 C) (Oral)   Resp 16   Ht 5\' 3"  (1.6 m)   Wt 201 lb (91.2 kg)   SpO2 95%   BMI 35.61 kg/m   Wt Readings from Last 3 Encounters:  04/30/18 201 lb (91.2 kg)  03/28/18 198 lb (89.8 kg)    01/22/18 196 lb 6.4 oz (89.1 kg)   Temp Readings from Last 3 Encounters:  04/30/18 98 F (36.7 C) (Oral)  03/28/18 99.9 F (37.7 C) (Oral)  01/22/18 98.1 F (36.7 C) (Oral)   BP Readings from Last 3 Encounters:  04/30/18 134/77  01/22/18 132/60  12/04/17 128/60   Pulse Readings from Last 3 Encounters:  04/30/18 89  03/28/18 83  01/22/18 86    Physical Exam  Constitutional: She is oriented to person, place, and time. She appears well-nourished. No distress.  Eyes: Pupils are equal, round, and reactive to light. EOM are normal.  Cardiovascular: Normal rate.  Pulmonary/Chest: Effort normal.  Abdominal: She exhibits no distension.  Neurological: She is alert and oriented to person, place, and time. No cranial nerve deficit. Gait normal.  Skin: Skin is dry. Rash (dry, scaling, cracking rash about the bilateral hands. ) noted. She is not diaphoretic.  Psychiatric: She has a normal mood and affect.  Vitals reviewed.   Lab Results  Component Value Date   HGBA1C 5.8 11/06/2017    Lab Results  Component Value Date   WBC 6.5 03/28/2018   HGB 12.1 03/28/2018   HCT 36.8 03/28/2018   MCV 84 03/28/2018   PLT 248 03/28/2018    Lab Results  Component Value Date   CREATININE 0.90 03/28/2018   BUN 17 03/28/2018   NA 140 03/28/2018   K 4.5 03/28/2018   CL 100 03/28/2018   CO2 26 03/28/2018    Lab Results  Component Value Date  ALT 14 03/28/2018   AST 20 03/28/2018   GGT 23 03/28/2018   ALKPHOS 86 03/28/2018   BILITOT 0.2 03/28/2018    Lab Results  Component Value Date   TSH 2.620 04/30/2018    Lab Results  Component Value Date   CHOL 180 03/28/2018     ASSESSMENT AND PLAN:  Ellen Johnson was seen today for rash.  Diagnoses and all orders for this visit:  Rash and nonspecific skin eruption -     ANA Comprehensive Panel -     Cancel: Ambulatory referral to Rheumatology -     Ambulatory referral to Rheumatology -     TSH -     Sedimentation Rate    The  patient is advised to call or return to clinic if she does not see an improvement in symptoms, or to seek the care of the closest emergency department if she worsens with the above plan.   Philis Fendt, MHS, PA-C [facility] 05/07/2018 10:13 AM

## 2018-04-30 NOTE — Progress Notes (Deleted)
    04/30/2018 5:36 PM   DOB: 08-01-1936 / MRN: [id number]  SUBJECTIVE:  Ellen Johnson is a 82 y.o. female presenting for ***. Symptoms present for ***.  The problem is ***. She has tried ***.  She is allergic to valium [diazepam].   She  has a past medical history of Diverticulosis, GERD (gastroesophageal reflux disease), and HTN (hypertension).    She  reports that she has never smoked. She has never used smokeless tobacco. She reports that she drinks alcohol. She reports that she does not use drugs. She  reports that she does not engage in sexual activity. The patient  has a past surgical history that includes gall bladder removed; Fracture surgery; and Abdominal hysterectomy.  Her family history includes Breast cancer in her paternal grandmother; Cancer in her father; Stroke in her maternal grandmother and mother.  ROS  The problem list and medications were reviewed and updated by myself where necessary and exist elsewhere in the encounter.   OBJECTIVE:  BP 134/77   Pulse 89   Temp 98 F (36.7 C) (Oral)   Resp 16   Ht 5\' 3"  (1.6 m)   Wt 201 lb (91.2 kg)   SpO2 95%   BMI 35.61 kg/m   Wt Readings from Last 3 Encounters:  04/30/18 201 lb (91.2 kg)  03/28/18 198 lb (89.8 kg)  01/22/18 196 lb 6.4 oz (89.1 kg)   Temp Readings from Last 3 Encounters:  04/30/18 98 F (36.7 C) (Oral)  03/28/18 99.9 F (37.7 C) (Oral)  01/22/18 98.1 F (36.7 C) (Oral)   BP Readings from Last 3 Encounters:  04/30/18 134/77  01/22/18 132/60  12/04/17 128/60   Pulse Readings from Last 3 Encounters:  04/30/18 89  03/28/18 83  01/22/18 86    Physical Exam  Lab Results  Component Value Date   HGBA1C 5.8 11/06/2017    Lab Results  Component Value Date   WBC 6.5 03/28/2018   HGB 12.1 03/28/2018   HCT 36.8 03/28/2018   MCV 84 03/28/2018   PLT 248 03/28/2018    Lab Results  Component Value Date   CREATININE 0.90 03/28/2018   BUN 17 03/28/2018   NA 140 03/28/2018   K 4.5  03/28/2018   CL 100 03/28/2018   CO2 26 03/28/2018    Lab Results  Component Value Date   ALT 14 03/28/2018   AST 20 03/28/2018   GGT 23 03/28/2018   ALKPHOS 86 03/28/2018   BILITOT 0.2 03/28/2018    Lab Results  Component Value Date   TSH 2.809 12/10/2013    Lab Results  Component Value Date   CHOL 180 03/28/2018     ASSESSMENT AND PLAN:  Kabrea was seen today for rash.  Diagnoses and all orders for this visit:  Rash and nonspecific skin eruption -     ANA Comprehensive Panel -     Cancel: Ambulatory referral to Rheumatology -     Ambulatory referral to Rheumatology -     TSH -     Sedimentation Rate    The patient is advised to call or return to clinic if she does not see an improvement in symptoms, or to seek the care of the closest emergency department if she worsens with the above plan.   Philis Fendt, MHS, PA-C [facility] 04/30/2018 5:36 PM

## 2018-05-01 LAB — ANA COMPREHENSIVE PANEL
Anti JO-1: <0.2 AI (ref 0.0–0.9)
Centromere Ab Screen: <0.2 AI (ref 0.0–0.9)
Chromatin Ab SerPl-aCnc: <0.2 AI (ref 0.0–0.9)
ENA SM Ab Ser-aCnc: <0.2 AI (ref 0.0–0.9)
ENA SSB (LA) Ab: <0.2 AI (ref 0.0–0.9)
dsDNA Ab: <1 IU/mL (ref 0–9)

## 2018-05-01 LAB — SEDIMENTATION RATE: SED RATE: 29 mm/h (ref 0–40)

## 2018-05-01 LAB — TSH: TSH: 2.62 u[IU]/mL (ref 0.450–4.500)

## 2018-05-05 DIAGNOSIS — Q438 Other specified congenital malformations of intestine: Secondary | ICD-10-CM | POA: Diagnosis not present

## 2018-05-05 DIAGNOSIS — R1032 Left lower quadrant pain: Secondary | ICD-10-CM | POA: Diagnosis not present

## 2018-05-05 DIAGNOSIS — K5730 Diverticulosis of large intestine without perforation or abscess without bleeding: Secondary | ICD-10-CM | POA: Diagnosis not present

## 2018-05-05 DIAGNOSIS — N823 Fistula of vagina to large intestine: Secondary | ICD-10-CM | POA: Diagnosis not present

## 2018-05-05 DIAGNOSIS — K648 Other hemorrhoids: Secondary | ICD-10-CM | POA: Diagnosis not present

## 2018-05-16 ENCOUNTER — Telehealth: Payer: Self-pay | Admitting: Family Medicine

## 2018-05-30 ENCOUNTER — Ambulatory Visit: Payer: Medicare HMO | Admitting: Family Medicine

## 2018-06-06 ENCOUNTER — Ambulatory Visit: Payer: Medicare HMO | Admitting: Emergency Medicine

## 2018-06-23 ENCOUNTER — Telehealth: Payer: Self-pay | Admitting: Emergency Medicine

## 2018-06-23 DIAGNOSIS — R1032 Left lower quadrant pain: Secondary | ICD-10-CM | POA: Diagnosis not present

## 2018-06-23 NOTE — Telephone Encounter (Signed)
Copied from [place] [id number]. Topic: General - Other >> Jun 23, 2018  8:42 AM Sheran Luz wrote: Reason for CRM: Kieth Brightly from [place] states pt has no showed last 2 appointments and will not schedule pt again.

## 2018-06-23 NOTE — Telephone Encounter (Signed)
Noted  

## 2018-07-02 ENCOUNTER — Telehealth: Payer: Self-pay | Admitting: Emergency Medicine

## 2018-07-02 NOTE — Telephone Encounter (Signed)
Does patient need to have an appointment for her B-12 or can we do a walk in nurse visit?

## 2018-07-02 NOTE — Telephone Encounter (Signed)
No appt needed for B12 injections.

## 2018-07-02 NOTE — Telephone Encounter (Signed)
Copied from [place] [id number]. Topic: Appointment Scheduling - Scheduling Inquiry for Clinic >> Jul 01, 2018  4:35 PM Percell Belt A wrote: Reason for CRM:  Pt called requesting to be worked in next week.  She stated it is important that she see someone next week because she needs her b12 injection and has not seen anyone seem Micheal Clark.  I offer next available in nov, pt wants to be seen this month   Best number  [phone] Best Monday wed or fri -  she stated she has been coming to this location for 20 years

## 2018-07-03 ENCOUNTER — Telehealth: Payer: Self-pay | Admitting: Family Medicine

## 2018-07-03 NOTE — Telephone Encounter (Signed)
Called to let patient know that she could walk in for her b12 injections. Patient didn't answer and her mailbox was full so I couldn't leave her a VM.

## 2018-07-03 NOTE — Telephone Encounter (Signed)
DONE

## 2018-07-20 ENCOUNTER — Ambulatory Visit (HOSPITAL_COMMUNITY)
Admission: EM | Admit: 2018-07-20 | Discharge: 2018-07-20 | Disposition: A | Discharge status: Home / Self Care | Payer: Medicare HMO | Attending: Family Medicine | Admitting: Family Medicine

## 2018-07-20 ENCOUNTER — Encounter (HOSPITAL_COMMUNITY): Payer: Self-pay | Admitting: Emergency Medicine

## 2018-07-20 DIAGNOSIS — L309 Dermatitis, unspecified: Secondary | ICD-10-CM | POA: Diagnosis not present

## 2018-07-20 MED ORDER — METHYLPREDNISOLONE SODIUM SUCC 125 MG IJ SOLR
80.0000 mg | Freq: Once | INTRAMUSCULAR | Status: AC
  Administered 2018-07-20: 80 mg via INTRAMUSCULAR

## 2018-07-20 MED ORDER — DOXYCYCLINE HYCLATE 100 MG PO CAPS: 100.0000 mg | ORAL_CAPSULE | Freq: Two times a day (BID) | ORAL | 0 refills | Status: DC

## 2018-07-20 MED ORDER — METHYLPREDNISOLONE SODIUM SUCC 125 MG IJ SOLR
INTRAMUSCULAR | Status: AC
  Filled 2018-07-20: qty 2

## 2018-07-20 MED ORDER — PREDNISONE 20 MG PO TABS: ORAL_TABLET | ORAL | 0 refills | Status: DC

## 2018-07-20 NOTE — ED Triage Notes (Signed)
Pt here for finger pain and swelling to left hand x 3 days

## 2018-07-20 NOTE — ED Provider Notes (Signed)
[name]    CSN: [id number] Arrival date & time: 07/20/18  1738     History   Chief Complaint Chief Complaint  Patient presents with  . Finger Injury    HPI Ellen Johnson is a 82 y.o. female.   HPI  Patient is here for a rash on her left hand.  Is been present several days.  Is getting worse.  There is splitting of the skin.  Is becoming more swollen and painful.  She absolutely denies that she had any exposure to any new chemical, soap, lotion, or options that might have irritated her skin.  She states her skin is usually "dirty".  She denies rashes or eczema.  Interestingly she has 2 steroid creams on her medication list.  She states that it has been years and she uses steroid cream.  Try to put cream on his current rash and it would staying.  It is getting worse.  Past Medical History:  Diagnosis Date  . Diverticulosis   . GERD (gastroesophageal reflux disease)   . HTN (hypertension)     Patient Active Problem List   Diagnosis Date Noted  . HTN (hypertension) 11/19/2017  . GERD (gastroesophageal reflux disease) 02/27/2017  . Diverticulosis   . Colovaginal fistula 03/28/2016  . DIVERTICULOSIS OF COLON 07/30/2008  . DYSPHAGIA UNSPECIFIED 07/30/2008  . Anemia 05/11/2008  . History of colonic polyps 11/28/2007    Past Surgical History:  Procedure Laterality Date  . ABDOMINAL HYSTERECTOMY     TAH BSO  . FRACTURE SURGERY    . gall bladder removed      OB History    Gravida  4   Para  4   Term      Preterm      AB      Living  4     SAB      TAB      Ectopic      Multiple      Live Births               Home Medications    Prior to Admission medications   Medication Sig Start Date End Date Taking? Authorizing Provider  doxycycline (VIBRAMYCIN) 100 MG capsule Take 1 capsule (100 mg total) by mouth 2 (two) times daily. 07/20/18   [name], MD  fish oil-omega-3 fatty acids 1000 MG capsule Take 1 g by mouth daily.      [provider]  Multiple Vitamin (MULTIVITAMIN) tablet Take 1 tablet by mouth daily.    [provider]  omeprazole (PRILOSEC) 40 MG capsule Take 40 mg by mouth daily.    [provider]  predniSONE (DELTASONE) 20 MG tablet One pill BID for one week then one pill daily 07/20/18   [name], MD  vitamin C (ASCORBIC ACID) 500 MG tablet Take 500 mg by mouth daily. Takes only during winter months    [provider]    Family History Family History  Problem Relation Age of Onset  . Stroke Mother   . Cancer Father        Colon  . Stroke Maternal Grandmother   . Breast cancer Paternal Grandmother     Social History Social History   Tobacco Use  . Smoking status: Never Smoker  . Smokeless tobacco: Never Used  Substance Use Topics  . Alcohol use: Yes    Comment: 4 oz weekly of wine  . Drug use: No  Allergies   Valium [diazepam]   Review of Systems Review of Systems  Constitutional: Negative for chills and fever.  HENT: Negative for ear pain and sore throat.   Eyes: Negative for pain and visual disturbance.  Respiratory: Negative for cough and shortness of breath.   Cardiovascular: Negative for chest pain and palpitations.  Gastrointestinal: Negative for abdominal pain and vomiting.  Genitourinary: Negative for dysuria and hematuria.  Musculoskeletal: Negative for arthralgias and back pain.  Skin: Positive for rash. Negative for color change.  Neurological: Negative for seizures and syncope.  All other systems reviewed and are negative.    Physical Exam Triage Vital Signs ED Triage Vitals [07/20/18 1750]  Enc Vitals Group     BP (!) 146/62     Pulse Rate 99     Resp 18     Temp 97.9 F (36.6 C)     Temp Source Oral     SpO2 100 %     Weight      Height      Head Circumference      Peak Flow      Pain Score 5     Pain Loc      Pain Edu?      Excl. in [place]?    No data found.  Updated Vital Signs BP (!)  146/62 (BP Location: Left Arm)   Pulse 99   Temp 97.9 F (36.6 C) (Oral)   Resp 18   SpO2 100%   Visual Acuity Right Eye Distance:   Left Eye Distance:   Bilateral Distance:    Right Eye Near:   Left Eye Near:    Bilateral Near:     Physical Exam  Constitutional: She appears well-developed and well-nourished. No distress.  HENT:  Head: Normocephalic and atraumatic.  Mouth/Throat: Oropharynx is clear and moist.  Eyes: Pupils are equal, round, and reactive to light. Conjunctivae are normal.  Neck: Normal range of motion.  Cardiovascular: Normal rate.  Pulmonary/Chest: Effort normal. No respiratory distress.  Abdominal: Soft. She exhibits no distension.  Musculoskeletal: Normal range of motion. She exhibits no edema.  Neurological: She is alert.  Skin: Skin is warm and dry.  The left hand has rash over the third fourth and fifth fingers to the MCP joints.  There is scaling.  There is fissuring.  Mild erythema.  It looks like an eczematous rash.  There is fissuring of the skin so it is possible she has a early infection.  We will treat her with antibiotics and steroids.     UC Treatments / Results  Labs (all labs ordered are listed, but only abnormal results are displayed) Labs Reviewed - No data to display  EKG None  Radiology No results found.  Procedures Procedures (including critical care time)  Medications Ordered in UC Medications  methylPREDNISolone sodium succinate (SOLU-MEDROL) 125 mg/2 mL injection 80 mg (80 mg Intramuscular Given 07/20/18 1824)    Initial Impression / Assessment and Plan / UC Course  I have reviewed the triage vital signs and the nursing notes.  Pertinent labs & imaging results that were available during my care of the patient were reviewed by me and considered in my medical decision making (see chart for details).     Eczema.  Unknown trigger. Final Clinical Impressions(s) / UC Diagnoses   Final diagnoses:  Eczema of left hand      Discharge Instructions     Avoid any soap or chemical on your hands Use vaseline for  moisture Take the antibiotic 2 x a day for a week Take the oral prednisone as directed Follow up with your doctor as needed    ED Prescriptions    Medication Sig Dispense Auth. Provider   doxycycline (VIBRAMYCIN) 100 MG capsule Take 1 capsule (100 mg total) by mouth 2 (two) times daily. 14 capsule [name], MD   predniSONE (DELTASONE) 20 MG tablet One pill BID for one week then one pill daily 15 tablet [name], MD     Controlled Substance Prescriptions [place] Controlled Substance Registry consulted? Not Applicable   [name], MD 07/20/18 [phone]

## 2018-07-20 NOTE — Discharge Instructions (Signed)
Avoid any soap or chemical on your hands Use vaseline for moisture Take the antibiotic 2 x a day for a week Take the oral prednisone as directed Follow up with your doctor as needed

## 2018-07-29 ENCOUNTER — Telehealth: Payer: Self-pay | Admitting: Emergency Medicine

## 2018-07-29 NOTE — Telephone Encounter (Signed)
When pt calls in - we may offer to have her just keep her appt with Brigitte Pulse on 12/27 as both appts were scheduled for transfer of care from [place].

## 2018-07-29 NOTE — Telephone Encounter (Signed)
LVM for pt to call the office and reschedule their appt. Due to schedule changes, their provider will not be available. When pt calls back, please reschedule at their convenience.   ° °Thank you!  °

## 2018-07-30 ENCOUNTER — Ambulatory Visit: Payer: Medicare HMO | Admitting: Emergency Medicine

## 2018-07-30 NOTE — Telephone Encounter (Signed)
Pt called stating she is at [facility] hospital and has called for every day for the last 7-10 days and no one will let her schedule an appointment for b12. She said to call her back with appt and ended the call. I tried calling pt back to notify her to walk in for b12 but no answer and VM is full.

## 2018-07-31 NOTE — Telephone Encounter (Signed)
Called but got no answer and VM full

## 2018-08-05 ENCOUNTER — Ambulatory Visit (INDEPENDENT_AMBULATORY_CARE_PROVIDER_SITE_OTHER): Payer: Self-pay | Admitting: Family Medicine

## 2018-08-05 DIAGNOSIS — E538 Deficiency of other specified B group vitamins: Secondary | ICD-10-CM

## 2018-08-06 ENCOUNTER — Telehealth: Payer: Self-pay | Admitting: Family Medicine

## 2018-08-06 NOTE — Telephone Encounter (Signed)
LVM for pt to call the office and reschedule their appt with Dr. Nolon Rod on 08/20/18. Due to provider schedule change, Dr. Nolon Rod will not be in the office this day. When pt calls back, please reschedule with Dr. Nolon Rod at their convenience. Please offer 08/27/18 as we have them on hold for pts of [facility] that are having to be rescheduled. Thank you!  Also - pt came in yesterday (12/26) and stated that she would not be coming back to the office due to not being able to have good access to a provider. She may or may not want to keep the appt with Dr. Nolon Rod. If she does, please offer 12/18 or see if anything is available earlier.  Thank you!

## 2018-08-20 ENCOUNTER — Ambulatory Visit: Payer: 59 | Admitting: Family Medicine

## 2018-08-20 ENCOUNTER — Ambulatory Visit: Payer: 59 | Admitting: Emergency Medicine

## 2018-09-05 ENCOUNTER — Ambulatory Visit: Payer: Medicare HMO | Admitting: Family Medicine

## 2018-09-11 ENCOUNTER — Encounter: Payer: Self-pay | Admitting: Family Medicine

## 2018-09-12 ENCOUNTER — Ambulatory Visit: Payer: Medicare HMO | Admitting: Family Medicine

## 2018-09-30 ENCOUNTER — Encounter (HOSPITAL_COMMUNITY): Payer: Self-pay | Admitting: Family Medicine

## 2018-09-30 ENCOUNTER — Ambulatory Visit (HOSPITAL_COMMUNITY)
Admission: EM | Admit: 2018-09-30 | Discharge: 2018-09-30 | Disposition: A | Discharge status: Home / Self Care | Payer: Medicare HMO | Attending: Family Medicine | Admitting: Family Medicine

## 2018-09-30 DIAGNOSIS — L308 Other specified dermatitis: Secondary | ICD-10-CM

## 2018-09-30 MED ORDER — METHYLPREDNISOLONE ACETATE 80 MG/ML IJ SUSP
80.0000 mg | Freq: Once | INTRAMUSCULAR | Status: AC
  Administered 2018-09-30: 80 mg via INTRAMUSCULAR

## 2018-09-30 MED ORDER — METHYLPREDNISOLONE ACETATE 80 MG/ML IJ SUSP
INTRAMUSCULAR | Status: AC
  Filled 2018-09-30: qty 1

## 2018-09-30 MED ORDER — TRIAMCINOLONE ACETONIDE 0.1 % EX CREA: 1.0000 "application " | TOPICAL_CREAM | Freq: Two times a day (BID) | CUTANEOUS | 0 refills | Status: DC

## 2018-09-30 MED ORDER — CETIRIZINE HCL 10 MG PO TABS: 10.0000 mg | ORAL_TABLET | Freq: Every day | ORAL | 1 refills | Status: DC

## 2018-09-30 NOTE — ED Provider Notes (Signed)
[place]    CSN: [id number] Arrival date & time: 09/30/18  1109     History   Chief Complaint Chief Complaint  Patient presents with  . Rash    HPI Ellen Johnson is a 83 y.o. female.   This is a 83 year old established patient at [facility] urgent care, here for follow-up.  She has developed over the last several weeks an itchy rash on both arms and both legs.  In addition there is some crustiness to the skin.  She has been using lotions and moisturizers to no effect.     Past Medical History:  Diagnosis Date  . Anemia   . Diverticulosis   . GERD (gastroesophageal reflux disease)   . HTN (hypertension)   . Hx of colonic polyps     Patient Active Problem List   Diagnosis Date Noted  . HTN (hypertension) 11/19/2017  . GERD (gastroesophageal reflux disease) 02/27/2017  . Diverticulosis   . Colovaginal fistula 03/28/2016  . DIVERTICULOSIS OF COLON 07/30/2008  . DYSPHAGIA UNSPECIFIED 07/30/2008  . Anemia 05/11/2008  . History of colonic polyps 11/28/2007    Past Surgical History:  Procedure Laterality Date  . ABDOMINAL HYSTERECTOMY     TAH BSO  . APPENDECTOMY    . CHOLECYSTECTOMY    . FRACTURE SURGERY      OB History    Gravida  4   Para  4   Term      Preterm      AB      Living  4     SAB      TAB      Ectopic      Multiple      Live Births               Home Medications    Prior to Admission medications   Medication Sig Start Date End Date Taking? Authorizing Provider  cetirizine (ZYRTEC) 10 MG tablet Take 1 tablet (10 mg total) by mouth at bedtime. 09/30/18   [name], MD  fish oil-omega-3 fatty acids 1000 MG capsule Take 1 g by mouth daily.     [provider]  Multiple Vitamin (MULTIVITAMIN) tablet Take 1 tablet by mouth daily.    [provider]  omeprazole (PRILOSEC) 40 MG capsule Take 40 mg by mouth daily.    [provider]  triamcinolone cream (KENALOG) 0.1 % Apply 1  application topically 2 (two) times daily. 09/30/18   [name], MD  vitamin C (ASCORBIC ACID) 500 MG tablet Take 500 mg by mouth daily. Takes only during winter months    [provider]    Family History Family History  Problem Relation Age of Onset  . Stroke Mother   . Colon cancer Father   . Stroke Father   . Stroke Maternal Grandmother   . Breast cancer Paternal Grandmother     Social History Social History   Tobacco Use  . Smoking status: Never Smoker  . Smokeless tobacco: Never Used  Substance Use Topics  . Alcohol use: Yes    Comment: 4 oz weekly of wine  . Drug use: No     Allergies   Valium [diazepam]   Review of Systems Review of Systems   Physical Exam Triage Vital Signs ED Triage Vitals  Enc Vitals Group     BP      Pulse      Resp      Temp  Temp src      SpO2      Weight      Height      Head Circumference      Peak Flow      Pain Score      Pain Loc      Pain Edu?      Excl. in [place]?    No data found.  Updated Vital Signs BP 130/77 (BP Location: Right Arm)   Pulse 77   Temp 98.2 F (36.8 C) (Oral)   Resp 18   SpO2 96%    Physical Exam Vitals signs and nursing note reviewed.  Constitutional:      Appearance: Normal appearance.  HENT:     Head: Normocephalic.     Right Ear: External ear normal.     Left Ear: External ear normal.     Nose: Nose normal.     Mouth/Throat:     Pharynx: Oropharynx is clear.  Eyes:     Conjunctiva/sclera: Conjunctivae normal.  Neck:     Musculoskeletal: Normal range of motion and neck supple.  Musculoskeletal: Normal range of motion.  Skin:    Comments: Eczematous changes over both forearms and lower legs.  Neurological:     General: No focal deficit present.     Mental Status: She is alert.  Psychiatric:        Mood and Affect: Mood normal.        Behavior: Behavior normal.      UC Treatments / Results  Labs (all labs ordered are listed, but only abnormal results  are displayed) Labs Reviewed - No data to display  EKG None  Radiology No results found.  Procedures Procedures (including critical care time)  Medications Ordered in UC Medications  methylPREDNISolone acetate (DEPO-MEDROL) injection 80 mg (80 mg Intramuscular Given 09/30/18 1149)    Initial Impression / Assessment and Plan / UC Course  I have reviewed the triage vital signs and the nursing notes.  Pertinent labs & imaging results that were available during my care of the patient were reviewed by me and considered in my medical decision making (see chart for details).    Final Clinical Impressions(s) / UC Diagnoses   Final diagnoses:  Other eczema   Discharge Instructions   None    ED Prescriptions    Medication Sig Dispense Auth. Provider   triamcinolone cream (KENALOG) 0.1 % Apply 1 application topically 2 (two) times daily. 453 g [name], MD   cetirizine (ZYRTEC) 10 MG tablet Take 1 tablet (10 mg total) by mouth at bedtime. 10 tablet [name], MD     Controlled Substance Prescriptions [place] Controlled Substance Registry consulted? Yes, I have consulted the [place] Controlled Substances Registry for this patient, and feel the risk/benefit ratio today is favorable for proceeding with this prescription for a controlled substance.   [name], MD 09/30/18 1150

## 2018-09-30 NOTE — ED Triage Notes (Signed)
Pt states here to see Dr. Carlean Jews. C/o red itchy spot to arms and legs for over a month; states took benadryl with no relief

## 2018-11-07 DIAGNOSIS — N824 Other female intestinal-genital tract fistulae: Secondary | ICD-10-CM | POA: Diagnosis not present

## 2018-11-07 DIAGNOSIS — K5732 Diverticulitis of large intestine without perforation or abscess without bleeding: Secondary | ICD-10-CM | POA: Diagnosis not present

## 2018-11-28 DIAGNOSIS — K5732 Diverticulitis of large intestine without perforation or abscess without bleeding: Secondary | ICD-10-CM | POA: Diagnosis not present

## 2018-11-28 DIAGNOSIS — N824 Other female intestinal-genital tract fistulae: Secondary | ICD-10-CM | POA: Diagnosis not present

## 2019-01-17 ENCOUNTER — Other Ambulatory Visit: Payer: Self-pay

## 2019-01-17 ENCOUNTER — Encounter (HOSPITAL_COMMUNITY): Payer: Self-pay | Admitting: Emergency Medicine

## 2019-01-17 ENCOUNTER — Ambulatory Visit (HOSPITAL_COMMUNITY)
Admission: EM | Admit: 2019-01-17 | Discharge: 2019-01-17 | Disposition: A | Discharge status: Home / Self Care | Payer: Medicare HMO | Attending: Family Medicine | Admitting: Family Medicine

## 2019-01-17 DIAGNOSIS — L309 Dermatitis, unspecified: Secondary | ICD-10-CM

## 2019-01-17 MED ORDER — PREDNISONE 20 MG PO TABS: 40.0000 mg | ORAL_TABLET | Freq: Every day | ORAL | 0 refills | Status: AC

## 2019-01-17 MED ORDER — HYDROXYZINE PAMOATE 25 MG PO CAPS: 25.0000 mg | ORAL_CAPSULE | Freq: Three times a day (TID) | ORAL | 0 refills | Status: DC | PRN

## 2019-01-17 NOTE — ED Provider Notes (Signed)
  [place]    CSN: [id number] Arrival date & time: 01/17/19  1754     History    Chief Complaint  Patient presents with  . Rash    Ellen Johnson is a 83 y.o. female here for a skin complaint.  Duration: several days Location: chest, back, legs Pruritic? Yes Painful? Yes on chest Drainage? No New soaps/lotions/topicals/detergents? No Sick contacts? No Other associated symptoms: none Therapies tried thus far: Kenalog  ROS:  Const: No fevers Skin: As noted in HPI  Past Medical History:  Diagnosis Date  . Anemia   . Diverticulosis   . GERD (gastroesophageal reflux disease)   . HTN (hypertension)   . Hx of colonic polyps     BP (!) 158/76 (BP Location: Right Arm)   Pulse 94   Temp 98.1 F (36.7 C) (Oral)   Resp 18   SpO2 95%  Gen: awake, alert, appearing stated age Lungs: No accessory muscle use Skin: Excoriations on LE's b/l, hyperpigmentation; on ant L chest there is a nodule surrounded by hyperpigmention with scattered excoriation. No drainage, fluctuance, ttp, excessive warmth Psych: Age appropriate judgment and insight   Final Clinical Impressions(s) / UC Diagnoses   Final diagnoses:  Eczema, unspecified type   Tx itch. Kenalog + Vaseline under Saran wrap. Pred burst. Vistaril prn. Cold. Don't itch. Refer to derm.    Discharge Instructions     Try not to itch.  Cold can be helpful.      ED Prescriptions    Medication Sig Dispense Auth. Provider   predniSONE (DELTASONE) 20 MG tablet Take 2 tablets (40 mg total) by mouth daily with breakfast for 5 days. 10 tablet Shelda Pal, DO   hydrOXYzine (VISTARIL) 25 MG capsule Take 1 capsule (25 mg total) by mouth 3 (three) times daily as needed. 30 capsule Shelda Pal, DO     Controlled Substance Prescriptions [place] Controlled Substance Registry consulted? Not Applicable   Shelda Pal, [place] 01/17/19 [phone]

## 2019-01-17 NOTE — Discharge Instructions (Signed)
Try not to itch.  Cold can be helpful.

## 2019-01-17 NOTE — ED Triage Notes (Signed)
Pt here for rash with itching; pt sts hx of same

## 2019-02-11 ENCOUNTER — Encounter (HOSPITAL_COMMUNITY): Payer: Self-pay | Admitting: Family Medicine

## 2019-02-11 ENCOUNTER — Other Ambulatory Visit: Payer: Self-pay

## 2019-02-11 ENCOUNTER — Ambulatory Visit (HOSPITAL_COMMUNITY)
Admission: EM | Admit: 2019-02-11 | Discharge: 2019-02-11 | Disposition: A | Discharge status: Home / Self Care | Payer: Medicare HMO | Attending: Family Medicine | Admitting: Family Medicine

## 2019-02-11 DIAGNOSIS — L2084 Intrinsic (allergic) eczema: Secondary | ICD-10-CM

## 2019-02-11 MED ORDER — METHYLPREDNISOLONE ACETATE 80 MG/ML IJ SUSP
INTRAMUSCULAR | Status: AC
  Filled 2019-02-11: qty 1

## 2019-02-11 MED ORDER — TRIAMCINOLONE ACETONIDE 0.1 % EX CREA: 1.0000 "application " | TOPICAL_CREAM | Freq: Two times a day (BID) | CUTANEOUS | 6 refills | Status: DC

## 2019-02-11 MED ORDER — DIPHENHYDRAMINE HCL 25 MG PO CAPS
ORAL_CAPSULE | ORAL | Status: AC
  Filled 2019-02-11: qty 2

## 2019-02-11 MED ORDER — METHYLPREDNISOLONE ACETATE 80 MG/ML IJ SUSP
80.0000 mg | Freq: Once | INTRAMUSCULAR | Status: AC
  Administered 2019-02-11: 80 mg via INTRAMUSCULAR

## 2019-02-11 MED ORDER — HYDROXYZINE PAMOATE 25 MG PO CAPS: 25.0000 mg | ORAL_CAPSULE | Freq: Three times a day (TID) | ORAL | 11 refills | Status: DC | PRN

## 2019-02-11 MED ORDER — DIPHENHYDRAMINE HCL 25 MG PO CAPS
50.0000 mg | ORAL_CAPSULE | Freq: Once | ORAL | Status: AC
  Administered 2019-02-11: 50 mg via ORAL

## 2019-02-11 NOTE — Discharge Instructions (Signed)
Return June 17th after 4 pm

## 2019-02-11 NOTE — ED Provider Notes (Signed)
Ellen Johnson    CSN: [id number] Arrival date & time: 02/11/19  1941     History   Chief Complaint Chief Complaint  Patient presents with  . Rash    HPI Ellen Johnson is a 83 y.o. female.   This is an 83 year old woman who is well-known to me and is returning to [facility] urgent care for an acute flare of her eczema on both legs, left hip, chest, and arms.  She is responded to triamcinolone in the past and is required injections when it is gotten this bad before.  She would like something for itching as well.  No fever.     Past Medical History:  Diagnosis Date  . Anemia   . Diverticulosis   . GERD (gastroesophageal reflux disease)   . HTN (hypertension)   . Hx of colonic polyps     Patient Active Problem List   Diagnosis Date Noted  . HTN (hypertension) 11/19/2017  . GERD (gastroesophageal reflux disease) 02/27/2017  . Diverticulosis   . Colovaginal fistula 03/28/2016  . DIVERTICULOSIS OF COLON 07/30/2008  . DYSPHAGIA UNSPECIFIED 07/30/2008  . Anemia 05/11/2008  . History of colonic polyps 11/28/2007    Past Surgical History:  Procedure Laterality Date  . ABDOMINAL HYSTERECTOMY     TAH BSO  . APPENDECTOMY    . CHOLECYSTECTOMY    . FRACTURE SURGERY      OB History    Gravida  4   Para  4   Term      Preterm      AB      Living  4     SAB      TAB      Ectopic      Multiple      Live Births               Home Medications    Prior to Admission medications   Medication Sig Start Date End Date Taking? Authorizing Provider  fish oil-omega-3 fatty acids 1000 MG capsule Take 1 g by mouth daily.     [provider]  hydrOXYzine (VISTARIL) 25 MG capsule Take 1 capsule (25 mg total) by mouth 3 (three) times daily as needed. 02/11/19   [name], MD  Multiple Vitamin (MULTIVITAMIN) tablet Take 1 tablet by mouth daily.    [provider]  omeprazole (PRILOSEC) 40 MG capsule Take 40 mg by mouth daily.     [provider]  triamcinolone cream (KENALOG) 0.1 % Apply 1 application topically 2 (two) times daily. 02/11/19   [name], MD  vitamin C (ASCORBIC ACID) 500 MG tablet Take 500 mg by mouth daily. Takes only during winter months    [provider]    Family History Family History  Problem Relation Age of Onset  . Stroke Mother   . Colon cancer Father   . Stroke Father   . Stroke Maternal Grandmother   . Breast cancer Paternal Grandmother     Social History Social History   Tobacco Use  . Smoking status: Never Smoker  . Smokeless tobacco: Never Used  Substance Use Topics  . Alcohol use: Yes    Comment: 4 oz weekly of wine  . Drug use: No     Allergies   Valium [diazepam]   Review of Systems Review of Systems   Physical Exam Triage Vital Signs ED Triage Vitals  Enc Vitals Group     BP 02/11/19 1957 135/86  Pulse Rate 02/11/19 1957 90     Resp 02/11/19 1957 18     Temp 02/11/19 1957 98.6 F (37 C)     Temp Source 02/11/19 1957 Oral     SpO2 02/11/19 1957 95 %     Weight --      Height --      Head Circumference --      Peak Flow --      Pain Score 02/11/19 1956 0     Pain Loc --      Pain Edu? --      Excl. in [place]? --    No data found.  Updated Vital Signs BP 135/86 (BP Location: Left Arm)   Pulse 90   Temp 98.6 F (37 C) (Oral)   Resp 18   SpO2 95%    Physical Exam Vitals signs and nursing note reviewed.  Constitutional:      Appearance: Normal appearance.  HENT:     Head: Normocephalic.  Eyes:     Conjunctiva/sclera: Conjunctivae normal.  Neck:     Musculoskeletal: Normal range of motion and neck supple.  Pulmonary:     Effort: Pulmonary effort is normal.  Musculoskeletal: Normal range of motion.  Skin:    General: Skin is warm.     Findings: Erythema present.     Comments: Marked eczematous rash both lower extremities with mild edema, left hip is excoriated diffusely over the gluteal region, left chest is  excoriated and dry peeling skin is noted.  Neurological:     Mental Status: She is alert.  Psychiatric:        Mood and Affect: Mood normal.      UC Treatments / Results  Labs (all labs ordered are listed, but only abnormal results are displayed) Labs Reviewed - No data to display  EKG None  Radiology No results found.  Procedures Procedures (including critical care time)  Medications Ordered in UC Medications  methylPREDNISolone acetate (DEPO-MEDROL) injection 80 mg (80 mg Intramuscular Given 02/11/19 2014)  diphenhydrAMINE (BENADRYL) capsule 50 mg (50 mg Oral Given 02/11/19 2019)    Initial Impression / Assessment and Plan / UC Course  I have reviewed the triage vital signs and the nursing notes.  Pertinent labs & imaging results that were available during my care of the patient were reviewed by me and considered in my medical decision making (see chart for details).    Final Clinical Impressions(s) / UC Diagnoses   Final diagnoses:  Intrinsic eczema     Discharge Instructions     Return June 17th after 4 pm    ED Prescriptions    Medication Sig Dispense Auth. Provider   hydrOXYzine (VISTARIL) 25 MG capsule Take 1 capsule (25 mg total) by mouth 3 (three) times daily as needed. 30 capsule [name], MD   triamcinolone cream (KENALOG) 0.1 % Apply 1 application topically 2 (two) times daily. 454 g [name], MD     Controlled Substance Prescriptions [place] Controlled Substance Registry consulted? Not Applicable   [name], MD 02/11/19 2020

## 2019-02-11 NOTE — ED Triage Notes (Signed)
Patient presents to Urgent Care with complaints of itchy, very dry rash on bilateral legs, feet, and upper thigh since several months ago. Patient reports she has been seen for same recently, has been trying "everything" and it will not get better.

## 2019-02-13 DIAGNOSIS — R21 Rash and other nonspecific skin eruption: Secondary | ICD-10-CM | POA: Diagnosis not present

## 2019-02-13 DIAGNOSIS — I872 Venous insufficiency (chronic) (peripheral): Secondary | ICD-10-CM | POA: Diagnosis not present

## 2019-02-13 DIAGNOSIS — I8312 Varicose veins of left lower extremity with inflammation: Secondary | ICD-10-CM | POA: Diagnosis not present

## 2019-02-13 DIAGNOSIS — I8311 Varicose veins of right lower extremity with inflammation: Secondary | ICD-10-CM | POA: Diagnosis not present

## 2019-03-02 DIAGNOSIS — L309 Dermatitis, unspecified: Secondary | ICD-10-CM | POA: Diagnosis not present

## 2019-03-02 DIAGNOSIS — L950 Livedoid vasculitis: Secondary | ICD-10-CM | POA: Diagnosis not present

## 2019-03-10 ENCOUNTER — Telehealth: Payer: Self-pay | Admitting: *Deleted

## 2019-03-10 NOTE — Telephone Encounter (Signed)
Schedule AWV.  

## 2019-03-16 ENCOUNTER — Encounter: Payer: Self-pay | Admitting: Emergency Medicine

## 2019-03-16 ENCOUNTER — Other Ambulatory Visit: Payer: Self-pay

## 2019-03-16 ENCOUNTER — Ambulatory Visit (INDEPENDENT_AMBULATORY_CARE_PROVIDER_SITE_OTHER): Payer: Medicare HMO | Admitting: Emergency Medicine

## 2019-03-16 VITALS — BP 113/70 | HR 98 | Temp 98.5°F | Resp 16 | Wt 193.8 lb

## 2019-03-16 DIAGNOSIS — E559 Vitamin D deficiency, unspecified: Secondary | ICD-10-CM

## 2019-03-16 DIAGNOSIS — L309 Dermatitis, unspecified: Secondary | ICD-10-CM | POA: Diagnosis not present

## 2019-03-16 DIAGNOSIS — L2084 Intrinsic (allergic) eczema: Secondary | ICD-10-CM | POA: Diagnosis not present

## 2019-03-16 MED ORDER — CYANOCOBALAMIN 1000 MCG/ML IJ SOLN
1000.0000 ug | INTRAMUSCULAR | Status: DC
  Administered 2019-03-16: 14:00:00 1000 ug via INTRAMUSCULAR

## 2019-03-16 NOTE — Patient Instructions (Addendum)
If you have lab work done today you will be contacted with your lab results within the next 2 weeks.  If you have not heard from [name] then please contact us. The fastest way to get your results is to register for My Chart.   IF you received an x-ray today, you will receive an invoice from [facility]. Please contact [facility] at [phone] with questions or concerns regarding your invoice.   IF you received labwork today, you will receive an invoice from [place]. Please contact [facility] at [phone] with questions or concerns regarding your invoice.   Our billing staff will not be able to assist you with questions regarding bills from these companies.  You will be contacted with the lab results as soon as they are available. The fastest way to get your results is to activate your My Chart account. Instructions are located on the last page of this paperwork. If you have not heard from [name] regarding the results in 2 weeks, please contact this office.     Eczema Eczema is a broad term for a group of skin conditions that cause skin to become rough and inflamed. Each type of eczema has different triggers, symptoms, and treatments. Eczema of any type is usually itchy and symptoms range from mild to severe. Eczema and its symptoms are not spread from person to person (are not contagious). It can appear on different parts of the body at different times. Your eczema may not look the same as someone else's eczema. What are the types of eczema? Atopic dermatitis This is a long-term (chronic) skin disease that keeps coming back (recurring). Usual symptoms are dry skin and small, solid pimples that may swell and leak fluid (weep). Contact dermatitis  This happens when something irritates the skin and causes a rash. The irritation can come from substances that you are allergic to (allergens), such as poison ivy, chemicals, or medicines that were applied to your  skin. Dyshidrotic eczema This is a form of eczema on the hands and feet. It shows up as very itchy, fluid-filled blisters. It can affect people of any age, but is more common before age 70. Hand eczema  This causes very itchy areas of skin on the palms and sides of the hands and fingers. This type of eczema is common in industrial jobs where you may be exposed to many different types of irritants. Lichen simplex chronicus This type of eczema occurs when a person constantly scratches one area of the body. Repeated scratching of the area leads to thickened skin (lichenification). Lichen simplex chronicus can occur along with other types of eczema. It is more common in adults, but may be seen in children as well. Nummular eczema This is a common type of eczema. It has no known cause. It typically causes a red, circular, crusty lesion (plaque) that may be itchy. Scratching may become a habit and can cause bleeding. Nummular eczema occurs most often in people of middle-age or older. It most often affects the hands. Seborrheic dermatitis This is a common skin disease that mainly affects the scalp. It may also affect any oily areas of the body, such as the face, sides of nose, eyebrows, ears, eyelids, and chest. It is marked by small scaling and redness of the skin (erythema). This can affect people of all ages. In infants, this condition is known as Chartered certified accountant." Stasis dermatitis This is a common skin disease that usually appears on the legs and feet. It  most often occurs in people who have a condition that prevents blood from being pumped through the veins in the legs (chronic venous insufficiency). Stasis dermatitis is a chronic condition that needs long-term management. How is eczema diagnosed? Your health care provider will examine your skin and review your medical history. He or she may also give you skin patch tests. These tests involve taking patches that contain possible allergens and placing them  on your back. He or she will then check in a few days to see if an allergic reaction occurred. What are the common treatments? Treatment for eczema is based on the type of eczema you have. Hydrocortisone steroid medicine can relieve itching quickly and help reduce inflammation. This medicine may be prescribed or obtained over-the-counter, depending on the strength of the medicine that is needed. Follow these instructions at home:  Take over-the-counter and prescription medicines only as told by your health care provider.  Use creams or ointments to moisturize your skin. Do not use lotions.  Learn what triggers or irritates your symptoms. Avoid these things.  Treat symptom flare-ups quickly.  Do not itch your skin. This can make your rash worse.  Keep all follow-up visits as told by your health care provider. This is important. Where to find more information  The American Academy of Dermatology: http://jones-macias.info/  The National Eczema Association: www.nationaleczema.org Contact a health care provider if:  You have serious itching, even with treatment.  You regularly scratch your skin until it bleeds.  Your rash looks different than usual.  Your skin is painful, swollen, or more red than usual.  You have a fever. Summary  There are eight general types of eczema. Each type has different triggers.  Eczema of any type causes itching that may range from mild to severe.  Treatment varies based on the type of eczema you have. Hydrocortisone steroid medicine can help with itching and inflammation.  Protecting your skin is the best way to prevent eczema. Use moisturizers and lotions. Avoid triggers and irritants, and treat flare-ups quickly. This information is not intended to replace advice given to you by your health care provider. Make sure you discuss any questions you have with your health care provider. Document Released: 01/10/2017 Document Revised: 08/09/2017 Document Reviewed:  01/10/2017 Elsevier Patient Education  2020 [employer].

## 2019-03-16 NOTE — Progress Notes (Signed)
Ellen Johnson 83 y.o.   Chief Complaint  Patient presents with  . blisters    on both feet x 1 year, per patient she had been to [facility], and Vitamin B 12 injection    HISTORY OF PRESENT ILLNESS: This is a 83 y.o. female complaining of nonspecific chronic rash to both feet for at least 1 year.  Has been seen by a dermatologist and has an upcoming appointment next Friday.  No other significant symptoms.   HPI   Prior to Admission medications   Medication Sig Start Date End Date Taking? Authorizing Provider  Ferrous Sulfate (IRON PO) Take by mouth daily.   Yes [provider]  fish oil-omega-3 fatty acids 1000 MG capsule Take 1 g by mouth daily.    Yes [provider]  GINSENG PO Take by mouth daily.   Yes [provider]  Multiple Vitamin (MULTIVITAMIN) tablet Take 1 tablet by mouth daily.   Yes [provider]  omeprazole (PRILOSEC) 40 MG capsule Take 40 mg by mouth daily.   Yes [provider]  Probiotic Product (PROBIOTIC DAILY PO) Take by mouth.   Yes [provider]  vitamin C (ASCORBIC ACID) 500 MG tablet Take 500 mg by mouth daily. Takes only during winter months   Yes [provider]  hydrOXYzine (VISTARIL) 25 MG capsule Take 1 capsule (25 mg total) by mouth 3 (three) times daily as needed. Patient not taking: Reported on 03/16/2019 02/11/19   [name], MD  triamcinolone cream (KENALOG) 0.1 % Apply 1 application topically 2 (two) times daily. Patient not taking: Reported on 03/16/2019 02/11/19   [name], MD    Allergies  Allergen Reactions  . Valium [Diazepam] Other (See Comments)    Totally different personality    Patient Active Problem List   Diagnosis Date Noted  . HTN (hypertension) 11/19/2017  . GERD (gastroesophageal reflux disease) 02/27/2017  . Diverticulosis   . Colovaginal fistula 03/28/2016  . DIVERTICULOSIS OF COLON 07/30/2008  . DYSPHAGIA UNSPECIFIED 07/30/2008  .  Anemia 05/11/2008  . History of colonic polyps 11/28/2007    Past Medical History:  Diagnosis Date  . Anemia   . Diverticulosis   . GERD (gastroesophageal reflux disease)   . HTN (hypertension)   . Hx of colonic polyps     Past Surgical History:  Procedure Laterality Date  . ABDOMINAL HYSTERECTOMY     TAH BSO  . APPENDECTOMY    . CHOLECYSTECTOMY    . FRACTURE SURGERY      Social History   Socioeconomic History  . Marital status: Widowed    Spouse name: Not on file  . Number of children: 7  . Years of education: Not on file  . Highest education level: Associate degree: academic program  Occupational History  . Occupation: Therapist, sports  Social Needs  . Financial resource strain: Not hard at all  . Food insecurity    Worry: Never true    Inability: Never true  . Transportation needs    Medical: No    Non-medical: No  Tobacco Use  . Smoking status: Never Smoker  . Smokeless tobacco: Never Used  Substance and Sexual Activity  . Alcohol use: Yes    Comment: 4 oz weekly of wine  . Drug use: No  . Sexual activity: Never  Lifestyle  . Physical activity    Days per week: 2 days    Minutes per session: 120 min  . Stress: To some extent  Relationships  . Social connections    Talks on phone: More than three times a week    Gets together: More than three times a week    Attends religious service: More than 4 times per year    Active member of club or organization: Yes    Attends meetings of clubs or organizations: More than 4 times per year    Relationship status: Widowed  . Intimate partner violence    Fear of current or ex partner: No    Emotionally abused: No    Physically abused: No    Forced sexual activity: No  Other Topics Concern  . Not on file  Social History Narrative  . Not on file    Family History  Problem Relation Age of Onset  . Stroke Mother   . Colon cancer Father   . Stroke Father   . Stroke Maternal Grandmother   . Breast cancer Paternal  Grandmother      Review of Systems  Constitutional: Negative.  Negative for chills and fever.  HENT: Negative.   Eyes: Negative.   Respiratory: Negative.  Negative for shortness of breath.   Cardiovascular: Negative.  Negative for chest pain and palpitations.  Gastrointestinal: Negative.  Negative for abdominal pain, diarrhea, nausea and vomiting.  Genitourinary: Negative.  Negative for dysuria.  Musculoskeletal: Negative.  Negative for back pain, myalgias and neck pain.  Skin: Positive for itching and rash.  Neurological: Negative.  Negative for dizziness and headaches.  Endo/Heme/Allergies: Negative.   All other systems reviewed and are negative.  Vitals:   03/16/19 1508  BP: 113/70  Pulse: 98  Resp: 16  Temp: 98.5 F (36.9 C)  SpO2: 93%     Physical Exam Vitals signs reviewed.  Constitutional:      Appearance: Normal appearance.  HENT:     Head: Normocephalic and atraumatic.  Eyes:     Extraocular Movements: Extraocular movements intact.     Conjunctiva/sclera: Conjunctivae normal.     Pupils: Pupils are equal, round, and reactive to light.  Neck:     Musculoskeletal: Normal range of motion and neck supple.  Cardiovascular:     Rate and Rhythm: Normal rate and regular rhythm.     Pulses: Normal pulses.     Heart sounds: Normal heart sounds.  Pulmonary:     Effort: Pulmonary effort is normal.     Breath sounds: Normal breath sounds.  Musculoskeletal: Normal range of motion.  Skin:    General: Skin is warm and dry.     Capillary Refill: Capillary refill takes less than 2 seconds.     Comments: Feet: Positive eczema bilaterally.  No signs of cellulitis. NVI.  Neurological:     General: No focal deficit present.     Mental Status: She is alert and oriented to person, place, and time.      ASSESSMENT & PLAN: Makyla was seen today for blisters.  Diagnoses and all orders for this visit:  Eczema, unspecified type -     cyanocobalamin ((VITAMIN B-12))  injection 1,000 mcg -     CBC with Differential/Platelet -     Comprehensive metabolic panel -     Vitamin B1 -     Vitamin B12  Vitamin D deficiency    Patient Instructions       If you have lab work done today you will be contacted with your lab results within the next 2 weeks.  If you have not heard from [name] then please  contact us. The fastest way to get your results is to register for My Chart.   IF you received an x-ray today, you will receive an invoice from [facility]. Please contact [facility] at [phone] with questions or concerns regarding your invoice.   IF you received labwork today, you will receive an invoice from [facility]. Please contact [facility] at [phone] with questions or concerns regarding your invoice.   Our billing staff will not be able to assist you with questions regarding bills from these companies.  You will be contacted with the lab results as soon as they are available. The fastest way to get your results is to activate your My Chart account. Instructions are located on the last page of this paperwork. If you have not heard from [name] regarding the results in 2 weeks, please contact this office.     Eczema Eczema is a broad term for a group of skin conditions that cause skin to become rough and inflamed. Each type of eczema has different triggers, symptoms, and treatments. Eczema of any type is usually itchy and symptoms range from mild to severe. Eczema and its symptoms are not spread from person to person (are not contagious). It can appear on different parts of the body at different times. Your eczema may not look the same as someone else's eczema. What are the types of eczema? Atopic dermatitis This is a long-term (chronic) skin disease that keeps coming back (recurring). Usual symptoms are dry skin and small, solid pimples that may swell and leak fluid (weep). Contact dermatitis  This happens when something irritates the  skin and causes a rash. The irritation can come from substances that you are allergic to (allergens), such as poison ivy, chemicals, or medicines that were applied to your skin. Dyshidrotic eczema This is a form of eczema on the hands and feet. It shows up as very itchy, fluid-filled blisters. It can affect people of any age, but is more common before age 82. Hand eczema  This causes very itchy areas of skin on the palms and sides of the hands and fingers. This type of eczema is common in industrial jobs where you may be exposed to many different types of irritants. Lichen simplex chronicus This type of eczema occurs when a person constantly scratches one area of the body. Repeated scratching of the area leads to thickened skin (lichenification). Lichen simplex chronicus can occur along with other types of eczema. It is more common in adults, but may be seen in children as well. Nummular eczema This is a common type of eczema. It has no known cause. It typically causes a red, circular, crusty lesion (plaque) that may be itchy. Scratching may become a habit and can cause bleeding. Nummular eczema occurs most often in people of middle-age or older. It most often affects the hands. Seborrheic dermatitis This is a common skin disease that mainly affects the scalp. It may also affect any oily areas of the body, such as the face, sides of nose, eyebrows, ears, eyelids, and chest. It is marked by small scaling and redness of the skin (erythema). This can affect people of all ages. In infants, this condition is known as Chartered certified accountant." Stasis dermatitis This is a common skin disease that usually appears on the legs and feet. It most often occurs in people who have a condition that prevents blood from being pumped through the veins in the legs (chronic venous insufficiency). Stasis dermatitis is a chronic condition that needs long-term  management. How is eczema diagnosed? Your health care provider will examine  your skin and review your medical history. He or she may also give you skin patch tests. These tests involve taking patches that contain possible allergens and placing them on your back. He or she will then check in a few days to see if an allergic reaction occurred. What are the common treatments? Treatment for eczema is based on the type of eczema you have. Hydrocortisone steroid medicine can relieve itching quickly and help reduce inflammation. This medicine may be prescribed or obtained over-the-counter, depending on the strength of the medicine that is needed. Follow these instructions at home:  Take over-the-counter and prescription medicines only as told by your health care provider.  Use creams or ointments to moisturize your skin. Do not use lotions.  Learn what triggers or irritates your symptoms. Avoid these things.  Treat symptom flare-ups quickly.  Do not itch your skin. This can make your rash worse.  Keep all follow-up visits as told by your health care provider. This is important. Where to find more information  The American Academy of Dermatology: http://jones-macias.info/  The National Eczema Association: www.nationaleczema.org Contact a health care provider if:  You have serious itching, even with treatment.  You regularly scratch your skin until it bleeds.  Your rash looks different than usual.  Your skin is painful, swollen, or more red than usual.  You have a fever. Summary  There are eight general types of eczema. Each type has different triggers.  Eczema of any type causes itching that may range from mild to severe.  Treatment varies based on the type of eczema you have. Hydrocortisone steroid medicine can help with itching and inflammation.  Protecting your skin is the best way to prevent eczema. Use moisturizers and lotions. Avoid triggers and irritants, and treat flare-ups quickly. This information is not intended to replace advice given to you by your health  care provider. Make sure you discuss any questions you have with your health care provider. Document Released: 01/10/2017 Document Revised: 08/09/2017 Document Reviewed: 01/10/2017 Elsevier Patient Education  2020 Elsevier Inc.      Agustina Caroli, MD [facility]

## 2019-03-17 LAB — COMPREHENSIVE METABOLIC PANEL
ALT: 12 IU/L (ref 0–32)
AST: 20 IU/L (ref 0–40)
Albumin/Globulin Ratio: 1.8 (ref 1.2–2.2)
Albumin: 4 g/dL (ref 3.7–4.7)
Alkaline Phosphatase: 80 IU/L (ref 39–117)
BUN/Creatinine Ratio: 20 (ref 12–28)
BUN: 17 mg/dL (ref 8–27)
Bilirubin Total: <0.2 mg/dL (ref 0.0–1.2)
CO2: 24 mmol/L (ref 20–29)
Calcium: 10 mg/dL (ref 8.7–10.3)
Chloride: 101 mmol/L (ref 96–106)
Creatinine, Ser: 0.87 mg/dL (ref 0.57–1.00)
GFR calc Af Amer: 73 mL/min/{1.73_m2} (ref >59)
GFR calc non Af Amer: 63 mL/min/{1.73_m2} (ref >59)
Globulin, Total: 2.2 g/dL (ref 1.5–4.5)
Glucose: 104 mg/dL — ABNORMAL HIGH (ref 65–99)
Potassium: 4.7 mmol/L (ref 3.5–5.2)
Sodium: 139 mmol/L (ref 134–144)
Total Protein: 6.2 g/dL (ref 6.0–8.5)

## 2019-03-17 LAB — CBC WITH DIFFERENTIAL/PLATELET
Basophils Absolute: 0.1 10*3/uL (ref 0.0–0.2)
Basos: 1 %
EOS (ABSOLUTE): 0.2 10*3/uL (ref 0.0–0.4)
Eos: 2 %
Hematocrit: 35.3 % (ref 34.0–46.6)
Hemoglobin: 11.5 g/dL (ref 11.1–15.9)
Immature Grans (Abs): 0 10*3/uL (ref 0.0–0.1)
Immature Granulocytes: 1 %
Lymphocytes Absolute: 1 10*3/uL (ref 0.7–3.1)
Lymphs: 13 %
MCH: 27.5 pg (ref 26.6–33.0)
MCHC: 32.6 g/dL (ref 31.5–35.7)
MCV: 84 fL (ref 79–97)
Monocytes Absolute: 0.6 10*3/uL (ref 0.1–0.9)
Monocytes: 7 %
Neutrophils Absolute: 6.3 10*3/uL (ref 1.4–7.0)
Neutrophils: 76 %
Platelets: 286 10*3/uL (ref 150–450)
RBC: 4.18 x10E6/uL (ref 3.77–5.28)
RDW: 14.3 % (ref 11.7–15.4)
WBC: 8.2 10*3/uL (ref 3.4–10.8)

## 2019-03-19 LAB — VITAMIN B12: Vitamin B-12: >2000 pg/mL — ABNORMAL HIGH (ref 232–1245)

## 2019-03-19 LAB — VITAMIN B1: Thiamine: 118.2 nmol/L (ref 66.5–200.0)

## 2019-03-20 DIAGNOSIS — C4491 Basal cell carcinoma of skin, unspecified: Secondary | ICD-10-CM | POA: Diagnosis not present

## 2019-03-20 DIAGNOSIS — L239 Allergic contact dermatitis, unspecified cause: Secondary | ICD-10-CM | POA: Diagnosis not present

## 2019-03-20 DIAGNOSIS — C44519 Basal cell carcinoma of skin of other part of trunk: Secondary | ICD-10-CM | POA: Diagnosis not present

## 2019-03-20 DIAGNOSIS — I8311 Varicose veins of right lower extremity with inflammation: Secondary | ICD-10-CM | POA: Diagnosis not present

## 2019-03-20 DIAGNOSIS — I872 Venous insufficiency (chronic) (peripheral): Secondary | ICD-10-CM | POA: Diagnosis not present

## 2019-03-20 DIAGNOSIS — Z85828 Personal history of other malignant neoplasm of skin: Secondary | ICD-10-CM | POA: Diagnosis not present

## 2019-03-20 DIAGNOSIS — I8312 Varicose veins of left lower extremity with inflammation: Secondary | ICD-10-CM | POA: Diagnosis not present

## 2019-03-23 ENCOUNTER — Telehealth: Payer: Self-pay | Admitting: Emergency Medicine

## 2019-03-23 NOTE — Telephone Encounter (Signed)
General/Other - Call back  The patient has called several times and she can not get through. Her dermatologist will not be paid because her doctor never sent the referral. Please send referral to [facility]  Patient would like her lab results. Please either call her and give them to her or send the letter. Please call back   [facility] [phone]

## 2019-03-24 ENCOUNTER — Encounter: Payer: Self-pay | Admitting: *Deleted

## 2019-03-24 NOTE — Telephone Encounter (Signed)
Pt has been dismissed from this practice.

## 2019-03-24 NOTE — Telephone Encounter (Signed)
Pt called and stated she was advised to see a dermatologist by Dr. Nani Ravens and she did. But [employer] will not cover the cost unless a referral is sent to them for the dermatologist visit. Referral needs to be sent to  [facility], [place]

## 2019-03-27 DIAGNOSIS — I8312 Varicose veins of left lower extremity with inflammation: Secondary | ICD-10-CM | POA: Diagnosis not present

## 2019-03-27 DIAGNOSIS — I872 Venous insufficiency (chronic) (peripheral): Secondary | ICD-10-CM | POA: Diagnosis not present

## 2019-03-27 DIAGNOSIS — Z85828 Personal history of other malignant neoplasm of skin: Secondary | ICD-10-CM | POA: Diagnosis not present

## 2019-03-27 DIAGNOSIS — I8311 Varicose veins of right lower extremity with inflammation: Secondary | ICD-10-CM | POA: Diagnosis not present

## 2019-04-02 ENCOUNTER — Telehealth: Payer: Self-pay

## 2019-04-02 NOTE — Telephone Encounter (Signed)
Please find out what this is about.  We referred this patient for evaluation of chronic eczema.  Thanks.

## 2019-04-02 NOTE — Telephone Encounter (Signed)
Not our patient  Copied from [place] [id number]. Topic: General - Other >> Apr 02, 2019  2:54 PM Rainey Pines A wrote: Almyra Free from [facility] would like a callback in regards to a referral for patient at [phone]

## 2019-04-03 DIAGNOSIS — I8311 Varicose veins of right lower extremity with inflammation: Secondary | ICD-10-CM | POA: Diagnosis not present

## 2019-04-03 DIAGNOSIS — I872 Venous insufficiency (chronic) (peripheral): Secondary | ICD-10-CM | POA: Diagnosis not present

## 2019-04-03 DIAGNOSIS — Z85828 Personal history of other malignant neoplasm of skin: Secondary | ICD-10-CM | POA: Diagnosis not present

## 2019-04-03 DIAGNOSIS — I8312 Varicose veins of left lower extremity with inflammation: Secondary | ICD-10-CM | POA: Diagnosis not present

## 2019-04-03 NOTE — Telephone Encounter (Signed)
Look like we have not seen this patient. I see a referral in the computer but its from [facility]. She will call the urgent care back to see what she can get from them

## 2019-04-10 ENCOUNTER — Other Ambulatory Visit: Payer: Self-pay

## 2019-04-10 ENCOUNTER — Ambulatory Visit (HOSPITAL_COMMUNITY)
Admission: EM | Admit: 2019-04-10 | Discharge: 2019-04-10 | Disposition: A | Discharge status: Home / Self Care | Payer: Medicare HMO

## 2019-04-22 ENCOUNTER — Telehealth: Payer: Self-pay | Admitting: Emergency Medicine

## 2019-04-22 NOTE — Telephone Encounter (Signed)
Copied from [place] [phone]. Topic: General - Other >> Apr 22, 2019  2:42 PM Keene Breath wrote: Reason for CRM: [facility] called to speak with the referral coordinator.  Please call Almyra Free at [phone]

## 2019-05-14 DIAGNOSIS — H6122 Impacted cerumen, left ear: Secondary | ICD-10-CM | POA: Diagnosis not present

## 2019-05-27 ENCOUNTER — Telehealth: Payer: Self-pay | Admitting: *Deleted

## 2019-05-27 NOTE — Telephone Encounter (Signed)
Schedule awv  

## 2019-06-18 ENCOUNTER — Encounter: Payer: Self-pay | Admitting: Gynecology

## 2019-06-27 ENCOUNTER — Ambulatory Visit (HOSPITAL_COMMUNITY)
Admission: EM | Admit: 2019-06-27 | Discharge: 2019-06-27 | Disposition: A | Discharge status: Home / Self Care | Payer: Medicare HMO | Attending: Family Medicine | Admitting: Family Medicine

## 2019-06-27 ENCOUNTER — Other Ambulatory Visit: Payer: Self-pay

## 2019-06-27 ENCOUNTER — Encounter (HOSPITAL_COMMUNITY): Payer: Self-pay

## 2019-06-27 DIAGNOSIS — L2084 Intrinsic (allergic) eczema: Secondary | ICD-10-CM | POA: Diagnosis not present

## 2019-06-27 MED ORDER — FUROSEMIDE 20 MG PO TABS: 20.0000 mg | ORAL_TABLET | Freq: Every day | ORAL | 3 refills | Status: DC

## 2019-06-27 MED ORDER — BETAMETHASONE DIPROPIONATE 0.05 % EX CREA: TOPICAL_CREAM | Freq: Two times a day (BID) | CUTANEOUS | 6 refills | Status: DC

## 2019-06-27 NOTE — Discharge Instructions (Addendum)
Patient told not to wash feet frequently and never use alcohol, peroxide, or Epsom Salts on feet.  Wrap legs at night after applying the steroid cream.

## 2019-06-27 NOTE — ED Triage Notes (Signed)
Pt states she has skin rash on both feet. Pt has a lump on her right side of her neck.

## 2019-06-27 NOTE — ED Provider Notes (Signed)
[name]    CSN: [id number] Arrival date & time: 06/27/19  1710      History   Chief Complaint Chief Complaint  Patient presents with  . skin rash    HPI Ellen Johnson is a 83 y.o. female.   This is an 83 year old woman who is an established [facility] urgent [facility] patient and has chronic dermatitis of her ankles and feet.  She has seen several different dermatologists and has been prescribed different steroid creams over the years.  Nevertheless, she continues to have burning and itching as well as peeling skin bilaterally.  We have discussed the possibility of allergic reaction, in particular to latex-containing hosiery.  Patient denies chronic use of elastic stockings or dyed wool on her legs.  In July patient was checked for diabetes, kidney disease, liver disease, B1 deficiency, and B12 deficiency.  These tests were all negative.  (Note: Patient takes B12 shots)  A year ago, patient was checked for lupus, immune disease, and hypothyroidism.     Past Medical History:  Diagnosis Date  . Anemia   . Diverticulosis   . GERD (gastroesophageal reflux disease)   . HTN (hypertension)   . Hx of colonic polyps     Patient Active Problem List   Diagnosis Date Noted  . HTN (hypertension) 11/19/2017  . GERD (gastroesophageal reflux disease) 02/27/2017  . Diverticulosis   . Colovaginal fistula 03/28/2016  . DIVERTICULOSIS OF COLON 07/30/2008  . DYSPHAGIA UNSPECIFIED 07/30/2008  . Anemia 05/11/2008  . History of colonic polyps 11/28/2007    Past Surgical History:  Procedure Laterality Date  . ABDOMINAL HYSTERECTOMY     TAH BSO  . APPENDECTOMY    . CHOLECYSTECTOMY    . FRACTURE SURGERY      OB History    Gravida  4   Para  4   Term      Preterm      AB      Living  4     SAB      TAB      Ectopic      Multiple      Live Births               Home Medications    Prior to Admission medications   Medication Sig Start Date  End Date Taking? Authorizing Provider  betamethasone dipropionate 0.05 % cream Apply topically 2 (two) times daily. 06/27/19   [name], MD  Ferrous Sulfate (IRON PO) Take by mouth daily.    [provider]  fish oil-omega-3 fatty acids 1000 MG capsule Take 1 g by mouth daily.     [provider]  furosemide (LASIX) 20 MG tablet Take 1 tablet (20 mg total) by mouth daily. 06/27/19   [name], MD  GINSENG PO Take by mouth daily.    [provider]  Multiple Vitamin (MULTIVITAMIN) tablet Take 1 tablet by mouth daily.    [provider]  omeprazole (PRILOSEC) 40 MG capsule Take 40 mg by mouth daily.    [provider]  Probiotic Product (PROBIOTIC DAILY PO) Take by mouth.    [provider]  vitamin C (ASCORBIC ACID) 500 MG tablet Take 500 mg by mouth daily. Takes only during winter months    [provider]    Family History Family History  Problem Relation Age of Onset  . Stroke Mother   . Colon cancer Father   . Stroke Father   .  Stroke Maternal Grandmother   . Breast cancer Paternal Grandmother     Social History Social History   Tobacco Use  . Smoking status: Never Smoker  . Smokeless tobacco: Never Used  Substance Use Topics  . Alcohol use: Yes    Comment: 4 oz weekly of wine  . Drug use: No     Allergies   Valium [diazepam]   Review of Systems Review of Systems   Physical Exam Triage Vital Signs ED Triage Vitals  Enc Vitals Group     BP 06/27/19 1742 (!) 154/73     Pulse Rate 06/27/19 1742 75     Resp 06/27/19 1742 18     Temp 06/27/19 1742 98.4 F (36.9 C)     Temp src --      SpO2 06/27/19 1742 97 %     Weight 06/27/19 1741 257 lb (116.6 kg)     Height --      Head Circumference --      Peak Flow --      Pain Score 06/27/19 1740 0     Pain Loc --      Pain Edu? --      Excl. in [place]? --    No data found.  Updated Vital Signs BP (!) 154/73 (BP Location: Right Arm)    Pulse 75   Temp 98.4 F (36.9 C)   Resp 18   Wt 116.6 kg   SpO2 97%   BMI 45.53 kg/m    Physical Exam Vitals signs and nursing note reviewed.  Constitutional:      Appearance: Normal appearance. She is obese.  Neck:     Musculoskeletal: Normal range of motion and neck supple.  Pulmonary:     Effort: Pulmonary effort is normal.  Musculoskeletal: Normal range of motion.     Right lower leg: Edema present.     Left lower leg: Edema present.     Comments: Patient has induration and thickening of skin distally and edema proximally in lower extremities without tenderness  Skin:    General: Skin is warm.     Findings: Rash present.  Neurological:     General: No focal deficit present.     Mental Status: She is alert.     Gait: Gait normal.  Psychiatric:        Mood and Affect: Mood normal.          UC Treatments / Results  Labs (all labs ordered are listed, but only abnormal results are displayed) Dermatology pathology report from 2016 reveals marked inflammation consistent with eczema  EKG   Radiology No results found.  Procedures Procedures (including critical care time)  Medications Ordered in UC Medications - No data to display  Initial Impression / Assessment and Plan / UC Course  I have reviewed the triage vital signs and the nursing notes.  Pertinent labs & imaging results that were available during my care of the patient were reviewed by me and considered in my medical decision making (see chart for details).    Final Clinical Impressions(s) / UC Diagnoses   Final diagnoses:  Intrinsic eczema     Discharge Instructions     Patient told not to wash feet frequently and never use alcohol, peroxide, or Epsom Salts on feet.  Wrap legs at night after applying the steroid cream.    ED Prescriptions    Medication Sig Dispense Auth. Provider   betamethasone dipropionate 0.05 % cream Apply topically 2 (two) times  daily. 45 g [name], MD    furosemide (LASIX) 20 MG tablet Take 1 tablet (20 mg total) by mouth daily. 30 tablet [name], MD     I have reviewed the PDMP during this encounter.   [name], MD 06/27/19 [phone]

## 2019-08-22 ENCOUNTER — Encounter (HOSPITAL_COMMUNITY): Payer: Self-pay

## 2019-08-22 ENCOUNTER — Other Ambulatory Visit: Payer: Self-pay

## 2019-08-22 ENCOUNTER — Ambulatory Visit (HOSPITAL_COMMUNITY)
Admission: EM | Admit: 2019-08-22 | Discharge: 2019-08-22 | Disposition: A | Discharge status: Home / Self Care | Payer: Medicare HMO | Attending: Emergency Medicine | Admitting: Emergency Medicine

## 2019-08-22 DIAGNOSIS — R609 Edema, unspecified: Secondary | ICD-10-CM

## 2019-08-22 DIAGNOSIS — L309 Dermatitis, unspecified: Secondary | ICD-10-CM

## 2019-08-22 MED ORDER — CEPHALEXIN 500 MG PO CAPS: 500.0000 mg | ORAL_CAPSULE | Freq: Four times a day (QID) | ORAL | 0 refills | Status: AC

## 2019-08-22 MED ORDER — BETAMETHASONE DIPROPIONATE 0.05 % EX CREA: TOPICAL_CREAM | Freq: Two times a day (BID) | CUTANEOUS | 0 refills | Status: DC

## 2019-08-22 NOTE — ED Triage Notes (Signed)
Pt presents with rash on both legs and feet with significant swelling & bleeding; pt states it is itchy & painful and has been a chronic issue for about a year.

## 2019-08-22 NOTE — Discharge Instructions (Addendum)
We will do a course of antibiotics due to the redness to your lower legs.  Continue with steroid cream.  We will stop the vaseline and see if calamine is more helpful with itching so you can limit the use of wipes and moisture to the skin.  I have placed a referral to dermatology with our Family medicine center for further recommendations.

## 2019-08-23 NOTE — ED Provider Notes (Signed)
[place]    CSN: [id number] Arrival date & time: 08/22/19  1732      History   Chief Complaint Chief Complaint  Patient presents with  . Rash  . Leg Swelling    HPI Ellen Johnson is a 83 y.o. female.   Ellen Johnson presents with complaints of persistent redness, itching, rash to bilateral lower legs. This has been ongoing for years now. She has seen her PCP and dermatology for this. Has tried various treatments, including unna boots. Most recently was here 10/17 and provided with steroid cream. No changes to symptoms. States she uses a plain, no fragrance wipe to wipe at her legs when they itch to limit scratching. Has had to use antibiotics in the past. Doesn't wear compression stockings. Has used lasix in the past as well which didn't improve her swelling. History  Of htn.    ROS per HPI, negative if not otherwise mentioned.      Past Medical History:  Diagnosis Date  . Anemia   . Diverticulosis   . GERD (gastroesophageal reflux disease)   . HTN (hypertension)   . Hx of colonic polyps     Patient Active Problem List   Diagnosis Date Noted  . HTN (hypertension) 11/19/2017  . GERD (gastroesophageal reflux disease) 02/27/2017  . Diverticulosis   . Colovaginal fistula 03/28/2016  . DIVERTICULOSIS OF COLON 07/30/2008  . DYSPHAGIA UNSPECIFIED 07/30/2008  . Anemia 05/11/2008  . History of colonic polyps 11/28/2007    Past Surgical History:  Procedure Laterality Date  . ABDOMINAL HYSTERECTOMY     TAH BSO  . APPENDECTOMY    . CHOLECYSTECTOMY    . FRACTURE SURGERY      OB History    Gravida  4   Para  4   Term      Preterm      AB      Living  4     SAB      TAB      Ectopic      Multiple      Live Births               Home Medications    Prior to Admission medications   Medication Sig Start Date End Date Taking? Authorizing Provider  betamethasone dipropionate 0.05 % cream Apply topically 2 (two) times daily.  08/22/19   Zigmund Gottron, NP  cephALEXin (KEFLEX) 500 MG capsule Take 1 capsule (500 mg total) by mouth 4 (four) times daily for 7 days. 08/22/19 08/29/19  Augusto Gamble B, NP  Ferrous Sulfate (IRON PO) Take by mouth daily.    [provider]  fish oil-omega-3 fatty acids 1000 MG capsule Take 1 g by mouth daily.     [provider]  furosemide (LASIX) 20 MG tablet Take 1 tablet (20 mg total) by mouth daily. 06/27/19   [name], MD  GINSENG PO Take by mouth daily.    [provider]  Multiple Vitamin (MULTIVITAMIN) tablet Take 1 tablet by mouth daily.    [provider]  omeprazole (PRILOSEC) 40 MG capsule Take 40 mg by mouth daily.    [provider]  Probiotic Product (PROBIOTIC DAILY PO) Take by mouth.    [provider]  vitamin C (ASCORBIC ACID) 500 MG tablet Take 500 mg by mouth daily. Takes only during winter months    [provider]    Family History Family History  Problem Relation Age of  Onset  . Stroke Mother   . Colon cancer Father   . Stroke Father   . Stroke Maternal Grandmother   . Breast cancer Paternal Grandmother     Social History Social History   Tobacco Use  . Smoking status: Never Smoker  . Smokeless tobacco: Never Used  Substance Use Topics  . Alcohol use: Yes    Comment: 4 oz weekly of wine  . Drug use: No     Allergies   Valium [diazepam]   Review of Systems Review of Systems   Physical Exam Triage Vital Signs ED Triage Vitals  Enc Vitals Group     BP 08/22/19 1749 129/81     Pulse Rate 08/22/19 1749 86     Resp 08/22/19 1749 17     Temp 08/22/19 1749 98.4 F (36.9 C)     Temp Source 08/22/19 1749 Oral     SpO2 08/22/19 1749 96 %     Weight --      Height --      Head Circumference --      Peak Flow --      Pain Score 08/22/19 1750 10     Pain Loc --      Pain Edu? --      Excl. in [place]? --    No data found.  Updated Vital Signs BP 129/81 (BP Location:  Right Arm)   Pulse 86   Temp 98.4 F (36.9 C) (Oral)   Resp 17   SpO2 96%   Visual Acuity Right Eye Distance:   Left Eye Distance:   Bilateral Distance:    Right Eye Near:   Left Eye Near:    Bilateral Near:     Physical Exam Constitutional:      General: She is not in acute distress.    Appearance: She is well-developed.  Cardiovascular:     Rate and Rhythm: Normal rate.  Pulmonary:     Effort: Pulmonary effort is normal.  Skin:    General: Skin is warm and dry.     Comments: Redness excoriated and bleeding lower legs/ ankles; no new numbness or tingling; see photos, appear similar to last visit in October; appears to have scratch marks   Neurological:     Mental Status: She is alert and oriented to person, place, and time.          UC Treatments / Results  Labs (all labs ordered are listed, but only abnormal results are displayed) Labs Reviewed - No data to display  EKG   Radiology No results found.  Procedures Procedures (including critical care time)  Medications Ordered in UC Medications - No data to display  Initial Impression / Assessment and Plan / UC Course  I have reviewed the triage vital signs and the nursing notes.  Pertinent labs & imaging results that were available during my care of the patient were reviewed by me and considered in my medical decision making (see chart for details).     Keflex initiated as there is a fair amount of redness and firmness around the oozing sites where it seems that she might be scratching. She is constantly wiping with moist wipes, question if this is worsening skin breakdown?  Will try stopping this and using calamine for the itching as well as betamethasone? Risk of this being too dry, but has not changed in a few years now so opted to try this change. Encouraged to continue to work with dermatology, patient hesitant  to return to [facility] dermatology due to cost, referral to [facility]  dermatology placed. Return precautions provided. Patient verbalized understanding and agreeable to plan.   Final Clinical Impressions(s) / UC Diagnoses   Final diagnoses:  Peripheral edema  Dermatitis     Discharge Instructions     We will do a course of antibiotics due to the redness to your lower legs.  Continue with steroid cream.  We will stop the vaseline and see if calamine is more helpful with itching so you can limit the use of wipes and moisture to the skin.  I have placed a referral to dermatology with our [facility] for further recommendations.    ED Prescriptions    Medication Sig Dispense Auth. Provider   betamethasone dipropionate 0.05 % cream Apply topically 2 (two) times daily. 45 g Augusto Gamble B, NP   cephALEXin (KEFLEX) 500 MG capsule Take 1 capsule (500 mg total) by mouth 4 (four) times daily for 7 days. 28 capsule Zigmund Gottron, NP     PDMP not reviewed this encounter.   Zigmund Gottron, NP 08/23/19 1008

## 2019-09-21 ENCOUNTER — Encounter (HOSPITAL_COMMUNITY): Payer: Self-pay

## 2019-09-21 ENCOUNTER — Other Ambulatory Visit: Payer: Self-pay

## 2019-09-21 ENCOUNTER — Ambulatory Visit (HOSPITAL_COMMUNITY)
Admission: EM | Admit: 2019-09-21 | Discharge: 2019-09-21 | Disposition: A | Discharge status: Home / Self Care | Payer: Medicare HMO | Attending: Family Medicine | Admitting: Family Medicine

## 2019-09-21 DIAGNOSIS — R030 Elevated blood-pressure reading, without diagnosis of hypertension: Secondary | ICD-10-CM

## 2019-09-21 DIAGNOSIS — I872 Venous insufficiency (chronic) (peripheral): Secondary | ICD-10-CM | POA: Diagnosis not present

## 2019-09-21 DIAGNOSIS — R600 Localized edema: Secondary | ICD-10-CM | POA: Diagnosis not present

## 2019-09-21 MED ORDER — TORSEMIDE 10 MG PO TABS: 10.0000 mg | ORAL_TABLET | Freq: Every day | ORAL | 1 refills | Status: DC

## 2019-09-21 MED ORDER — SARNA 0.5-0.5 % EX LOTN: 1.0000 "application " | TOPICAL_LOTION | CUTANEOUS | 0 refills | Status: DC | PRN

## 2019-09-21 NOTE — ED Provider Notes (Signed)
[name]    CSN: [id number] Arrival date & time: 09/21/19  1621      History   Chief Complaint Chief Complaint  Patient presents with  . Rash    HPI BULAR Ellen Johnson is a 84 y.o. female.   HPI Patient has chronic venous stasis and edema of both ankles This itches terribly and it is complicated by the fact that she scratches her skin open on a regular basis She has seen, by her report, 4 different dermatologist She brings in a list today and wants to see a rheumatologist, neurologist, another dermatologist, and insists that there is something wrong with her "interior". She does not have a primary care doctor.  We discussed with her the importance of having internal medicine doctor that will see her, work up her complaints, and refer her as appropriate.  She is given the name of Ellen Gosling, MD and agrees to give her a call. Today she has a baggy with all the different products that she is using.  She is using clobetasol on her rash.  She tells [name] that she uses a baby diaper cream on her ankles at bedtime.  She wraps them with gauze.  This helps her to sleep better I see her with [name].  We both agree that this is a good plan predominantly because of a keep her from scratching her ankles.  We want her to use an antiitch cream during the day, again, to keep her ankles moisturized and to keep her from scratching. She has 2-3+ edema in spite of using her Lasix.  We will change her to [facility] The importance of elevating her ankles is again reinforced to her  Past Medical History:  Diagnosis Date  . Anemia   . Diverticulosis   . GERD (gastroesophageal reflux disease)   . HTN (hypertension)   . Hx of colonic polyps     Patient Active Problem List   Diagnosis Date Noted  . HTN (hypertension) 11/19/2017  . GERD (gastroesophageal reflux disease) 02/27/2017  . Diverticulosis   . Colovaginal fistula 03/28/2016  . DIVERTICULOSIS OF COLON 07/30/2008  . DYSPHAGIA  UNSPECIFIED 07/30/2008  . Anemia 05/11/2008  . History of colonic polyps 11/28/2007    Past Surgical History:  Procedure Laterality Date  . ABDOMINAL HYSTERECTOMY     TAH BSO  . APPENDECTOMY    . CHOLECYSTECTOMY    . FRACTURE SURGERY      OB History    Gravida  4   Para  4   Term      Preterm      AB      Living  4     SAB      TAB      Ectopic      Multiple      Live Births               Home Medications    Prior to Admission medications   Medication Sig Start Date End Date Taking? Authorizing Provider  betamethasone dipropionate 0.05 % cream Apply topically 2 (two) times daily. 08/22/19   Zigmund Gottron, NP  camphor-menthol (SARNA) lotion Apply 1 application topically as needed for itching. APPLY EVERY MORNING 09/21/19   [name], MD  Ferrous Sulfate (IRON PO) Take by mouth daily.    [provider]  fish oil-omega-3 fatty acids 1000 MG capsule Take 1 g by mouth daily.     [provider]  Posey Pronto  PO Take by mouth daily.    [provider]  Multiple Vitamin (MULTIVITAMIN) tablet Take 1 tablet by mouth daily.    [provider]  omeprazole (PRILOSEC) 40 MG capsule Take 40 mg by mouth daily.    [provider]  Probiotic Product (PROBIOTIC DAILY PO) Take by mouth.    [provider]  torsemide (DEMADEX) 10 MG tablet Take 1 tablet (10 mg total) by mouth daily. 09/21/19   [name], MD  vitamin C (ASCORBIC ACID) 500 MG tablet Take 500 mg by mouth daily. Takes only during winter months    [provider]  furosemide (LASIX) 20 MG tablet Take 1 tablet (20 mg total) by mouth daily. 06/27/19 09/21/19  [name], MD    Family History Family History  Problem Relation Age of Onset  . Stroke Mother   . Colon cancer Father   . Stroke Father   . Stroke Maternal Grandmother   . Breast cancer Paternal Grandmother     Social History Social History   Tobacco Use  .  Smoking status: Never Smoker  . Smokeless tobacco: Never Used  Substance Use Topics  . Alcohol use: Yes    Comment: 4 oz weekly of wine  . Drug use: No     Allergies   Valium [diazepam]   Review of Systems Review of Systems  Cardiovascular: Positive for leg swelling.  Skin: Positive for color change and rash.  Psychiatric/Behavioral: Positive for sleep disturbance.    Physical Exam Triage Vital Signs ED Triage Vitals  Enc Vitals Group     BP 09/21/19 1643 (!) 162/86     Pulse Rate 09/21/19 1643 86     Resp 09/21/19 1643 18     Temp 09/21/19 1643 98.2 F (36.8 C)     Temp Source 09/21/19 1643 Oral     SpO2 09/21/19 1643 97 %     Weight --      Height --      Head Circumference --      Peak Flow --      Pain Score 09/21/19 1641 0     Pain Loc --      Pain Edu? --      Excl. in [place]? --    No data found.  Updated Vital Signs BP (!) 162/86 (BP Location: Right Arm)   Pulse 86   Temp 98.2 F (36.8 C) (Oral)   Resp 18   SpO2 97%      Physical Exam Constitutional:      General: She is not in acute distress.    Appearance: She is well-developed. She is obese.  HENT:     Head: Normocephalic and atraumatic.     Mouth/Throat:     Comments: Mask in place Eyes:     Conjunctiva/sclera: Conjunctivae normal.     Pupils: Pupils are equal, round, and reactive to light.  Cardiovascular:     Rate and Rhythm: Normal rate.     Pulses: Normal pulses.  Pulmonary:     Effort: Pulmonary effort is normal. No respiratory distress.  Musculoskeletal:        General: Normal range of motion.     Cervical back: Normal range of motion.     Right lower leg: Edema present.     Left lower leg: Edema present.     Comments: Deep pitting ankle edema  Skin:    General: Skin is warm and dry.     Findings: Lesion and  rash present.     Comments: Skin of the lower ankles is hypertrophic, hyperpigmented, indurated, and covered with excoriations  Neurological:     Mental Status: She is  alert.  Psychiatric:     Comments: Talkative      UC Treatments / Results  Labs (all labs ordered are listed, but only abnormal results are displayed) Labs Reviewed - No data to display  EKG   Radiology No results found.  Procedures Procedures (including critical care time)  Medications Ordered in UC Medications - No data to display  Initial Impression / Assessment and Plan / UC Course  I have reviewed the triage vital signs and the nursing notes.  Pertinent labs & imaging results that were available during my care of the patient were reviewed by me and considered in my medical decision making (see chart for details).     Pitting edema, not well controlled, stasis dermatitis, persists in spite of multiple specialist and treatment Blood pressure is elevated today See plan Final Clinical Impressions(s) / UC Diagnoses   Final diagnoses:  Stasis dermatitis of both legs  Single episode of elevated blood pressure  Pedal edema     Discharge Instructions     SARNA ORIGINAL LOTION - HAS MENTHOL USE EVERY MORNING - MAY REPEAT AS NEEDED AT NIGHT USE BABY DIAPER CREAM AND WRAP Do not scratch STOP LASIX-FUROSEMIDE START THE TORSEMIDE - DEMADEX ELEVATE LEGS WHEN YOU ARE HOME CALL STACY BURNS TOMORROW TO SET UP AN APPOINTMENT    ED Prescriptions    Medication Sig Dispense Auth. Provider   torsemide (DEMADEX) 10 MG tablet Take 1 tablet (10 mg total) by mouth daily. 30 tablet [name], MD   camphor-menthol [facility]) lotion Apply 1 application topically as needed for itching. APPLY EVERY MORNING 222 mL [name], MD     PDMP not reviewed this encounter.   [name], MD 09/21/19 2117

## 2019-09-21 NOTE — Discharge Instructions (Addendum)
SARNA ORIGINAL LOTION - HAS MENTHOL USE EVERY MORNING - MAY REPEAT AS NEEDED AT NIGHT USE BABY DIAPER CREAM AND WRAP Do not scratch STOP LASIX-FUROSEMIDE START THE TORSEMIDE - DEMADEX ELEVATE LEGS WHEN YOU ARE HOME CALL STACY BURNS TOMORROW TO SET UP AN APPOINTMENT

## 2019-09-21 NOTE — ED Triage Notes (Signed)
Pt here for follow-up on chronic bilateral foot rash, here to see [name].

## 2019-09-29 ENCOUNTER — Telehealth: Payer: Self-pay

## 2019-09-29 NOTE — Telephone Encounter (Signed)
Would you be willing to accept her as a TOC from Dr Mitchel Honour?   Copied from [place] [id number]. Topic: Appointment Scheduling - Scheduling Inquiry for Clinic >> Sep 29, 2019  4:35 PM Nils Flack, Marland Kitchen wrote: Reason for CRM: pt would like to be established with Dr Quay Burow.  She is recommended by Roger Shelter  and Coralie Keens  [phone] Please call pt if she is not accepted so she can find someone else to go to

## 2019-09-30 ENCOUNTER — Telehealth: Payer: Self-pay | Admitting: *Deleted

## 2019-09-30 NOTE — Telephone Encounter (Signed)
I am not currently accepting new patients.  °

## 2019-09-30 NOTE — Telephone Encounter (Signed)
Copied from [place] [id number]. Topic: General - Other >> Sep 30, 2019 10:30 AM Leward Quan A wrote: Reason for CRM: patient would like to be established with a doctor here at this practice.  She is recommended by Roger Shelter  and Coralie Keens. Her chart shows that she was dismissed back in 2012 from this location and she is asking if she could please come back. Please call patient with and answer at Ph# [phone]

## 2019-09-30 NOTE — Telephone Encounter (Signed)
LVM for patient advising that Dr Quay Burow is not accepting new patients at this time.

## 2019-09-30 NOTE — Telephone Encounter (Signed)
I don't see anywhere where she's been seen here or dismissed from [place].

## 2019-10-02 NOTE — Telephone Encounter (Signed)
That is fine, we can try again. Thanks, BJ

## 2019-10-02 NOTE — Telephone Encounter (Signed)
Okay to schedule new pt appt with Dr. Martinique, thanks!

## 2019-10-05 DIAGNOSIS — I872 Venous insufficiency (chronic) (peripheral): Secondary | ICD-10-CM | POA: Diagnosis not present

## 2019-10-05 DIAGNOSIS — I878 Other specified disorders of veins: Secondary | ICD-10-CM | POA: Diagnosis not present

## 2019-10-05 DIAGNOSIS — R030 Elevated blood-pressure reading, without diagnosis of hypertension: Secondary | ICD-10-CM | POA: Diagnosis not present

## 2019-10-05 DIAGNOSIS — B369 Superficial mycosis, unspecified: Secondary | ICD-10-CM | POA: Diagnosis not present

## 2019-10-08 ENCOUNTER — Telehealth: Payer: Self-pay | Admitting: General Practice

## 2019-10-08 NOTE — Telephone Encounter (Signed)
LMVM for the patient to contact the office to schedule a new patient appointment with Dr. Jordan 

## 2019-10-26 ENCOUNTER — Ambulatory Visit: Payer: Medicare HMO | Attending: Internal Medicine

## 2019-10-26 ENCOUNTER — Other Ambulatory Visit: Payer: Self-pay

## 2019-10-26 DIAGNOSIS — Z23 Encounter for immunization: Secondary | ICD-10-CM

## 2019-10-26 NOTE — Progress Notes (Signed)
   Covid-19 Vaccination Clinic  Name:  Ellen Johnson    MRN: [id number] DOB: 05-02-1936  10/26/2019  [name] was observed post Covid-19 immunization for 15 minutes without incidence. She was provided with Vaccine Information Sheet and instruction to access the V-Safe system.   [name] was instructed to call 911 with any severe reactions post vaccine: Marland Kitchen Difficulty breathing  . Swelling of your face and throat  . A fast heartbeat  . A bad rash all over your body  . Dizziness and weakness    Immunizations Administered    Name Date Dose VIS Date Route   Moderna COVID-19 Vaccine 10/26/2019 12:48 PM 0.5 mL 08/11/2019 Intramuscular   Manufacturer: Moderna   Lot: [id number]   [id number]

## 2019-11-11 DIAGNOSIS — L989 Disorder of the skin and subcutaneous tissue, unspecified: Secondary | ICD-10-CM | POA: Diagnosis not present

## 2019-11-11 DIAGNOSIS — D485 Neoplasm of uncertain behavior of skin: Secondary | ICD-10-CM | POA: Diagnosis not present

## 2019-11-11 DIAGNOSIS — Z85828 Personal history of other malignant neoplasm of skin: Secondary | ICD-10-CM | POA: Diagnosis not present

## 2019-11-11 DIAGNOSIS — Z5181 Encounter for therapeutic drug level monitoring: Secondary | ICD-10-CM | POA: Diagnosis not present

## 2019-11-11 DIAGNOSIS — L409 Psoriasis, unspecified: Secondary | ICD-10-CM | POA: Diagnosis not present

## 2019-11-17 DIAGNOSIS — L409 Psoriasis, unspecified: Secondary | ICD-10-CM | POA: Diagnosis not present

## 2019-11-17 DIAGNOSIS — Z79899 Other long term (current) drug therapy: Secondary | ICD-10-CM | POA: Diagnosis not present

## 2019-11-17 DIAGNOSIS — Z85828 Personal history of other malignant neoplasm of skin: Secondary | ICD-10-CM | POA: Diagnosis not present

## 2019-11-17 DIAGNOSIS — Z5181 Encounter for therapeutic drug level monitoring: Secondary | ICD-10-CM | POA: Diagnosis not present

## 2019-11-17 DIAGNOSIS — C44519 Basal cell carcinoma of skin of other part of trunk: Secondary | ICD-10-CM | POA: Diagnosis not present

## 2019-11-24 ENCOUNTER — Ambulatory Visit: Payer: Medicare HMO | Attending: Internal Medicine

## 2019-11-24 DIAGNOSIS — Z23 Encounter for immunization: Secondary | ICD-10-CM

## 2019-11-24 NOTE — Progress Notes (Signed)
   Covid-19 Vaccination Clinic  Name:  Ellen Johnson    MRN: [id number] DOB: 07/10/1936  11/24/2019  Ellen Johnson was observed post Covid-19 immunization for 15 minutes without incident. She was provided with Vaccine Information Sheet and instruction to access the V-Safe system.   Ellen Johnson was instructed to call 911 with any severe reactions post vaccine: Marland Kitchen Difficulty breathing  . Swelling of face and throat  . A fast heartbeat  . A bad rash all over body  . Dizziness and weakness   Immunizations Administered    Name Date Dose VIS Date Route   Moderna COVID-19 Vaccine 11/24/2019 12:54 PM 0.5 mL 08/11/2019 Intramuscular   Manufacturer: Moderna   Lot: [id number]   [id number]

## 2019-12-08 DIAGNOSIS — C44519 Basal cell carcinoma of skin of other part of trunk: Secondary | ICD-10-CM | POA: Diagnosis not present

## 2019-12-08 DIAGNOSIS — L409 Psoriasis, unspecified: Secondary | ICD-10-CM | POA: Diagnosis not present

## 2019-12-08 DIAGNOSIS — Z5181 Encounter for therapeutic drug level monitoring: Secondary | ICD-10-CM | POA: Diagnosis not present

## 2019-12-08 DIAGNOSIS — Z85828 Personal history of other malignant neoplasm of skin: Secondary | ICD-10-CM | POA: Diagnosis not present

## 2020-01-05 DIAGNOSIS — Z79899 Other long term (current) drug therapy: Secondary | ICD-10-CM | POA: Diagnosis not present

## 2020-01-05 DIAGNOSIS — L409 Psoriasis, unspecified: Secondary | ICD-10-CM | POA: Diagnosis not present

## 2020-05-10 ENCOUNTER — Emergency Department (HOSPITAL_COMMUNITY): Payer: Medicare HMO

## 2020-05-10 ENCOUNTER — Ambulatory Visit (HOSPITAL_COMMUNITY)
Admission: EM | Admit: 2020-05-10 | Discharge: 2020-05-10 | Disposition: A | Discharge status: Home / Self Care | Payer: Medicare HMO

## 2020-05-10 ENCOUNTER — Other Ambulatory Visit: Payer: Self-pay

## 2020-05-10 ENCOUNTER — Encounter (HOSPITAL_COMMUNITY): Payer: Self-pay

## 2020-05-10 ENCOUNTER — Emergency Department (HOSPITAL_COMMUNITY)
Admission: EM | Admit: 2020-05-10 | Discharge: 2020-05-10 | Disposition: A | Discharge status: Home / Self Care | Payer: Medicare HMO | Attending: Emergency Medicine | Admitting: Emergency Medicine

## 2020-05-10 DIAGNOSIS — Y999 Unspecified external cause status: Secondary | ICD-10-CM | POA: Insufficient documentation

## 2020-05-10 DIAGNOSIS — S0003XA Contusion of scalp, initial encounter: Secondary | ICD-10-CM | POA: Insufficient documentation

## 2020-05-10 DIAGNOSIS — Y9389 Activity, other specified: Secondary | ICD-10-CM | POA: Diagnosis not present

## 2020-05-10 DIAGNOSIS — S42292A Other displaced fracture of upper end of left humerus, initial encounter for closed fracture: Secondary | ICD-10-CM | POA: Diagnosis not present

## 2020-05-10 DIAGNOSIS — S42202A Unspecified fracture of upper end of left humerus, initial encounter for closed fracture: Secondary | ICD-10-CM | POA: Insufficient documentation

## 2020-05-10 DIAGNOSIS — S42252A Displaced fracture of greater tuberosity of left humerus, initial encounter for closed fracture: Secondary | ICD-10-CM | POA: Diagnosis not present

## 2020-05-10 DIAGNOSIS — S42001A Fracture of unspecified part of right clavicle, initial encounter for closed fracture: Secondary | ICD-10-CM | POA: Diagnosis not present

## 2020-05-10 DIAGNOSIS — Y92002 Bathroom of unspecified non-institutional (private) residence single-family (private) house as the place of occurrence of the external cause: Secondary | ICD-10-CM | POA: Insufficient documentation

## 2020-05-10 DIAGNOSIS — S82015A Nondisplaced osteochondral fracture of left patella, initial encounter for closed fracture: Secondary | ICD-10-CM | POA: Diagnosis not present

## 2020-05-10 DIAGNOSIS — I10 Essential (primary) hypertension: Secondary | ICD-10-CM | POA: Diagnosis not present

## 2020-05-10 DIAGNOSIS — W19XXXA Unspecified fall, initial encounter: Secondary | ICD-10-CM | POA: Insufficient documentation

## 2020-05-10 DIAGNOSIS — S4992XA Unspecified injury of left shoulder and upper arm, initial encounter: Secondary | ICD-10-CM | POA: Diagnosis present

## 2020-05-10 DIAGNOSIS — S199XXA Unspecified injury of neck, initial encounter: Secondary | ICD-10-CM | POA: Diagnosis not present

## 2020-05-10 DIAGNOSIS — S42212A Unspecified displaced fracture of surgical neck of left humerus, initial encounter for closed fracture: Secondary | ICD-10-CM | POA: Diagnosis not present

## 2020-05-10 DIAGNOSIS — Z79899 Other long term (current) drug therapy: Secondary | ICD-10-CM | POA: Insufficient documentation

## 2020-05-10 DIAGNOSIS — S5002XA Contusion of left elbow, initial encounter: Secondary | ICD-10-CM | POA: Diagnosis not present

## 2020-05-10 DIAGNOSIS — M47812 Spondylosis without myelopathy or radiculopathy, cervical region: Secondary | ICD-10-CM | POA: Diagnosis not present

## 2020-05-10 DIAGNOSIS — S52042A Displaced fracture of coronoid process of left ulna, initial encounter for closed fracture: Secondary | ICD-10-CM | POA: Diagnosis not present

## 2020-05-10 DIAGNOSIS — G319 Degenerative disease of nervous system, unspecified: Secondary | ICD-10-CM | POA: Diagnosis not present

## 2020-05-10 DIAGNOSIS — Z20822 Contact with and (suspected) exposure to covid-19: Secondary | ICD-10-CM | POA: Insufficient documentation

## 2020-05-10 DIAGNOSIS — S42352A Displaced comminuted fracture of shaft of humerus, left arm, initial encounter for closed fracture: Secondary | ICD-10-CM | POA: Diagnosis not present

## 2020-05-10 DIAGNOSIS — M439 Deforming dorsopathy, unspecified: Secondary | ICD-10-CM | POA: Diagnosis not present

## 2020-05-10 LAB — SARS CORONAVIRUS 2 BY RT PCR (HOSPITAL ORDER, PERFORMED IN ~~LOC~~ HOSPITAL LAB): SARS Coronavirus 2: NEGATIVE

## 2020-05-10 MED ORDER — ONDANSETRON 4 MG PO TBDP: 4.0000 mg | ORAL_TABLET | Freq: Three times a day (TID) | ORAL | 0 refills | Status: DC | PRN

## 2020-05-10 MED ORDER — HYDROCODONE-ACETAMINOPHEN 5-325 MG PO TABS
1.0000 | ORAL_TABLET | Freq: Four times a day (QID) | ORAL | 0 refills | Status: DC | PRN
Start: 2020-05-10 — End: 2022-04-16

## 2020-05-10 MED ORDER — MORPHINE SULFATE (PF) 4 MG/ML IV SOLN
4.0000 mg | Freq: Once | INTRAVENOUS | Status: AC
  Administered 2020-05-10: 4 mg via INTRAVENOUS
  Filled 2020-05-10: qty 1

## 2020-05-10 MED ORDER — ONDANSETRON 4 MG PO TBDP: 4.0000 mg | ORAL_TABLET | Freq: Once | ORAL | Status: DC

## 2020-05-10 MED ORDER — ONDANSETRON HCL 4 MG/2ML IJ SOLN
4.0000 mg | Freq: Once | INTRAMUSCULAR | Status: AC
  Administered 2020-05-10: 4 mg via INTRAVENOUS
  Filled 2020-05-10: qty 2

## 2020-05-10 MED ORDER — OXYCODONE-ACETAMINOPHEN 5-325 MG PO TABS: 1.0000 | ORAL_TABLET | Freq: Once | ORAL | Status: DC

## 2020-05-10 NOTE — ED Triage Notes (Signed)
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 Patient reports that she hit head, no loc and complains of left arm pain. No obvious deformity

## 2020-05-10 NOTE — Consult Note (Signed)
Reason for Consult:Left humerus/elbow fxs Referring Physician: A Curatolo  Ellen Johnson is an 84 y.o. female.  HPI: Ellen Johnson was giving a bath to an Alzheimer's pt who began to fall. She tried to catch him and fell herself. She had immediate left shoulder and elbow pain and came to the ED for evaluation. X-rays showed a prox humerus fx and small avulsion fxs in the elbow and orthopedic surgery was consulted. She is RHD.  Past Medical History:  Diagnosis Date   Anemia    Diverticulosis    GERD (gastroesophageal reflux disease)    HTN (hypertension)    Hx of colonic polyps     Past Surgical History:  Procedure Laterality Date   ABDOMINAL HYSTERECTOMY     TAH BSO   APPENDECTOMY     CHOLECYSTECTOMY     FRACTURE SURGERY      Family History  Problem Relation Age of Onset   Stroke Mother    Colon cancer Father    Stroke Father    Stroke Maternal Grandmother    Breast cancer Paternal Grandmother     Social History:  reports that she has never smoked. She has never used smokeless tobacco. She reports current alcohol use. She reports that she does not use drugs.  Allergies:  Allergies  Allergen Reactions   Valium [Diazepam] Other (See Comments)    Totally different personality    Medications: I have reviewed the patient's current medications.  No results found for this or any previous visit (from the past 48 hour(s)).  DG Forearm Left  Result Date: 05/10/2020 CLINICAL DATA:  Fall.  Pain. EXAM: LEFT FOREARM - 2 VIEW COMPARISON:  No prior. FINDINGS: Minimally displaced fracture of the proximal radial neck cannot be excluded. A slight displaced fracture of the tip of the coronoid process of the ulna noted. Diffuse osteopenia and degenerative change. Punctate dystrophic calcifications noted in the soft tissues. IMPRESSION: Minimally displaced fracture of the proximal radial neck cannot be excluded. A slightly displaced fracture of the tip of the coronoid process of  the ulna noted. Electronically Signed   By: Marcello Moores  Register   On: 05/10/2020 12:57   DG Humerus Left  Result Date: 05/10/2020 CLINICAL DATA:  Fall.  Pain. EXAM: LEFT HUMERUS - 2+ VIEW COMPARISON:  Chest x-ray 07/18/2016. FINDINGS: Comminuted fracture of the proximal left humerus noted. Displaced fracture of the greater tuberosity noted. No evidence of dislocation. Diffuse osteopenia degenerative change. Calcific densities noted over supraspinatus tendon consistent calcific supraspinatus tendinosis. IMPRESSION: 1. Comminuted fracture of the proximal left humerus. Displaced fracture of the greater tuberosity. 2. Diffuse osteopenia degenerative change. Calcific supraspinatus tendinosis. Electronically Signed   By: Marcello Moores  Register   On: 05/10/2020 13:00    Review of Systems  HENT: Negative for ear discharge, ear pain, hearing loss and tinnitus.   Eyes: Negative for photophobia and pain.  Respiratory: Negative for cough and shortness of breath.   Cardiovascular: Negative for chest pain.  Gastrointestinal: Negative for abdominal pain, nausea and vomiting.  Genitourinary: Negative for dysuria, flank pain, frequency and urgency.  Musculoskeletal: Positive for arthralgias (Left shoulder/elbow). Negative for back pain, myalgias and neck pain.  Neurological: Negative for dizziness and headaches.  Hematological: Does not bruise/bleed easily.  Psychiatric/Behavioral: The patient is not nervous/anxious.    Blood pressure (!) 154/79, pulse 73, temperature (!) 97.5 F (36.4 C), temperature source Oral, resp. rate 14, height 5\' 5"  (1.651 m), weight 72.6 kg, SpO2 96 %. Physical Exam Constitutional:  General: She is not in acute distress.    Appearance: She is well-developed. She is not diaphoretic.  HENT:     Head: Normocephalic and atraumatic.  Eyes:     General: No scleral icterus.       Right eye: No discharge.        Left eye: No discharge.     Conjunctiva/sclera: Conjunctivae normal.   Cardiovascular:     Rate and Rhythm: Normal rate and regular rhythm.  Pulmonary:     Effort: Pulmonary effort is normal. No respiratory distress.  Musculoskeletal:     Cervical back: Normal range of motion.     Comments: Left shoulder, elbow, wrist, digits- no skin wounds, mod-severe TTP shoulder/elbow, no instability, no blocks to motion  Sens  Ax/R/M/U intact  Mot   Ax/ R/ PIN/ M/ AIN/ U intact  Rad 2+  Skin:    General: Skin is warm and dry.  Neurological:     Mental Status: She is alert.  Psychiatric:        Behavior: Behavior normal.     Assessment/Plan: Left humerus/elbow fxs -- Plan initial non-operative treatment in splint/sling, NWB. F/u with Dr. Stann Mainland in 1-2 weeks for repeat imaging.    Lisette Abu, PA-C Orthopedic Surgery [phone] 05/10/2020, 3:20 PM

## 2020-05-10 NOTE — ED Provider Notes (Signed)
[facility] EMERGENCY DEPARTMENT Provider Note   CSN: [id number] Arrival date & time: 05/10/20  1147     History No chief complaint on file.   Ellen Johnson is a 84 y.o. female has been observed diverticulosis, GERD, hypertension who presents for evaluation of left arm pain after mechanical fall that occurred earlier this morning at approximately 9 AM.  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  She reports that she did hit her head.  No LOC.  She is not on blood thinners.  She reports that she also landed on her left upper extremity.  Since then, she has had pain to the left upper extremity and difficulty moving it.  She states she has been able to ambulate and bear weight on her leg since this happened.  She states that the pain is worse in her arm whenever she tries to move it.  She denies any numbness/weakness.  Denies any preceding chest pain or dizziness.  The history is provided by the patient.       Past Medical History:  Diagnosis Date  . Anemia   . Diverticulosis   . GERD (gastroesophageal reflux disease)   . HTN (hypertension)   . Hx of colonic polyps     Patient Active Problem List   Diagnosis Date Noted  . HTN (hypertension) 11/19/2017  . GERD (gastroesophageal reflux disease) 02/27/2017  . Diverticulosis   . Colovaginal fistula 03/28/2016  . DIVERTICULOSIS OF COLON 07/30/2008  . DYSPHAGIA UNSPECIFIED 07/30/2008  . Anemia 05/11/2008  . History of colonic polyps 11/28/2007    Past Surgical History:  Procedure Laterality Date  . ABDOMINAL HYSTERECTOMY     TAH BSO  . APPENDECTOMY    . CHOLECYSTECTOMY    . FRACTURE SURGERY       OB History    Gravida  4   Para  4   Term      Preterm      AB      Living  4     SAB      TAB      Ectopic      Multiple      Live Births               Family History  Problem Relation Age of Onset  . Stroke Mother   . Colon cancer Father   . Stroke Father   . Stroke Maternal Grandmother   . Breast cancer Paternal Grandmother     Social History   Tobacco Use  . Smoking status: Never Smoker  . Smokeless tobacco: Never Used  Vaping Use  . Vaping Use: Never used  Substance Use Topics  . Alcohol use: Yes    Comment: 4 oz weekly of wine  . Drug use: No    Home Medications Prior to Admission medications   Medication Sig Start Date End Date Taking? Authorizing Provider  betamethasone dipropionate 0.05 % cream Apply topically 2 (two) times daily. 08/22/19   Zigmund Gottron, NP  camphor-menthol (SARNA) lotion Apply 1 application topically as needed for itching. APPLY EVERY MORNING 09/21/19   [name], MD  Ferrous Sulfate (IRON PO) Take by mouth daily.    [provider]  fish oil-omega-3 fatty acids 1000 MG capsule Take 1 g by mouth daily.     [provider]  GINSENG PO Take by mouth daily.    [provider]  HYDROcodone-acetaminophen (NORCO/VICODIN) 5-325 MG tablet Take 1-2 tablets by mouth every 6 (six) hours as needed. 05/10/20   Volanda Napoleon, PA-C  Multiple Vitamin (MULTIVITAMIN) tablet Take 1 tablet by mouth daily.    [provider]  omeprazole (PRILOSEC) 40 MG capsule Take 40 mg by mouth daily.    [provider]  ondansetron (ZOFRAN ODT) 4 MG disintegrating tablet Take 1 tablet (4 mg total) by mouth every 8 (eight) hours as needed for nausea or vomiting. 05/10/20   Providence Lanius A, PA-C  Probiotic Product (PROBIOTIC DAILY PO) Take by mouth.    [provider]  torsemide (DEMADEX) 10 MG tablet Take 1 tablet (10 mg total) by mouth daily. 09/21/19   [name], MD  vitamin C (ASCORBIC ACID) 500 MG tablet Take 500 mg by mouth daily. Takes only during winter months    [provider]  furosemide (LASIX) 20 MG tablet Take 1 tablet (20 mg total)  by mouth daily. 06/27/19 09/21/19  [name], MD    Allergies    Valium [diazepam]  Review of Systems   Review of Systems  Eyes: Negative for visual disturbance.  Cardiovascular: Negative for chest pain.  Musculoskeletal: Negative for neck pain.       LUE pain  Neurological: Negative for dizziness, weakness and numbness.  All other systems reviewed and are negative.   Physical Exam Updated Vital Signs BP (!) 173/144 (BP Location: Right Arm)   Pulse 84   Temp 97.8 F (36.6 C) (Oral)   Resp 16   Ht 5\' 5"  (1.651 m)   Wt 72.6 kg   SpO2 95%   BMI 26.63 kg/m   Physical Exam Vitals and nursing note reviewed.  Constitutional:      Appearance: She is well-developed.  HENT:     Head: Normocephalic.      Comments: Small 3 cm area of ecchymosis and tenderness noted to the left lateral head.  Eyes:     General: No scleral icterus.       Right eye: No discharge.        Left eye: No discharge.     Conjunctiva/sclera: Conjunctivae normal.     Comments: PERRL. EOMs intact. No nystagmus. No neglect.   Neck:     Comments: Full flexion/extension and lateral movement of neck fully intact. No bony midline tenderness. No deformities or crepitus.  Cardiovascular:     Pulses:          Radial pulses are 2+ on the right side and 2+ on the left side.       Dorsalis pedis pulses are 2+ on the right side and 2+ on the left side.  Pulmonary:     Effort: Pulmonary effort is normal.     Comments: Lungs clear to auscultation bilaterally.  Symmetric chest rise.  No wheezing, rales, rhonchi. Musculoskeletal:     Comments: Tenderness palpation noted to the proximal left forearm with overlying soft tissue swelling.  Limited range of motion secondary to pain.  She has bony tenderness of the lateral aspect of left elbow as well with limited range of motion.  No bony tenderness noted to right upper extremity.  No pelvic instability.  No tenderness palpation of bilateral lower extremities.  Skin:     General: Skin is warm and dry.     Comments: Good distal cap refill. LUE is not dusky in appearance  or cool to touch.  Neurological:     Mental Status: She is alert.     Comments: Sensation intact along major nerve distributions of BUE  Psychiatric:        Speech: Speech normal.        Behavior: Behavior normal.     ED Results / Procedures / Treatments   Labs (all labs ordered are listed, but only abnormal results are displayed) Labs Reviewed  SARS CORONAVIRUS 2 BY RT PCR (HOSPITAL ORDER, [facility])    EKG None  Radiology DG Forearm Left  Result Date: 05/10/2020 CLINICAL DATA:  Fall.  Pain. EXAM: LEFT FOREARM - 2 VIEW COMPARISON:  No prior. FINDINGS: Minimally displaced fracture of the proximal radial neck cannot be excluded. A slight displaced fracture of the tip of the coronoid process of the ulna noted. Diffuse osteopenia and degenerative change. Punctate dystrophic calcifications noted in the soft tissues. IMPRESSION: Minimally displaced fracture of the proximal radial neck cannot be excluded. A slightly displaced fracture of the tip of the coronoid process of the ulna noted. Electronically Signed   By: Marcello Moores  Register   On: 05/10/2020 12:57   CT Head Wo Contrast  Result Date: 05/10/2020 CLINICAL DATA:  Facial trauma. EXAM: CT HEAD WITHOUT CONTRAST CT CERVICAL SPINE WITHOUT CONTRAST TECHNIQUE: Multidetector CT imaging of the head and cervical spine was performed following the standard protocol without intravenous contrast. Multiplanar CT image reconstructions of the cervical spine were also generated. COMPARISON:  Head CT 11/15/2004. FINDINGS: CT HEAD FINDINGS Brain: Moderate generalized parenchymal atrophy. Mild ill-defined hypoattenuation within the cerebral white matter is nonspecific, but consistent with chronic small vessel ischemic disease. There is no acute intracranial hemorrhage. No demarcated cortical infarct. No extra-axial fluid collection.  No evidence of intracranial mass. No midline shift. Vascular: No hyperdense vessel.  Atherosclerotic calcifications Skull: Normal. Negative for fracture or focal lesion. Sinuses/Orbits: Visualized orbits show no acute finding. Right maxillary sinus mucous retention cyst. Mild ethmoid and left maxillary sinus mucosal thickening. No significant mastoid effusion. Other: Left frontotemporal scalp hematoma. CT CERVICAL SPINE FINDINGS Alignment: Straightening of the expected cervical lordosis. No significant spondylolisthesis. Skull base and vertebrae: The basion-dental and atlanto-dental intervals are maintained.No evidence of acute fracture to the cervical spine. Soft tissues and spinal canal: No prevertebral fluid or swelling. No visible canal hematoma. Disc levels: Cervical spondylosis with multilevel disc space narrowing, posterior disc osteophytes, uncovertebral and facet hypertrophy. Upper chest: No consolidation within the imaged lung apices. No visible pneumothorax Other: Chronic fracture deformity of the right clavicle, partially imaged. IMPRESSION: CT head: 1. No evidence of acute intracranial abnormality. 2. Left frontotemporal scalp hematoma. 3. Moderate generalized parenchymal atrophy with mild chronic small vessel ischemic disease. 4. Paranasal sinus disease as described. CT cervical spine: 1. No evidence of acute fracture to the cervical spine. 2. Cervical spondylosis as described. 3. Chronic fracture deformity of the right clavicle. Electronically Signed   By: Kellie Simmering DO   On: 05/10/2020 15:43   CT Cervical Spine Wo Contrast  Result Date: 05/10/2020 CLINICAL DATA:  Facial trauma. EXAM: CT HEAD WITHOUT CONTRAST CT CERVICAL SPINE WITHOUT CONTRAST TECHNIQUE: Multidetector CT imaging of the head and cervical spine was performed following the standard protocol without intravenous contrast. Multiplanar CT image reconstructions of the cervical spine were also generated. COMPARISON:  Head CT 11/15/2004.  FINDINGS: CT HEAD FINDINGS Brain: Moderate generalized parenchymal atrophy. Mild ill-defined hypoattenuation within the cerebral white matter is nonspecific, but consistent with chronic small  vessel ischemic disease. There is no acute intracranial hemorrhage. No demarcated cortical infarct. No extra-axial fluid collection. No evidence of intracranial mass. No midline shift. Vascular: No hyperdense vessel.  Atherosclerotic calcifications Skull: Normal. Negative for fracture or focal lesion. Sinuses/Orbits: Visualized orbits show no acute finding. Right maxillary sinus mucous retention cyst. Mild ethmoid and left maxillary sinus mucosal thickening. No significant mastoid effusion. Other: Left frontotemporal scalp hematoma. CT CERVICAL SPINE FINDINGS Alignment: Straightening of the expected cervical lordosis. No significant spondylolisthesis. Skull base and vertebrae: The basion-dental and atlanto-dental intervals are maintained.No evidence of acute fracture to the cervical spine. Soft tissues and spinal canal: No prevertebral fluid or swelling. No visible canal hematoma. Disc levels: Cervical spondylosis with multilevel disc space narrowing, posterior disc osteophytes, uncovertebral and facet hypertrophy. Upper chest: No consolidation within the imaged lung apices. No visible pneumothorax Other: Chronic fracture deformity of the right clavicle, partially imaged. IMPRESSION: CT head: 1. No evidence of acute intracranial abnormality. 2. Left frontotemporal scalp hematoma. 3. Moderate generalized parenchymal atrophy with mild chronic small vessel ischemic disease. 4. Paranasal sinus disease as described. CT cervical spine: 1. No evidence of acute fracture to the cervical spine. 2. Cervical spondylosis as described. 3. Chronic fracture deformity of the right clavicle. Electronically Signed   By: Kellie Simmering DO   On: 05/10/2020 15:43   CT SHOULDER LEFT WO CONTRAST  Result Date: 05/10/2020 CLINICAL DATA:  Left shoulder  fracture EXAM: CT OF THE UPPER LEFT EXTREMITY WITHOUT CONTRAST TECHNIQUE: Multidetector CT imaging of the upper left extremity was performed according to the standard protocol. COMPARISON:  X-ray 05/10/2020 FINDINGS: Bones/Joint/Cartilage Comminuted fracture of the proximal left humerus with transversely oriented fracture through the surgical neck with minimal medial displacement. Greater tuberosity fracture fragment is superolaterally displaced by up to 7 mm. No definite fracture involvement of the lesser tuberosity. No evidence of fracture extension to the articular surface. Geographic area of sclerosis at the superior aspect of the humeral head suspicious for avascular necrosis. There are multiple mineralized density superior to the greater tuberosity which may reflect tiny comminuted fragments or calcific rotator cuff tendinopathy. Humeral head is slightly inferiorly subluxed relative to the glenoid likely reflecting an underlying glenohumeral joint hemarthrosis. AC joint is intact with minimal arthropathy. Scapula intact without fracture. Remaining visualized osseous structures appear intact. Ligaments Suboptimally assessed by CT. Muscles and Tendons Preserved muscle bulk of the rotator cuff musculature. Rotator cuff tendons are poorly evaluated by CT but appear to at least partially remain intact with insertion onto the mildly displaced greater tuberosity fracture fragment. Soft tissues Local soft tissue swelling at the fracture site. No soft tissue fluid collection or hematoma. No left axillary lymphadenopathy. Visualized portion of the left lung field is clear. Aortic atherosclerosis. Mildly dilated main pulmonary trunk at 3.5 cm. IMPRESSION: 1. Comminuted fracture of the proximal left humerus involving the surgical neck and greater tuberosity. 2. Humeral head is slightly inferiorly subluxed relative to the glenoid likely reflecting an underlying glenohumeral joint hemarthrosis. 3. Geographic area of  sclerosis at the superior aspect of the humeral head suspicious for avascular necrosis. 4. Mildly dilated main pulmonary trunk at 3.5 cm, which can be seen with pulmonary arterial hypertension. 5. Aortic atherosclerosis. (ICD10-I70.0). Electronically Signed   By: Davina Poke D.O.   On: 05/10/2020 15:33   DG Humerus Left  Result Date: 05/10/2020 CLINICAL DATA:  Fall.  Pain. EXAM: LEFT HUMERUS - 2+ VIEW COMPARISON:  Chest x-ray 07/18/2016. FINDINGS: Comminuted fracture of the proximal left humerus noted.  Displaced fracture of the greater tuberosity noted. No evidence of dislocation. Diffuse osteopenia degenerative change. Calcific densities noted over supraspinatus tendon consistent calcific supraspinatus tendinosis. IMPRESSION: 1. Comminuted fracture of the proximal left humerus. Displaced fracture of the greater tuberosity. 2. Diffuse osteopenia degenerative change. Calcific supraspinatus tendinosis. Electronically Signed   By: Marcello Moores  Register   On: 05/10/2020 13:00    Procedures Procedures (including critical care time)  Medications Ordered in ED Medications  ondansetron (ZOFRAN) injection 4 mg (4 mg Intravenous Given 05/10/20 1419)  morphine 4 MG/ML injection 4 mg (4 mg Intravenous Given 05/10/20 1419)    ED Course  I have reviewed the triage vital signs and the nursing notes.  Pertinent labs & imaging results that were available during my care of the patient were reviewed by me and considered in my medical decision making (see chart for details).    MDM Rules/Calculators/A&P                          84 year old female who presents for evaluation after mechanical fall that occurred earlier this morning.  She reports hitting her head.  No LOC.  She is not on blood thinners.  Pain to left upper extremity since noon today.  Initial arrival she is afebrile, nontoxic-appearing.  Vital signs are stable.  She is slightly hypertensive.  She is neurovascularly intact.  Patient with palpation of  left upper extremity, particularly the proximal humerus left elbow.  Concern for fracture versus dislocation.  Plan for x-rays.  Additionally, given that she hit her head, will plan for imaging of head and neck.  X-ray forearm shows a minimally displaced fracture of the proximal radial neck that cannot be excluded.  A slightly displaced fracture of the tip of the coronoid process of the ulna is noted.  X-ray of humerus shows comminuted fracture of the proximal left humerus.  She also has a displaced fracture of the greater tuberosity.  Discussed with Hilbert Odor (Ortho PA).  He will come evaluate patient in the ED.  CT head shows no acute intracranial normality.  She has a left frontotemporal scalp hematoma.  CT cervical spine shows no acute fracture.  Splint ordered per Ortho PA.  Reevaluation after splint shows patient with good distal cap refill, sensation.  Patient instructed to follow-up with Ortho as directed. At this time, patient exhibits no emergent life-threatening condition that require further evaluation in ED. Patient had ample opportunity for questions and discussion. All patient's questions were answered with full understanding. Strict return precautions discussed. Patient expresses understanding and agreement to plan.   Portions of this note were generated with [employer]. Dictation errors may occur despite best attempts at proofreading.  Final Clinical Impression(s) / ED Diagnoses Final diagnoses:  Closed fracture of proximal end of left humerus, unspecified fracture morphology, initial encounter  Fall, initial encounter  Hematoma of scalp, initial encounter    Rx / DC Orders ED Discharge Orders         Ordered    ondansetron (ZOFRAN ODT) 4 MG disintegrating tablet  Every 8 hours PRN        05/10/20 1558    HYDROcodone-acetaminophen (NORCO/VICODIN) 5-325 MG tablet  Every 6 hours PRN        05/10/20 1558           Volanda Napoleon, PA-C 05/10/20  [place], [place], DO 05/11/20 0715

## 2020-05-10 NOTE — Progress Notes (Signed)
Orthopedic Tech Progress Note Patient Details:  MONTI VILLERS 01/22/1936 [id number]  Ortho Devices Type of Ortho Device: Post (long arm) splint Ortho Device/Splint Location: ULE Ortho Device/Splint Interventions: Application, Ordered   Post Interventions Patient Tolerated: Well Instructions Provided: Care of device   Lee-Anne Flicker A Lonza Shimabukuro 05/10/2020, 4:08 PM

## 2020-05-10 NOTE — ED Notes (Signed)
Pt spoke to daughter and provided update

## 2020-05-10 NOTE — Discharge Instructions (Addendum)
You can take 1000 mg of Tylenol.  Do not exceed 4000 mg of Tylenol a day.  Take pain medications as directed for break through pain. Do not drive or operate machinery while taking this medication.   Take zofran for nausea.   Wear the sling for support and stabilization. Do not get the splint wet.   Follow up with the referred orthopedic doctor. Call his office and arrange for an outpatient appointment.   Return to the Emergency Dept for any worsening severe pain, numbness of the hands, discoloration of the hands or any other worsening or concerning symptoms.

## 2020-05-23 ENCOUNTER — Ambulatory Visit: Payer: Self-pay

## 2020-05-23 NOTE — Telephone Encounter (Signed)
Pt stated that she fell and hit her head on 05/10/20 and was evaluated in ED and had CT. Pt stated that since 05/10/20 she has been having severe dizziness and vertigo. Pt stated that laying in bed flat, bending over and turning head back and forth induces dizziness.  Pt stated that she is having to hold onto objects to maintain balance. Stated she has to stand for 10 minutes before attempting to walk. Her children provided her with a bedside commode. Pt stated that she has a "egg sized lump" to her left forehead. Pt c/o headache on scale 5/10 to the left forehead. Pt denies numbness, weakness, tingling to either side of face, arms or legs. Pt denies double or blurred vision. Speech is clear.  Pt was wanting a repeat CT/MRI scan ASAP. Pt advised to go to ED and get reevaluated  ASAP. Informed pt that her condition needs evaluation ASAP and pt refused. Pt requesting an appt.  Appt made Wednesday with Maximiano Coss NP.  Pt informed to call 911 for worsening sx or for new neurological sx. Pt is a former [employer] scientist. Pt verbalized understanding.          Reason for Disposition . SEVERE dizziness (e.g., unable to stand, requires support to walk, feels like passing out now)  Answer Assessment - Initial Assessment Questions 1. DESCRIPTION: "Describe your dizziness."     Standing=when walking room starts spinning. When sitting to standing very dizzy has to 30 minutes before ambulating. 2. LIGHTHEADED: "Do you feel lightheaded?" (e.g., somewhat faint, woozy, weak upon standing)     yes 3. VERTIGO: "Do you feel like either you or the room is spinning or tilting?" (i.e. vertigo)     yes 4. SEVERITY: "How bad is it?"  "Do you feel like you are going to faint?" "Can you stand and walk?"   - MILD: Feels slightly dizzy, but walking normally.   - MODERATE: Feels very unsteady when walking, but not falling; interferes with normal activities (e.g., school, work) .   - SEVERE: Unable to walk without falling, or  requires assistance to walk without falling; feels like passing out now.      severe 5. ONSET:  "When did the dizziness begin?"     05/10/20 6. AGGRAVATING FACTORS: "Does anything make it worse?" (e.g., standing, change in head position)     Laying supine, sitting to standing, changing head position 7. HEART RATE: "Can you tell me your heart rate?" "How many beats in 15 seconds?"  (Note: not all patients can do this)       Unable to do  8. CAUSE: "What do you think is causing the dizziness?"     fall 9. RECURRENT SYMPTOM: "Have you had dizziness before?" If Yes, ask: "When was the last time?" "What happened that time?"     no 10. OTHER SYMPTOMS: "Do you have any other symptoms?" (e.g., fever, chest pain, vomiting, diarrhea, bleeding)      Headache 5/10 left front of head of left side 11. PREGNANCY: "Is there any chance you are pregnant?" "When was your last menstrual period?"       n/a  Protocols used: DIZZINESS [facility]

## 2020-05-24 ENCOUNTER — Telehealth: Payer: Self-pay | Admitting: Emergency Medicine

## 2020-05-24 ENCOUNTER — Telehealth: Payer: Medicare HMO | Admitting: Emergency Medicine

## 2020-05-24 NOTE — Telephone Encounter (Signed)
Called pt and had to leave a VM let her know due to her symptoms we will not order MRI/CT she needs to be seen in ER instead

## 2020-05-24 NOTE — Telephone Encounter (Signed)
Pt fell in august was evaluated and cleared by ED, pt states there is a lump and struggling with vertigo/dizzy spells. Would like you to order new CT/MRI. Should she make an OV with [name] or go directly to ED again?

## 2020-05-24 NOTE — Telephone Encounter (Signed)
Called pt per CRM while we where closed for lunch / to sort out what really needs to be done for patient and schedule virtual visit with her provider for [place] if possible  / LVM for patient to call our office at her earliest convenience.

## 2020-05-24 NOTE — Telephone Encounter (Signed)
Go directly to the ED again.  Thanks.

## 2020-05-25 ENCOUNTER — Encounter: Payer: Self-pay | Admitting: Registered Nurse

## 2020-05-25 ENCOUNTER — Ambulatory Visit (INDEPENDENT_AMBULATORY_CARE_PROVIDER_SITE_OTHER): Payer: Medicare HMO | Admitting: Registered Nurse

## 2020-05-25 ENCOUNTER — Other Ambulatory Visit: Payer: Self-pay

## 2020-05-25 VITALS — BP 169/77 | HR 85 | Temp 97.6°F | Resp 18 | Ht 65.0 in

## 2020-05-25 DIAGNOSIS — W19XXXS Unspecified fall, sequela: Secondary | ICD-10-CM

## 2020-05-25 DIAGNOSIS — D649 Anemia, unspecified: Secondary | ICD-10-CM

## 2020-05-25 DIAGNOSIS — E538 Deficiency of other specified B group vitamins: Secondary | ICD-10-CM | POA: Diagnosis not present

## 2020-05-25 DIAGNOSIS — E559 Vitamin D deficiency, unspecified: Secondary | ICD-10-CM | POA: Diagnosis not present

## 2020-05-25 DIAGNOSIS — S42402S Unspecified fracture of lower end of left humerus, sequela: Secondary | ICD-10-CM

## 2020-05-25 MED ORDER — CYANOCOBALAMIN 1000 MCG/ML IJ SOLN
1000.0000 ug | Freq: Once | INTRAMUSCULAR | Status: AC
  Administered 2020-05-25: 1000 ug via INTRAMUSCULAR

## 2020-05-25 NOTE — Patient Instructions (Signed)
° ° ° °  If you have lab work done today you will be contacted with your lab results within the next 2 weeks.  If you have not heard from us then please contact us. The fastest way to get your results is to register for My Chart. ° ° °IF you received an x-ray today, you will receive an invoice from [facility]. Please contact [facility] at [phone] with questions or concerns regarding your invoice.  ° °IF you received labwork today, you will receive an invoice from [facility]. Please contact [facility] at [phone] with questions or concerns regarding your invoice.  ° °Our billing staff will not be able to assist you with questions regarding bills from these companies. ° °You will be contacted with the lab results as soon as they are available. The fastest way to get your results is to activate your My Chart account. Instructions are located on the last page of this paperwork. If you have not heard from us regarding the results in 2 weeks, please contact this office. °  ° ° ° °

## 2020-05-26 ENCOUNTER — Encounter: Payer: Self-pay | Admitting: Registered Nurse

## 2020-05-26 LAB — COMPREHENSIVE METABOLIC PANEL
ALT: 10 IU/L (ref 0–32)
AST: 18 IU/L (ref 0–40)
Albumin/Globulin Ratio: 1.9 (ref 1.2–2.2)
Albumin: 4.2 g/dL (ref 3.6–4.6)
Alkaline Phosphatase: 123 IU/L — ABNORMAL HIGH (ref 44–121)
BUN/Creatinine Ratio: 19 (ref 12–28)
BUN: 15 mg/dL (ref 8–27)
Bilirubin Total: 0.3 mg/dL (ref 0.0–1.2)
CO2: 29 mmol/L (ref 20–29)
Calcium: 10.6 mg/dL — ABNORMAL HIGH (ref 8.7–10.3)
Chloride: 100 mmol/L (ref 96–106)
Creatinine, Ser: 0.78 mg/dL (ref 0.57–1.00)
GFR calc Af Amer: 82 mL/min/{1.73_m2} (ref >59)
GFR calc non Af Amer: 72 mL/min/{1.73_m2} (ref >59)
Globulin, Total: 2.2 g/dL (ref 1.5–4.5)
Glucose: 92 mg/dL (ref 65–99)
Potassium: 4.6 mmol/L (ref 3.5–5.2)
Sodium: 138 mmol/L (ref 134–144)
Total Protein: 6.4 g/dL (ref 6.0–8.5)

## 2020-05-26 LAB — CBC WITH DIFFERENTIAL
Basophils Absolute: 0.1 10*3/uL (ref 0.0–0.2)
Basos: 1 %
EOS (ABSOLUTE): 0.3 10*3/uL (ref 0.0–0.4)
Eos: 4 %
Hematocrit: 34.2 % (ref 34.0–46.6)
Hemoglobin: 11.1 g/dL (ref 11.1–15.9)
Immature Grans (Abs): 0.1 10*3/uL (ref 0.0–0.1)
Immature Granulocytes: 1 %
Lymphocytes Absolute: 1.1 10*3/uL (ref 0.7–3.1)
Lymphs: 17 %
MCH: 27.6 pg (ref 26.6–33.0)
MCHC: 32.5 g/dL (ref 31.5–35.7)
MCV: 85 fL (ref 79–97)
Monocytes Absolute: 0.6 10*3/uL (ref 0.1–0.9)
Monocytes: 9 %
Neutrophils Absolute: 4.3 10*3/uL (ref 1.4–7.0)
Neutrophils: 68 %
RBC: 4.02 x10E6/uL (ref 3.77–5.28)
RDW: 14.5 % (ref 11.7–15.4)
WBC: 6.4 10*3/uL (ref 3.4–10.8)

## 2020-05-26 LAB — VITAMIN D 25 HYDROXY (VIT D DEFICIENCY, FRACTURES): Vit D, 25-Hydroxy: 37.8 ng/mL (ref 30.0–100.0)

## 2020-05-26 LAB — LIPID PANEL
Chol/HDL Ratio: 3.1 ratio (ref 0.0–4.4)
Cholesterol, Total: 167 mg/dL (ref 100–199)
HDL: 54 mg/dL (ref >39)
LDL Chol Calc (NIH): 92 mg/dL (ref 0–99)
Triglycerides: 116 mg/dL (ref 0–149)
VLDL Cholesterol Cal: 21 mg/dL (ref 5–40)

## 2020-05-26 LAB — HEMOGLOBIN A1C
Est. average glucose Bld gHb Est-mCnc: 131 mg/dL
Hgb A1c MFr Bld: 6.2 % — ABNORMAL HIGH (ref 4.8–5.6)

## 2020-05-26 LAB — VITAMIN B12: Vitamin B-12: >2000 pg/mL — ABNORMAL HIGH (ref 232–1245)

## 2020-05-26 NOTE — Progress Notes (Signed)
Good morning,  Normal results letter, please  Thank you  Kathrin Ruddy, NP

## 2020-05-26 NOTE — Progress Notes (Signed)
Established Patient Office Visit  Subjective:  Patient ID: Ellen Johnson, female    DOB: 27-Mar-1936  Age: 84 y.o. MRN: [id number]  CC:  Chief Complaint  Patient presents with   Transitions Of Care    patient states she is here for a transition of care, patient states she has had an fall 8/31 and patient was about to fall and she tried to help and she fell instead. Broke her shoulder , and by elbow    HPI Ellen Johnson presents for visit to est care and follow up from fall.   Was helping Alzheimer patient shower - helps with ADLs, has been for 15 years. The patient was about to fall, she kept him up but she fell herself. Landed on L side. Fractures in distal and proximal ends of humerus. Was seen in ED, placed in cast, suggested that she immobilize it, follow up in primary care and with her ortho provider, Dr. Angelena Form with [facility].   Feeling better today. No symptoms of clot or infection/inflammation. Immobilized well. Has been taking it easy  Needs b12 shot - gets these regularly despite b12 elevation. Takes a variety of supplements and OTCs, does not take rx meds.  Feeling well overall. Former patient of Dr. Elder Cyphers, Dr. Laney Pastor, and others at our office.  Past Medical History:  Diagnosis Date   Anemia    Diverticulosis    GERD (gastroesophageal reflux disease)    HTN (hypertension)    Hx of colonic polyps     Past Surgical History:  Procedure Laterality Date   ABDOMINAL HYSTERECTOMY     TAH BSO   APPENDECTOMY     CHOLECYSTECTOMY     FRACTURE SURGERY      Family History  Problem Relation Age of Onset   Stroke Mother    Colon cancer Father    Stroke Father    Stroke Maternal Grandmother    Breast cancer Paternal Grandmother     Social History   Socioeconomic History   Marital status: Widowed    Spouse name: Not on file   Number of children: 7   Years of education: Not on file   Highest education level: Associate degree: academic  program  Occupational History   Occupation: RN  Tobacco Use   Smoking status: Never Smoker   Smokeless tobacco: Never Used  Scientific laboratory technician Use: Never used  Substance and Sexual Activity   Alcohol use: Yes    Comment: 4 oz weekly of wine   Drug use: No   Sexual activity: Never  Other Topics Concern   Not on file  Social History Narrative   Not on file   Social Determinants of Health   Financial Resource Strain:    Difficulty of Paying Living Expenses: Not on file  Food Insecurity:    Worried About Charity fundraiser in the Last Year: Not on file   [employer] of Food in the Last Year: Not on file  Transportation Needs:    Lack of Transportation (Medical): Not on file   Lack of Transportation (Non-Medical): Not on file  Physical Activity:    Days of Exercise per Week: Not on file   Minutes of Exercise per Session: Not on file  Stress:    Feeling of Stress : Not on file  Social Connections:    Frequency of Communication with Friends and Family: Not on file   Frequency of Social Gatherings with Friends and Family: Not on  file   Attends Religious Services: Not on file   Active Member of Clubs or Organizations: Not on file   Attends Archivist Meetings: Not on file   Marital Status: Not on file  Intimate Partner Violence:    Fear of Current or Ex-Partner: Not on file   Emotionally Abused: Not on file   Physically Abused: Not on file   Sexually Abused: Not on file    Outpatient Medications Prior to Visit  Medication Sig Dispense Refill   betamethasone dipropionate 0.05 % cream Apply topically 2 (two) times daily. 45 g 0   camphor-menthol (SARNA) lotion Apply 1 application topically as needed for itching. APPLY EVERY MORNING 222 mL 0   Ferrous Sulfate (IRON PO) Take by mouth daily.     fish oil-omega-3 fatty acids 1000 MG capsule Take 1 g by mouth daily.      GINSENG PO Take by mouth daily.     Probiotic Product (PROBIOTIC  DAILY PO) Take by mouth.     vitamin C (ASCORBIC ACID) 500 MG tablet Take 500 mg by mouth daily. Takes only during winter months     HYDROcodone-acetaminophen (NORCO/VICODIN) 5-325 MG tablet Take 1-2 tablets by mouth every 6 (six) hours as needed. (Patient not taking: Reported on 05/25/2020) 10 tablet 0   Multiple Vitamin (MULTIVITAMIN) tablet Take 1 tablet by mouth daily. (Patient not taking: Reported on 05/25/2020)     omeprazole (PRILOSEC) 40 MG capsule Take 40 mg by mouth daily. (Patient not taking: Reported on 05/25/2020)     ondansetron (ZOFRAN ODT) 4 MG disintegrating tablet Take 1 tablet (4 mg total) by mouth every 8 (eight) hours as needed for nausea or vomiting. (Patient not taking: Reported on 05/25/2020) 6 tablet 0   torsemide (DEMADEX) 10 MG tablet Take 1 tablet (10 mg total) by mouth daily. (Patient not taking: Reported on 05/25/2020) 30 tablet 1   Facility-Administered Medications Prior to Visit  Medication Dose Route Frequency Provider Last Rate Last Admin   cyanocobalamin ((VITAMIN B-12)) injection 1,000 mcg  1,000 mcg Intramuscular Q30 days Horald Pollen, MD   1,000 mcg at 03/16/19 1350    Allergies  Allergen Reactions   Valium [Diazepam] Other (See Comments)    Totally different personality    ROS Review of Systems  Constitutional: Negative.   HENT: Negative.   Eyes: Negative.   Respiratory: Negative.   Cardiovascular: Negative.   Gastrointestinal: Negative.   Endocrine: Negative.   Genitourinary: Negative.   Musculoskeletal: Positive for arthralgias. Negative for back pain, gait problem, joint swelling, myalgias, neck pain and neck stiffness.  Skin: Negative.   Allergic/Immunologic: Negative.   Neurological: Negative.   Hematological: Negative.   Psychiatric/Behavioral: Negative.       Objective:    Physical Exam Vitals and nursing note reviewed.  Constitutional:      General: She is not in acute distress.    Appearance: Normal appearance.  She is not ill-appearing, toxic-appearing or diaphoretic.  Cardiovascular:     Rate and Rhythm: Normal rate and regular rhythm.  Pulmonary:     Effort: Pulmonary effort is normal. No respiratory distress.  Musculoskeletal:     Comments: L arm in cast.  Skin:    General: Skin is warm and dry.     Capillary Refill: Capillary refill takes less than 2 seconds.  Neurological:     General: No focal deficit present.     Mental Status: She is alert and oriented to person, place, and time.  Psychiatric:        Mood and Affect: Mood normal.        Behavior: Behavior normal.        Thought Content: Thought content normal.        Judgment: Judgment normal.     BP (!) 169/77    Pulse 85    Temp 97.6 F (36.4 C) (Temporal)    Resp 18    Ht 5\' 5"  (1.651 m)    SpO2 96%    BMI 26.63 kg/m  Wt Readings from Last 3 Encounters:  05/10/20 160 lb (72.6 kg)  06/27/19 257 lb (116.6 kg)  03/16/19 193 lb 12.8 oz (87.9 kg)     There are no preventive care reminders to display for this patient.  There are no preventive care reminders to display for this patient.  Lab Results  Component Value Date   TSH 2.620 04/30/2018   Lab Results  Component Value Date   WBC 6.4 05/25/2020   HGB 11.1 05/25/2020   HCT 34.2 05/25/2020   MCV 85 05/25/2020   PLT 286 03/16/2019   Lab Results  Component Value Date   NA 138 05/25/2020   K 4.6 05/25/2020   CO2 29 05/25/2020   GLUCOSE 92 05/25/2020   BUN 15 05/25/2020   CREATININE 0.78 05/25/2020   BILITOT 0.3 05/25/2020   ALKPHOS 123 (H) 05/25/2020   AST 18 05/25/2020   ALT 10 05/25/2020   PROT 6.4 05/25/2020   ALBUMIN 4.2 05/25/2020   CALCIUM 10.6 (H) 05/25/2020   ANIONGAP 8 07/18/2016   Lab Results  Component Value Date   CHOL 167 05/25/2020   Lab Results  Component Value Date   HDL 54 05/25/2020   Lab Results  Component Value Date   LDLCALC 92 05/25/2020   Lab Results  Component Value Date   TRIG 116 05/25/2020   Lab Results   Component Value Date   CHOLHDL 3.1 05/25/2020   Lab Results  Component Value Date   HGBA1C 6.2 (H) 05/25/2020      Assessment & Plan:   Problem List Items Addressed This Visit      Other   Anemia   Relevant Orders   CBC With Differential (Completed)   Comprehensive metabolic panel (Completed)   Lipid panel (Completed)   Hemoglobin A1c (Completed)    Other Visit Diagnoses    B12 deficiency    -  Primary   Relevant Medications   cyanocobalamin ((VITAMIN B-12)) injection 1,000 mcg (Completed)   Other Relevant Orders   Vitamin B12 (Completed)   Vitamin D deficiency       Relevant Orders   Vitamin D, 25-hydroxy (Completed)   Fall, sequela       Relevant Orders   CBC With Differential (Completed)   Comprehensive metabolic panel (Completed)   Lipid panel (Completed)   Hemoglobin A1c (Completed)      Meds ordered this encounter  Medications   cyanocobalamin ((VITAMIN B-12)) injection 1,000 mcg    Follow-up: No follow-ups on file.   PLAN  Labs collected, will follow up as warranted  Will order follow up CTs. Reminded patient to sign release to have these sent to Dr. Angelena Form. Will reach out to his office via referral to make sure she's scheduled over there.  Return PRN  Patient encouraged to call clinic with any questions, comments, or concerns.  Maximiano Coss, NP

## 2020-06-06 DIAGNOSIS — M25532 Pain in left wrist: Secondary | ICD-10-CM | POA: Diagnosis not present

## 2020-06-06 DIAGNOSIS — M19032 Primary osteoarthritis, left wrist: Secondary | ICD-10-CM | POA: Diagnosis not present

## 2020-06-06 DIAGNOSIS — M24012 Loose body in left shoulder: Secondary | ICD-10-CM | POA: Diagnosis not present

## 2020-06-06 DIAGNOSIS — S42292A Other displaced fracture of upper end of left humerus, initial encounter for closed fracture: Secondary | ICD-10-CM | POA: Diagnosis not present

## 2020-06-06 DIAGNOSIS — M19012 Primary osteoarthritis, left shoulder: Secondary | ICD-10-CM | POA: Diagnosis not present

## 2020-06-06 DIAGNOSIS — M11232 Other chondrocalcinosis, left wrist: Secondary | ICD-10-CM | POA: Diagnosis not present

## 2020-06-06 DIAGNOSIS — M8588 Other specified disorders of bone density and structure, other site: Secondary | ICD-10-CM | POA: Diagnosis not present

## 2020-06-07 ENCOUNTER — Telehealth: Payer: Self-pay | Admitting: Emergency Medicine

## 2020-06-07 NOTE — Telephone Encounter (Signed)
Crystal from [place] called needing clarification on which CT should take priority for this patient / spoke with [facility] referral coordinator , who suggested I talked to provider. checked with Orland Mustard  NP . Was told ct scan to be done first would be a repeat on Shoulder, came back to phone was told that patient called and cancelled all appt with their office at [facility] .

## 2020-06-07 NOTE — Telephone Encounter (Signed)
LVM for pt to cb regarding her canalling appt at [facility]

## 2020-06-08 ENCOUNTER — Other Ambulatory Visit: Payer: Medicare HMO

## 2020-07-06 ENCOUNTER — Other Ambulatory Visit: Payer: Self-pay

## 2020-07-06 ENCOUNTER — Encounter: Payer: Self-pay | Admitting: Registered Nurse

## 2020-07-06 ENCOUNTER — Ambulatory Visit (INDEPENDENT_AMBULATORY_CARE_PROVIDER_SITE_OTHER): Payer: Medicare HMO | Admitting: Registered Nurse

## 2020-07-06 VITALS — BP 128/72 | HR 96 | Temp 94.0°F | Ht 65.0 in | Wt 202.0 lb

## 2020-07-06 DIAGNOSIS — Z23 Encounter for immunization: Secondary | ICD-10-CM

## 2020-07-06 DIAGNOSIS — R609 Edema, unspecified: Secondary | ICD-10-CM | POA: Diagnosis not present

## 2020-07-06 NOTE — Patient Instructions (Signed)
° ° ° °  If you have lab work done today you will be contacted with your lab results within the next 2 weeks.  If you have not heard from us then please contact us. The fastest way to get your results is to register for My Chart. ° ° °IF you received an x-ray today, you will receive an invoice from [facility]. Please contact [facility] at [phone] with questions or concerns regarding your invoice.  ° °IF you received labwork today, you will receive an invoice from [facility]. Please contact [facility] at [phone] with questions or concerns regarding your invoice.  ° °Our billing staff will not be able to assist you with questions regarding bills from these companies. ° °You will be contacted with the lab results as soon as they are available. The fastest way to get your results is to activate your My Chart account. Instructions are located on the last page of this paperwork. If you have not heard from us regarding the results in 2 weeks, please contact this office. °  ° ° ° °

## 2020-07-11 DIAGNOSIS — M19012 Primary osteoarthritis, left shoulder: Secondary | ICD-10-CM | POA: Diagnosis not present

## 2020-07-11 DIAGNOSIS — S42212D Unspecified displaced fracture of surgical neck of left humerus, subsequent encounter for fracture with routine healing: Secondary | ICD-10-CM | POA: Diagnosis not present

## 2020-07-11 DIAGNOSIS — Z888 Allergy status to other drugs, medicaments and biological substances status: Secondary | ICD-10-CM | POA: Diagnosis not present

## 2020-07-11 DIAGNOSIS — S42252D Displaced fracture of greater tuberosity of left humerus, subsequent encounter for fracture with routine healing: Secondary | ICD-10-CM | POA: Diagnosis not present

## 2020-08-16 DIAGNOSIS — Z5181 Encounter for therapeutic drug level monitoring: Secondary | ICD-10-CM | POA: Diagnosis not present

## 2020-08-16 DIAGNOSIS — C44519 Basal cell carcinoma of skin of other part of trunk: Secondary | ICD-10-CM | POA: Diagnosis not present

## 2020-08-16 DIAGNOSIS — I890 Lymphedema, not elsewhere classified: Secondary | ICD-10-CM | POA: Diagnosis not present

## 2020-08-16 DIAGNOSIS — Z79899 Other long term (current) drug therapy: Secondary | ICD-10-CM | POA: Diagnosis not present

## 2020-08-16 DIAGNOSIS — L409 Psoriasis, unspecified: Secondary | ICD-10-CM | POA: Diagnosis not present

## 2020-10-02 ENCOUNTER — Encounter: Payer: Self-pay | Admitting: Registered Nurse

## 2020-10-02 NOTE — Progress Notes (Signed)
Acute Office Visit  Subjective:    Patient ID: Ellen Johnson, female    DOB: 1936/08/03, 85 y.o.   MRN: [id number]  Chief Complaint  Patient presents with  . pt suspects phlebitis    of both legs , worsened since last week , taking furosemide taking 2 / 20 mg     HPI Patient is in today for leg swelling Swelling, redness, worsened since last week Taking furosemide 20mg  PO bid with good effect No warmth or pain About even bilaterally No doe, chest pain, claudication, or other CV symptoms. Concern for phlebitis/cellulitis  Past Medical History:  Diagnosis Date  . Anemia   . Diverticulosis   . GERD (gastroesophageal reflux disease)   . HTN (hypertension)   . Hx of colonic polyps     Past Surgical History:  Procedure Laterality Date  . ABDOMINAL HYSTERECTOMY     TAH BSO  . APPENDECTOMY    . CHOLECYSTECTOMY    . FRACTURE SURGERY      Family History  Problem Relation Age of Onset  . Stroke Mother   . Colon cancer Father   . Stroke Father   . Stroke Maternal Grandmother   . Breast cancer Paternal Grandmother     Social History   Socioeconomic History  . Marital status: Widowed    Spouse name: Not on file  . Number of children: 7  . Years of education: Not on file  . Highest education level: Associate degree: academic program  Occupational History  . Occupation: Therapist, sports  Tobacco Use  . Smoking status: Never Smoker  . Smokeless tobacco: Never Used  Vaping Use  . Vaping Use: Never used  Substance and Sexual Activity  . Alcohol use: Yes    Comment: 4 oz weekly of wine  . Drug use: No  . Sexual activity: Never  Other Topics Concern  . Not on file  Social History Narrative  . Not on file   Social Determinants of Health   Financial Resource Strain: Not on file  Food Insecurity: Not on file  Transportation Needs: Not on file  Physical Activity: Not on file  Stress: Not on file  Social Connections: Not on file  Intimate Partner Violence: Not on file     Outpatient Medications Prior to Visit  Medication Sig Dispense Refill  . betamethasone dipropionate 0.05 % cream Apply topically 2 (two) times daily. 45 g 0  . camphor-menthol (SARNA) lotion Apply 1 application topically as needed for itching. APPLY EVERY MORNING 222 mL 0  . Ferrous Sulfate (IRON PO) Take by mouth daily.    . fish oil-omega-3 fatty acids 1000 MG capsule Take 1 g by mouth daily.     Marland Kitchen GINSENG PO Take by mouth daily.    Marland Kitchen HYDROcodone-acetaminophen (NORCO/VICODIN) 5-325 MG tablet Take 1-2 tablets by mouth every 6 (six) hours as needed. 10 tablet 0  . Multiple Vitamin (MULTIVITAMIN) tablet Take 1 tablet by mouth daily.     Marland Kitchen omeprazole (PRILOSEC) 40 MG capsule Take 40 mg by mouth daily.     . ondansetron (ZOFRAN ODT) 4 MG disintegrating tablet Take 1 tablet (4 mg total) by mouth every 8 (eight) hours as needed for nausea or vomiting. 6 tablet 0  . Probiotic Product (PROBIOTIC DAILY PO) Take by mouth.    . torsemide (DEMADEX) 10 MG tablet Take 1 tablet (10 mg total) by mouth daily. 30 tablet 1  . vitamin C (ASCORBIC ACID) 500 MG tablet Take 500 mg by  mouth daily. Takes only during winter months     Facility-Administered Medications Prior to Visit  Medication Dose Route Frequency Provider Last Rate Last Admin  . cyanocobalamin ((VITAMIN B-12)) injection 1,000 mcg  1,000 mcg Intramuscular Q30 days Georgina Quint, MD   1,000 mcg at 03/16/19 1350    Allergies  Allergen Reactions  . Valium [Diazepam] Other (See Comments)    Totally different personality    Review of Systems  Constitutional: Negative.   HENT: Negative.   Eyes: Negative.   Respiratory: Negative.   Cardiovascular: Positive for leg swelling. Negative for chest pain and palpitations.  Gastrointestinal: Negative.   Genitourinary: Negative.   Musculoskeletal: Negative.   Skin: Negative.   Neurological: Negative.   Psychiatric/Behavioral: Negative.   All other systems reviewed and are negative.       Objective:    Physical Exam Vitals and nursing note reviewed.  Constitutional:      General: She is not in acute distress.    Appearance: Normal appearance. She is not ill-appearing, toxic-appearing or diaphoretic.  Cardiovascular:     Rate and Rhythm: Normal rate and regular rhythm.     Pulses: Normal pulses.     Heart sounds: Normal heart sounds. No murmur heard. No friction rub. No gallop.   Pulmonary:     Effort: Pulmonary effort is normal. No respiratory distress.     Breath sounds: Normal breath sounds. No stridor. No wheezing, rhonchi or rales.  Chest:     Chest wall: No tenderness.  Musculoskeletal:     Right lower leg: Edema (mild nonpitting) present.     Left lower leg: Edema (mild nonpitting) present.  Skin:    General: Skin is warm and dry.     Capillary Refill: Capillary refill takes less than 2 seconds.  Neurological:     General: No focal deficit present.     Mental Status: She is alert and oriented to person, place, and time. Mental status is at baseline.  Psychiatric:        Mood and Affect: Mood normal.        Behavior: Behavior normal.        Thought Content: Thought content normal.        Judgment: Judgment normal.     BP 128/72   Pulse 96   Temp (!) 94 F (34.4 C)   Ht 5\' 5"  (1.651 m)   Wt 202 lb (91.6 kg)   SpO2 96%   BMI 33.61 kg/m  Wt Readings from Last 3 Encounters:  07/06/20 202 lb (91.6 kg)  05/10/20 160 lb (72.6 kg)  06/27/19 257 lb (116.6 kg)    Health Maintenance Due  Topic Date Due  . COVID-19 Vaccine (3 - Booster for Moderna series) 05/26/2020    There are no preventive care reminders to display for this patient.   Lab Results  Component Value Date   TSH 2.620 04/30/2018   Lab Results  Component Value Date   WBC 6.4 05/25/2020   HGB 11.1 05/25/2020   HCT 34.2 05/25/2020   MCV 85 05/25/2020   PLT 286 03/16/2019   Lab Results  Component Value Date   NA 138 05/25/2020   K 4.6 05/25/2020   CO2 29 05/25/2020    GLUCOSE 92 05/25/2020   BUN 15 05/25/2020   CREATININE 0.78 05/25/2020   BILITOT 0.3 05/25/2020   ALKPHOS 123 (H) 05/25/2020   AST 18 05/25/2020   ALT 10 05/25/2020   PROT 6.4 05/25/2020  ALBUMIN 4.2 05/25/2020   CALCIUM 10.6 (H) 05/25/2020   ANIONGAP 8 07/18/2016   Lab Results  Component Value Date   CHOL 167 05/25/2020   Lab Results  Component Value Date   HDL 54 05/25/2020   Lab Results  Component Value Date   LDLCALC 92 05/25/2020   Lab Results  Component Value Date   TRIG 116 05/25/2020   Lab Results  Component Value Date   CHOLHDL 3.1 05/25/2020   Lab Results  Component Value Date   HGBA1C 6.2 (H) 05/25/2020       Assessment & Plan:   Problem List Items Addressed This Visit   None   Visit Diagnoses    Flu vaccine need    -  Primary   Relevant Orders   Flu Vaccine QUAD High Dose(Fluad) (Completed)       No orders of the defined types were placed in this encounter.  PLAN  Favor chronic vascular disease rather than acute process. Encourage her to continue furosemide, monitor for signs of clot or infection, and discussed nonpharm  Return prn for pcp  Patient encouraged to call clinic with any questions, comments, or concerns.   Maximiano Coss, NP

## 2021-01-19 ENCOUNTER — Telehealth: Payer: Self-pay

## 2021-01-19 ENCOUNTER — Telehealth: Payer: Self-pay | Admitting: Emergency Medicine

## 2021-01-19 NOTE — Telephone Encounter (Signed)
Copied from [place] [id number]. Topic: General - Other >> Jan 19, 2021 10:09 AM Antonieta Iba C wrote: Reason for CRM: pt called in for  assistance. Pt says that she was a pt of [facility]. Pt says that she receive a b12 shot every month and is unsure of what to do. Pt would like to know if a provider could see her sooner than later so that she can stay on track with getting her shots?    Please assist pt further.   Called patient and LVM advising her I was calling from [facility] in regards to her recent request. Advised patient to call [phone] to discuss further.  If patient returns call she would need to est care with one of our providers prior to having a nurse visit for a B12 shot. Our establish care appointments are booked out until about August so if shes needing to be seen sooner she can be seen for a walk-in visit at the mobile medicine unit. Their calendar can be accessed at [email or web address]

## 2021-01-19 NOTE — Telephone Encounter (Signed)
When we open the patients chart it says that she has been dismissed from the practice but I do not see any notes or anything. Ellen Johnson and I wanted to check with you to make sure this is correct because the patient was trying to make an appointment. Let us know, thank you!

## 2021-01-19 NOTE — Telephone Encounter (Signed)
Patient was indeed dismissed from the practice.  Do not schedule with me or at this office please.  Thanks.

## 2021-01-20 NOTE — Telephone Encounter (Signed)
LVM to make patient aware

## 2021-01-20 NOTE — Telephone Encounter (Signed)
Looks like pt was dismissed from [facility] due to no shows.   I have spoken with Maximiano Coss in regard to continuing patient care.    He is fine continuing this care as long as patient cancels appointments appropriately.   I have removed the dismissals for [facility].  I have also left pt a vm to call back to schedule an appt.

## 2021-04-14 ENCOUNTER — Other Ambulatory Visit: Payer: Self-pay | Admitting: Registered Nurse

## 2021-04-14 DIAGNOSIS — Z1231 Encounter for screening mammogram for malignant neoplasm of breast: Secondary | ICD-10-CM

## 2021-05-08 ENCOUNTER — Emergency Department (HOSPITAL_COMMUNITY)
Admission: EM | Admit: 2021-05-08 | Discharge: 2021-05-08 | Disposition: A | Discharge status: Home / Self Care | Payer: Medicare (Managed Care) | Attending: Emergency Medicine | Admitting: Emergency Medicine

## 2021-05-08 ENCOUNTER — Other Ambulatory Visit: Payer: Self-pay

## 2021-05-08 ENCOUNTER — Encounter (HOSPITAL_COMMUNITY): Payer: Self-pay

## 2021-05-08 ENCOUNTER — Emergency Department (HOSPITAL_COMMUNITY): Payer: Medicare (Managed Care)

## 2021-05-08 DIAGNOSIS — Z79899 Other long term (current) drug therapy: Secondary | ICD-10-CM | POA: Insufficient documentation

## 2021-05-08 DIAGNOSIS — W01198A Fall on same level from slipping, tripping and stumbling with subsequent striking against other object, initial encounter: Secondary | ICD-10-CM | POA: Diagnosis not present

## 2021-05-08 DIAGNOSIS — W19XXXA Unspecified fall, initial encounter: Secondary | ICD-10-CM

## 2021-05-08 DIAGNOSIS — I10 Essential (primary) hypertension: Secondary | ICD-10-CM | POA: Insufficient documentation

## 2021-05-08 DIAGNOSIS — H1132 Conjunctival hemorrhage, left eye: Secondary | ICD-10-CM

## 2021-05-08 DIAGNOSIS — S0083XA Contusion of other part of head, initial encounter: Secondary | ICD-10-CM | POA: Insufficient documentation

## 2021-05-08 DIAGNOSIS — S0990XA Unspecified injury of head, initial encounter: Secondary | ICD-10-CM | POA: Diagnosis present

## 2021-05-08 MED ORDER — TETRACAINE HCL 0.5 % OP SOLN
2.0000 [drp] | Freq: Once | OPHTHALMIC | Status: AC
  Administered 2021-05-08: 2 [drp] via OPHTHALMIC
  Filled 2021-05-08: qty 4

## 2021-05-08 MED ORDER — FLUORESCEIN SODIUM 1 MG OP STRP
1.0000 | ORAL_STRIP | Freq: Once | OPHTHALMIC | Status: AC
  Administered 2021-05-08: 1 via OPHTHALMIC
  Filled 2021-05-08: qty 1

## 2021-05-08 NOTE — ED Provider Notes (Addendum)
[facility] EMERGENCY DEPARTMENT Provider Note   CSN: [id number] Arrival date & time: 05/08/21  0825     History No chief complaint on file.   Ellen Johnson is a 85 y.o. female.  Past medical history of anemia, hypertension.  Patient presents today to the ED after mechanical fall over a speed bump.  She landed on her head.  She hit her left temporal region.  She denies losing consciousness.  She has bloody drainage to her left eye.  She denies any vision loss or blurry vision.  She denies headache.  She does have foreign body sensation.  She denies any other aches or pains as a result from her fall.      Past Medical History:  Diagnosis Date   Anemia    Diverticulosis    GERD (gastroesophageal reflux disease)    HTN (hypertension)    Hx of colonic polyps     Patient Active Problem List   Diagnosis Date Noted   HTN (hypertension) 11/19/2017   GERD (gastroesophageal reflux disease) 02/27/2017   Diverticulosis    Colovaginal fistula 03/28/2016   DIVERTICULOSIS OF COLON 07/30/2008   DYSPHAGIA UNSPECIFIED 07/30/2008   Anemia 05/11/2008   History of colonic polyps 11/28/2007    Past Surgical History:  Procedure Laterality Date   ABDOMINAL HYSTERECTOMY     TAH BSO   APPENDECTOMY     CHOLECYSTECTOMY     FRACTURE SURGERY       OB History     Gravida  4   Para  4   Term      Preterm      AB      Living  4      SAB      IAB      Ectopic      Multiple      Live Births              Family History  Problem Relation Age of Onset   Stroke Mother    Colon cancer Father    Stroke Father    Stroke Maternal Grandmother    Breast cancer Paternal Grandmother     Social History   Tobacco Use   Smoking status: Never   Smokeless tobacco: Never  Vaping Use   Vaping Use: Never used  Substance Use Topics   Alcohol use: Yes    Comment: 4 oz weekly of wine   Drug use: No    Home Medications Prior to Admission medications    Medication Sig Start Date End Date Taking? Authorizing Provider  betamethasone dipropionate 0.05 % cream Apply topically 2 (two) times daily. 08/22/19   Zigmund Gottron, NP  camphor-menthol (SARNA) lotion Apply 1 application topically as needed for itching. APPLY EVERY MORNING 09/21/19   [name], MD  Ferrous Sulfate (IRON PO) Take by mouth daily.    [provider]  fish oil-omega-3 fatty acids 1000 MG capsule Take 1 g by mouth daily.     [provider]  GINSENG PO Take by mouth daily.    [provider]  HYDROcodone-acetaminophen (NORCO/VICODIN) 5-325 MG tablet Take 1-2 tablets by mouth every 6 (six) hours as needed. 05/10/20   Volanda Napoleon, PA-C  Multiple Vitamin (MULTIVITAMIN) tablet Take 1 tablet by mouth daily.     [provider]  omeprazole (PRILOSEC) 40 MG capsule Take 40 mg by mouth daily.     [provider]  ondansetron (ZOFRAN ODT) 4  MG disintegrating tablet Take 1 tablet (4 mg total) by mouth every 8 (eight) hours as needed for nausea or vomiting. 05/10/20   Providence Lanius A, PA-C  Probiotic Product (PROBIOTIC DAILY PO) Take by mouth.    [provider]  torsemide (DEMADEX) 10 MG tablet Take 1 tablet (10 mg total) by mouth daily. 09/21/19   [name], MD  vitamin C (ASCORBIC ACID) 500 MG tablet Take 500 mg by mouth daily. Takes only during winter months    [provider]  furosemide (LASIX) 20 MG tablet Take 1 tablet (20 mg total) by mouth daily. 06/27/19 09/21/19  [name], MD    Allergies    Valium [diazepam]  Review of Systems   Review of Systems  HENT:  Negative for facial swelling and sinus pain.   Eyes:  Positive for discharge and redness. Negative for photophobia, pain, itching and visual disturbance.  Respiratory:  Negative for cough, chest tightness and shortness of breath.   Cardiovascular:  Negative for chest pain, palpitations and leg swelling.  Gastrointestinal:   Negative for abdominal pain, constipation, diarrhea, nausea and vomiting.  Musculoskeletal:  Negative for back pain and neck pain.  Skin:  Negative for rash and wound.  Neurological:  Negative for dizziness, syncope, weakness, light-headedness and headaches.  All other systems reviewed and are negative.  Physical Exam Updated Vital Signs BP (!) 183/93 (BP Location: Right Arm)   Pulse 70   Temp 98.3 F (36.8 C) (Oral)   Resp 16   SpO2 98%   Physical Exam Vitals and nursing note reviewed.  Constitutional:      General: She is not in acute distress.    Appearance: Normal appearance. She is not ill-appearing, toxic-appearing or diaphoretic.  HENT:     Head: Normocephalic.     Comments: Bruising noted to left temporal region of face.  Eyes:     General: Gaze aligned appropriately. No visual field deficit or scleral icterus.       Right eye: No foreign body or discharge.     Extraocular Movements: Extraocular movements intact.     Right eye: No nystagmus.     Conjunctiva/sclera:     Right eye: Right conjunctiva is not injected. Hemorrhage present. No chemosis.    Pupils: Pupils are equal, round, and reactive to light.     Slit lamp exam:    Left eye: No corneal flare, corneal ulcer or foreign body.     Visual Fields: Right eye visual fields normal and left eye visual fields normal.  Pulmonary:     Effort: Pulmonary effort is normal. No respiratory distress.  Skin:    General: Skin is warm and dry.  Neurological:     General: No focal deficit present.     Mental Status: She is alert and oriented to person, place, and time.     Cranial Nerves: No cranial nerve deficit.     Sensory: No sensory deficit.     Motor: No weakness.  Psychiatric:        Mood and Affect: Mood normal.        Behavior: Behavior normal.    ED Results / Procedures / Treatments   Labs (all labs ordered are listed, but only abnormal results are displayed) Labs Reviewed - No data to  display  EKG None  Radiology CT Head Wo Contrast  Result Date: 05/08/2021 CLINICAL DATA:  Facial trauma, headache, fell over a speed bump this morning striking LEFT temporal region, denies loss  of consciousness EXAM: CT HEAD WITHOUT CONTRAST CT MAXILLOFACIAL WITHOUT CONTRAST CT CERVICAL SPINE WITHOUT CONTRAST TECHNIQUE: Multidetector CT imaging of the head, cervical spine, and maxillofacial structures were performed using the standard protocol without intravenous contrast. Multiplanar CT image reconstructions of the cervical spine and maxillofacial structures were also generated. COMPARISON:  CT head and cervical spine 05/10/2020 FINDINGS: CT HEAD FINDINGS Brain: Generalized atrophy. Normal ventricular morphology. No midline shift or mass effect. Small vessel chronic ischemic changes of deep cerebral white matter. No intracranial hemorrhage, mass lesion, evidence of acute infarction, or extra-axial fluid collection. Vascular: No hyperdense vessels Skull: Small LEFT frontal scalp hematoma.  Calvaria intact. Other: N/A CT MAXILLOFACIAL FINDINGS Osseous: Osseous mineralization grossly normal. Facial bones intact. TMJ alignment normal bilaterally. No facial bone fracture or bone destruction. Orbits: Bony orbits intact. Intraorbital soft tissue planes clear without fluid or pneumatosis Sinuses: Paranasal sinuses, mastoid air cells, and middle ear cavities clear bilaterally Soft tissues: Small LEFT periorbital and frontal scalp hematoma. CT CERVICAL SPINE FINDINGS Alignment: Normal Skull base and vertebrae: Mild osseous demineralization. Scattered disc space narrowing and endplate spur formation. Multilevel facet degenerative changes cervical spine. Vertebral body heights maintained. No fracture, subluxation, or bone destruction. Visualized skull base intact. Soft tissues and spinal canal: Prevertebral soft tissues normal thickness. No cervical soft tissue abnormalities. Disc levels:  No specific findings Upper  chest: Tips of lung apices clear Other: N/A IMPRESSION: Atrophy with small vessel chronic ischemic changes of deep cerebral white matter. No acute intracranial abnormalities. Degenerative disc and facet disease changes of the cervical spine. No acute cervical spine abnormalities. No acute facial bone abnormalities. Electronically Signed   By: Lavonia Dana M.D.   On: 05/08/2021 10:21   CT Cervical Spine Wo Contrast  Result Date: 05/08/2021 CLINICAL DATA:  Facial trauma, headache, fell over a speed bump this morning striking LEFT temporal region, denies loss of consciousness EXAM: CT HEAD WITHOUT CONTRAST CT MAXILLOFACIAL WITHOUT CONTRAST CT CERVICAL SPINE WITHOUT CONTRAST TECHNIQUE: Multidetector CT imaging of the head, cervical spine, and maxillofacial structures were performed using the standard protocol without intravenous contrast. Multiplanar CT image reconstructions of the cervical spine and maxillofacial structures were also generated. COMPARISON:  CT head and cervical spine 05/10/2020 FINDINGS: CT HEAD FINDINGS Brain: Generalized atrophy. Normal ventricular morphology. No midline shift or mass effect. Small vessel chronic ischemic changes of deep cerebral white matter. No intracranial hemorrhage, mass lesion, evidence of acute infarction, or extra-axial fluid collection. Vascular: No hyperdense vessels Skull: Small LEFT frontal scalp hematoma.  Calvaria intact. Other: N/A CT MAXILLOFACIAL FINDINGS Osseous: Osseous mineralization grossly normal. Facial bones intact. TMJ alignment normal bilaterally. No facial bone fracture or bone destruction. Orbits: Bony orbits intact. Intraorbital soft tissue planes clear without fluid or pneumatosis Sinuses: Paranasal sinuses, mastoid air cells, and middle ear cavities clear bilaterally Soft tissues: Small LEFT periorbital and frontal scalp hematoma. CT CERVICAL SPINE FINDINGS Alignment: Normal Skull base and vertebrae: Mild osseous demineralization. Scattered disc  space narrowing and endplate spur formation. Multilevel facet degenerative changes cervical spine. Vertebral body heights maintained. No fracture, subluxation, or bone destruction. Visualized skull base intact. Soft tissues and spinal canal: Prevertebral soft tissues normal thickness. No cervical soft tissue abnormalities. Disc levels:  No specific findings Upper chest: Tips of lung apices clear Other: N/A IMPRESSION: Atrophy with small vessel chronic ischemic changes of deep cerebral white matter. No acute intracranial abnormalities. Degenerative disc and facet disease changes of the cervical spine. No acute cervical spine abnormalities. No acute facial bone abnormalities.  Electronically Signed   By: Lavonia Dana M.D.   On: 05/08/2021 10:21   CT Maxillofacial WO CM  Result Date: 05/08/2021 CLINICAL DATA:  Facial trauma, headache, fell over a speed bump this morning striking LEFT temporal region, denies loss of consciousness EXAM: CT HEAD WITHOUT CONTRAST CT MAXILLOFACIAL WITHOUT CONTRAST CT CERVICAL SPINE WITHOUT CONTRAST TECHNIQUE: Multidetector CT imaging of the head, cervical spine, and maxillofacial structures were performed using the standard protocol without intravenous contrast. Multiplanar CT image reconstructions of the cervical spine and maxillofacial structures were also generated. COMPARISON:  CT head and cervical spine 05/10/2020 FINDINGS: CT HEAD FINDINGS Brain: Generalized atrophy. Normal ventricular morphology. No midline shift or mass effect. Small vessel chronic ischemic changes of deep cerebral white matter. No intracranial hemorrhage, mass lesion, evidence of acute infarction, or extra-axial fluid collection. Vascular: No hyperdense vessels Skull: Small LEFT frontal scalp hematoma.  Calvaria intact. Other: N/A CT MAXILLOFACIAL FINDINGS Osseous: Osseous mineralization grossly normal. Facial bones intact. TMJ alignment normal bilaterally. No facial bone fracture or bone destruction. Orbits:  Bony orbits intact. Intraorbital soft tissue planes clear without fluid or pneumatosis Sinuses: Paranasal sinuses, mastoid air cells, and middle ear cavities clear bilaterally Soft tissues: Small LEFT periorbital and frontal scalp hematoma. CT CERVICAL SPINE FINDINGS Alignment: Normal Skull base and vertebrae: Mild osseous demineralization. Scattered disc space narrowing and endplate spur formation. Multilevel facet degenerative changes cervical spine. Vertebral body heights maintained. No fracture, subluxation, or bone destruction. Visualized skull base intact. Soft tissues and spinal canal: Prevertebral soft tissues normal thickness. No cervical soft tissue abnormalities. Disc levels:  No specific findings Upper chest: Tips of lung apices clear Other: N/A IMPRESSION: Atrophy with small vessel chronic ischemic changes of deep cerebral white matter. No acute intracranial abnormalities. Degenerative disc and facet disease changes of the cervical spine. No acute cervical spine abnormalities. No acute facial bone abnormalities. Electronically Signed   By: Lavonia Dana M.D.   On: 05/08/2021 10:21    Procedures Procedures   Medications Ordered in ED Medications  fluorescein ophthalmic strip 1 strip (1 strip Left Eye Given 05/08/21 1349)  tetracaine (PONTOCAINE) 0.5 % ophthalmic solution 2 drop (2 drops Left Eye Given 05/08/21 1349)    ED Course  I have reviewed the triage vital signs and the nursing notes.  Pertinent labs & imaging results that were available during my care of the patient were reviewed by me and considered in my medical decision making (see chart for details).    MDM Rules/Calculators/A&P                         Patient presents to the ED after a fall where she hit her head. Well appearing, Appears to be in no acute distress, vitals stable.  Exam notable for likely a subconjunctival hemorrhage. No foreign body noted. EOMs intact. No pain with EOM. Vision intact. PERRLA.   Reviewed  imaging of CT head, cervical spine, and maxillofacial.  All with no acute findings.   Fluorescein dye evaluation with no evidence of corneal abrasion or ulcer. Patients eye exam is consistent with subconjunctival hemorrhage. Educated her on the benign nature of this diagnosis, however told her that she should be seen by her opthamologist to reevaluate eye injury.   Portions of this note were generated with [employer]. Dictation errors may occur despite best attempts at proofreading.  Final Clinical Impression(s) / ED Diagnoses Final diagnoses:  Fall, initial encounter  Subconjunctival hemorrhage of left eye  Rx / DC Orders ED Discharge Orders     None        Adolphus Birchwood, PA-C 05/08/21 1550    Sheila Oats 05/08/21 1755    Valarie Merino, MD 05/09/21 1011
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
## 2021-05-08 NOTE — Discharge Instructions (Addendum)
You were seen in the ED after a fall. Your imaging came back normal. You have been diagnosed with a subconjunctival hemorrhage of your left eye. Please see the handout included in your paperwork. You should follow up with your opthamologist for reevaluation of your eye.
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
## 2021-06-13 ENCOUNTER — Ambulatory Visit: Payer: Medicare HMO

## 2021-08-12 IMAGING — DX DG HUMERUS 2V *L*
3 series · 3 of 3 positions shown · non-contrast
Comparison: Chest x-ray 07/18/2016.

CLINICAL DATA: Fall.  Pain.

EXAM:
LEFT HUMERUS - 2+ VIEW

[humerus ap (1 of 2)]
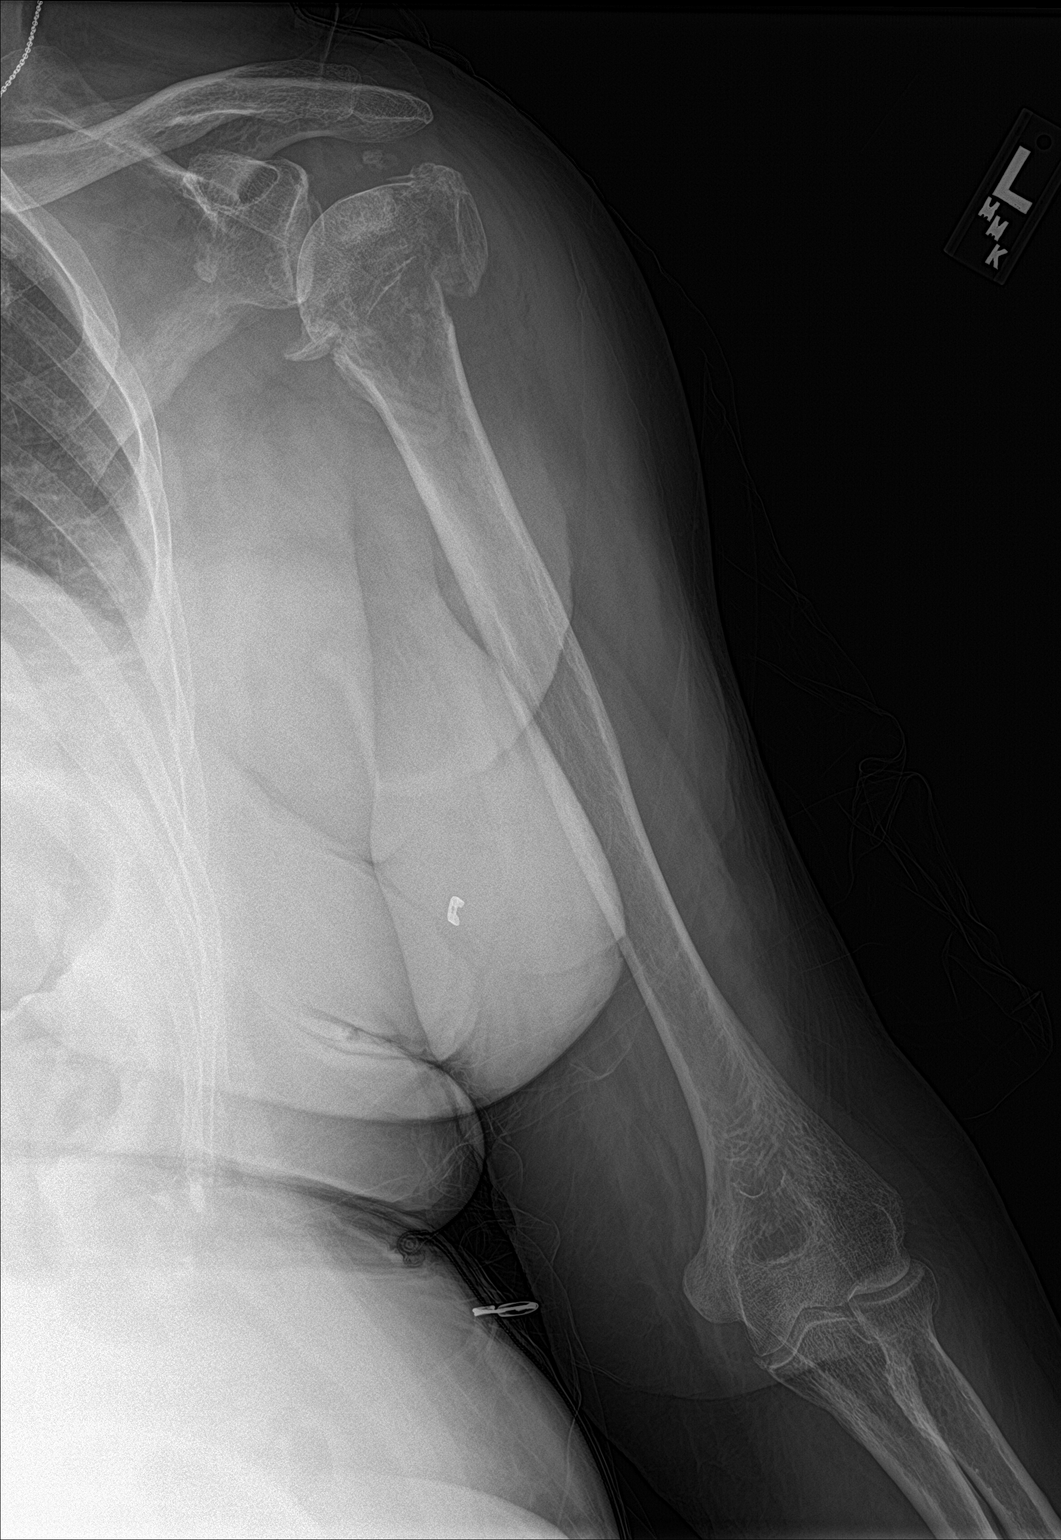

[humerus lat]
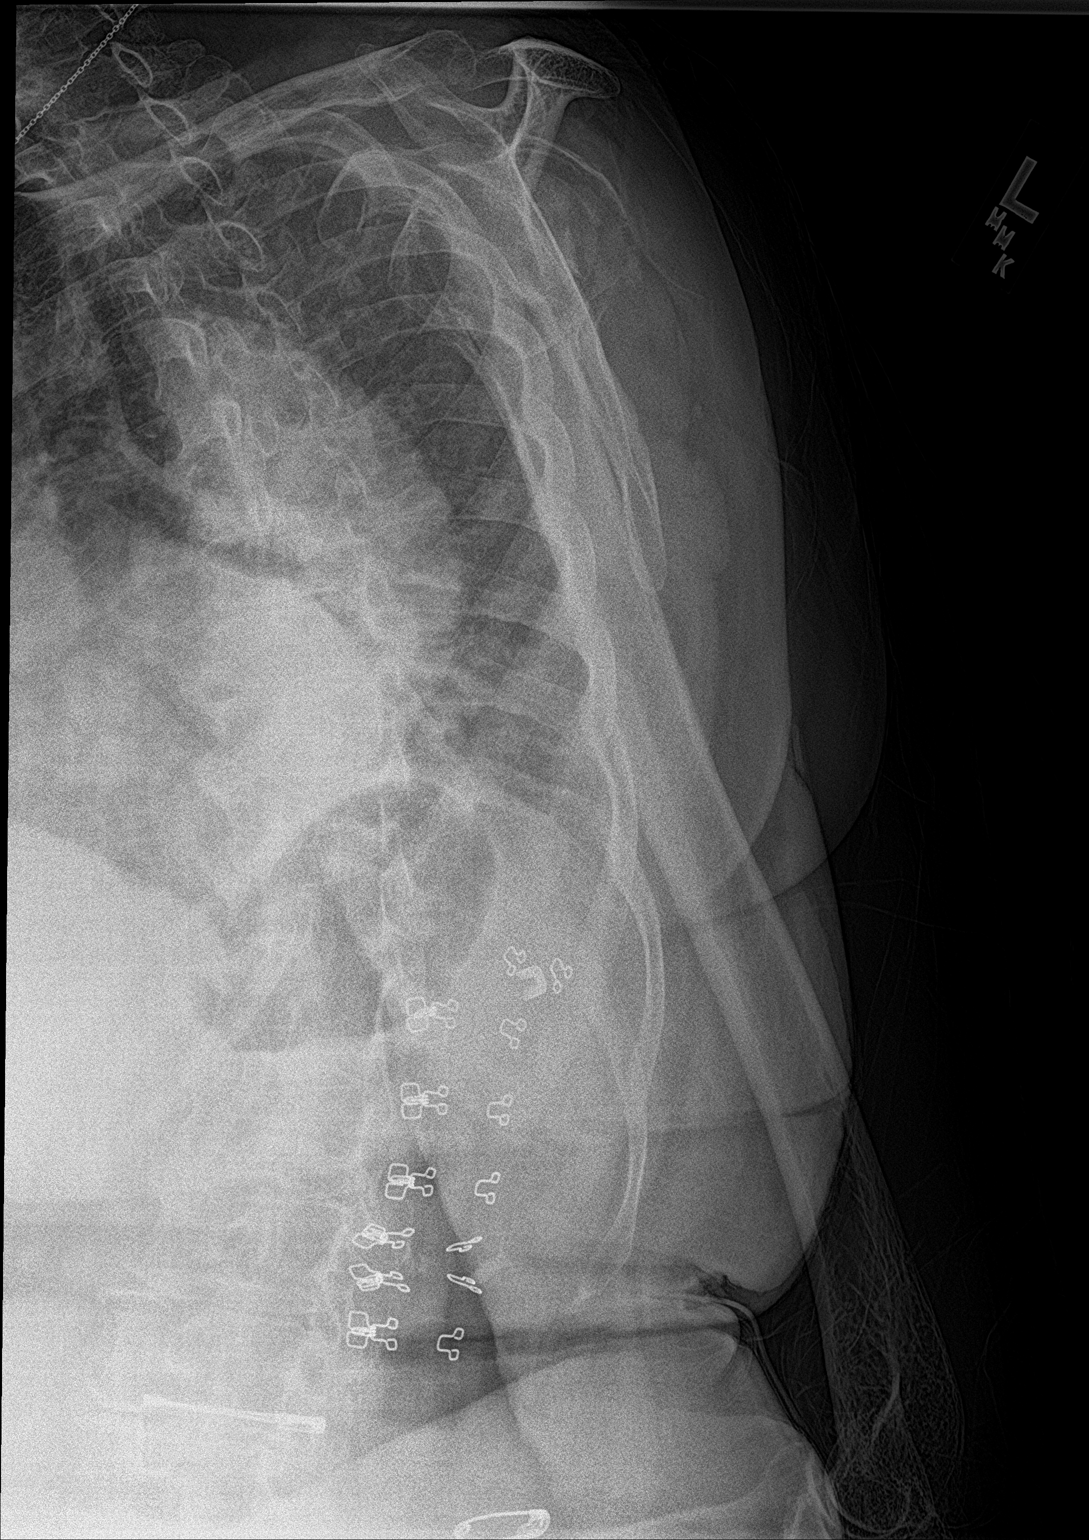

[humerus ap (2 of 2)]
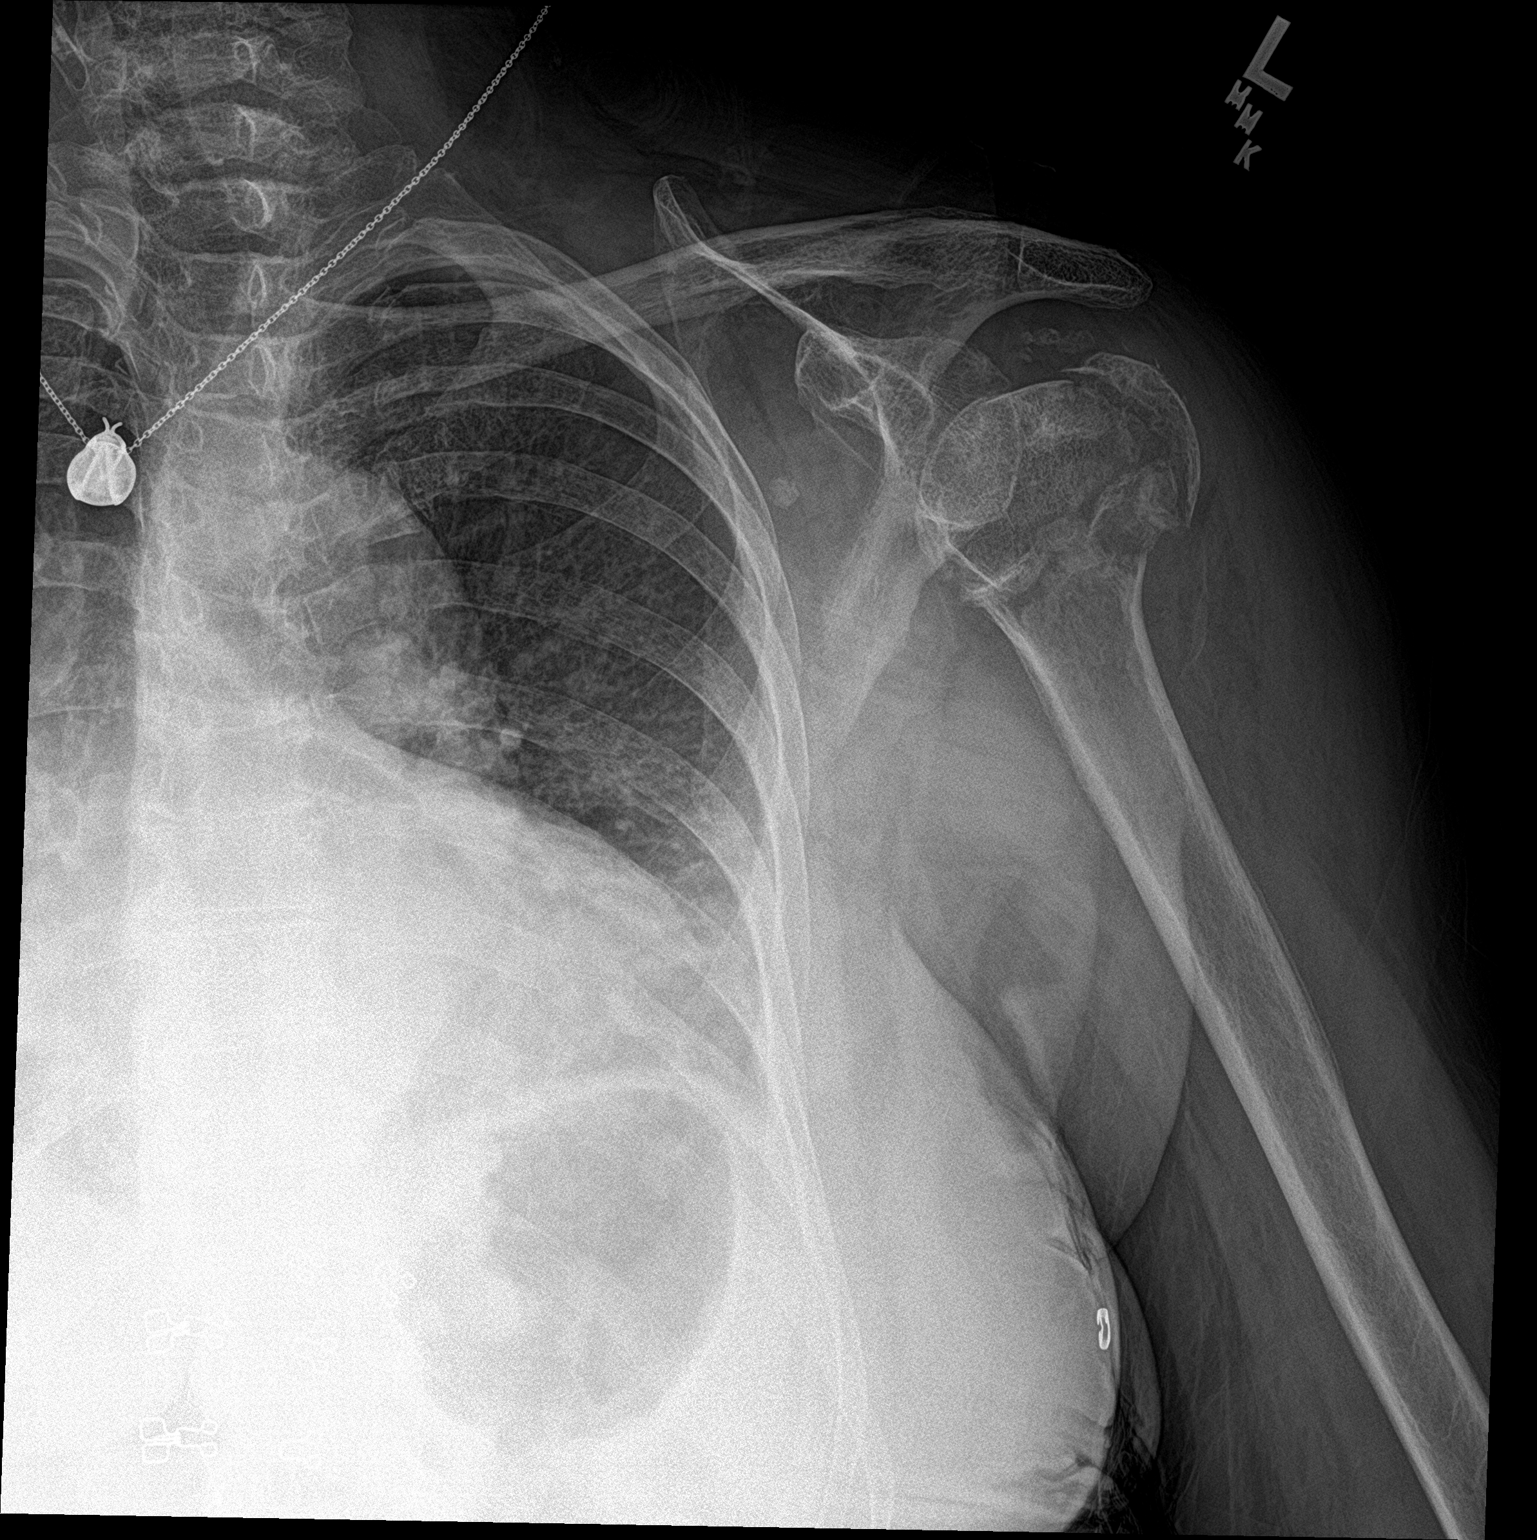

[3 of 3 positions shown; findings below may reference images not displayed]

FINDINGS: Comminuted fracture of the proximal left humerus noted. Displaced
fracture of the greater tuberosity noted. No evidence of
dislocation. Diffuse osteopenia degenerative change. Calcific
densities noted over supraspinatus tendon consistent calcific
supraspinatus tendinosis.
IMPRESSION: 1. Comminuted fracture of the proximal left humerus. Displaced
fracture of the greater tuberosity.

2. Diffuse osteopenia degenerative change. Calcific supraspinatus
tendinosis.

## 2021-09-29 ENCOUNTER — Other Ambulatory Visit: Payer: Self-pay

## 2021-09-29 ENCOUNTER — Encounter (HOSPITAL_BASED_OUTPATIENT_CLINIC_OR_DEPARTMENT_OTHER): Payer: Self-pay | Admitting: Emergency Medicine

## 2021-09-29 DIAGNOSIS — R200 Anesthesia of skin: Secondary | ICD-10-CM | POA: Diagnosis not present

## 2021-09-29 DIAGNOSIS — M7989 Other specified soft tissue disorders: Secondary | ICD-10-CM | POA: Diagnosis present

## 2021-09-29 DIAGNOSIS — R202 Paresthesia of skin: Secondary | ICD-10-CM | POA: Diagnosis not present

## 2021-09-29 DIAGNOSIS — R21 Rash and other nonspecific skin eruption: Secondary | ICD-10-CM | POA: Diagnosis not present
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
## 2021-09-30 ENCOUNTER — Emergency Department (HOSPITAL_BASED_OUTPATIENT_CLINIC_OR_DEPARTMENT_OTHER)
Admission: EM | Admit: 2021-09-30 | Discharge: 2021-09-30 | Disposition: A | Discharge status: Home / Self Care | Payer: Medicare HMO | Attending: Emergency Medicine | Admitting: Emergency Medicine

## 2021-09-30 ENCOUNTER — Emergency Department (HOSPITAL_BASED_OUTPATIENT_CLINIC_OR_DEPARTMENT_OTHER): Payer: Medicare HMO

## 2021-09-30 DIAGNOSIS — R200 Anesthesia of skin: Secondary | ICD-10-CM

## 2021-09-30 DIAGNOSIS — M7989 Other specified soft tissue disorders: Secondary | ICD-10-CM

## 2021-09-30 DIAGNOSIS — R202 Paresthesia of skin: Secondary | ICD-10-CM

## 2021-09-30 MED ORDER — CEPHALEXIN 500 MG PO CAPS: 500.0000 mg | ORAL_CAPSULE | Freq: Four times a day (QID) | ORAL | 0 refills | Status: DC

## 2021-09-30 MED ORDER — CEPHALEXIN 250 MG PO CAPS
1000.0000 mg | ORAL_CAPSULE | Freq: Once | ORAL | Status: AC
  Administered 2021-09-30: 1000 mg via ORAL
  Filled 2021-09-30: qty 4
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
## 2021-12-27 ENCOUNTER — Ambulatory Visit: Payer: Medicare HMO | Admitting: Sports Medicine

## 2021-12-28 ENCOUNTER — Ambulatory Visit
Admission: RE | Admit: 2021-12-28 | Discharge: 2021-12-28 | Disposition: A | Discharge status: Home / Self Care | Payer: Medicare HMO | Source: Ambulatory Visit | Attending: Sports Medicine | Admitting: Sports Medicine

## 2021-12-28 ENCOUNTER — Ambulatory Visit: Payer: Medicare HMO | Admitting: Sports Medicine

## 2021-12-28 VITALS — BP 151/68 | Ht 65.0 in | Wt 165.0 lb

## 2021-12-28 DIAGNOSIS — R29898 Other symptoms and signs involving the musculoskeletal system: Secondary | ICD-10-CM

## 2021-12-28 DIAGNOSIS — M25562 Pain in left knee: Secondary | ICD-10-CM | POA: Diagnosis not present
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
## 2022-01-30 ENCOUNTER — Ambulatory Visit (INDEPENDENT_AMBULATORY_CARE_PROVIDER_SITE_OTHER): Payer: Medicare HMO | Admitting: Sports Medicine

## 2022-01-30 VITALS — BP 145/57 | Ht 64.5 in | Wt 155.0 lb

## 2022-01-30 DIAGNOSIS — M25562 Pain in left knee: Secondary | ICD-10-CM | POA: Diagnosis not present

## 2022-01-30 DIAGNOSIS — M25512 Pain in left shoulder: Secondary | ICD-10-CM
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
## 2022-02-08 ENCOUNTER — Ambulatory Visit: Payer: Medicare HMO | Admitting: Sports Medicine

## 2022-02-20 ENCOUNTER — Ambulatory Visit: Payer: Medicare HMO | Admitting: Sports Medicine

## 2022-03-15 ENCOUNTER — Ambulatory Visit: Payer: Medicare HMO | Admitting: Sports Medicine

## 2022-04-03 ENCOUNTER — Ambulatory Visit (INDEPENDENT_AMBULATORY_CARE_PROVIDER_SITE_OTHER): Payer: Medicare HMO | Admitting: Sports Medicine

## 2022-04-03 ENCOUNTER — Ambulatory Visit: Payer: Self-pay

## 2022-04-03 VITALS — BP 126/78 | Ht 64.5 in | Wt 162.0 lb

## 2022-04-03 DIAGNOSIS — M25512 Pain in left shoulder: Secondary | ICD-10-CM

## 2022-04-04 NOTE — Progress Notes (Addendum)
Patient ID: Ellen Johnson, female   DOB: Nov 24, 1935, 86 y.o.   MRN: [id number]  Left shoulder ultrasound:  A limited left shoulder ultrasound was performed today.  Study was limited secondary to patient's inability to get into all of the appropriate positions for complete evaluation.  Visualized portion of the biceps tendon appears unremarkable in the bicipital groove in both short and long axis.  Subscapularis was poorly visualized due to the patient's inability to externally rotate her shoulder likely secondary to advanced glenohumeral DJD.  When evaluating for supraspinatus and infraspinatus I did not appreciate any obvious tendon in the subacromial space.  These findings correlate with probable chronically torn and retracted supraspinatus and infraspinatus tendons.  I have asked Ellen Johnson to get an x-ray of her left shoulder.  I suspect that she has advanced rotator cuff arthropathy in the shoulder.  She is not interested in any sort of injections nor operative interventions.  I will follow-up with her via telephone once I review her shoulder x-rays.  This note was dictated using Dragon naturally speaking software and may contain errors in syntax, spelling, or content which have not been identified prior to signing this note.

## 2022-04-13 ENCOUNTER — Emergency Department (HOSPITAL_BASED_OUTPATIENT_CLINIC_OR_DEPARTMENT_OTHER): Payer: Medicare HMO

## 2022-04-13 ENCOUNTER — Other Ambulatory Visit (HOSPITAL_BASED_OUTPATIENT_CLINIC_OR_DEPARTMENT_OTHER): Payer: Medicare HMO

## 2022-04-13 ENCOUNTER — Encounter (HOSPITAL_BASED_OUTPATIENT_CLINIC_OR_DEPARTMENT_OTHER): Payer: Self-pay | Admitting: Obstetrics and Gynecology

## 2022-04-13 ENCOUNTER — Other Ambulatory Visit: Payer: Self-pay

## 2022-04-13 ENCOUNTER — Inpatient Hospital Stay (HOSPITAL_BASED_OUTPATIENT_CLINIC_OR_DEPARTMENT_OTHER)
Admission: EM | Admit: 2022-04-13 | Discharge: 2022-04-16 | DRG: 063 | Disposition: A | Discharge status: Home Health | Payer: Medicare HMO | Attending: Neurology | Admitting: Neurology

## 2022-04-13 DIAGNOSIS — Z85828 Personal history of other malignant neoplasm of skin: Secondary | ICD-10-CM

## 2022-04-13 DIAGNOSIS — R29810 Facial weakness: Secondary | ICD-10-CM | POA: Diagnosis present

## 2022-04-13 DIAGNOSIS — Z90710 Acquired absence of both cervix and uterus: Secondary | ICD-10-CM

## 2022-04-13 DIAGNOSIS — R471 Dysarthria and anarthria: Secondary | ICD-10-CM | POA: Diagnosis present

## 2022-04-13 DIAGNOSIS — Z634 Disappearance and death of family member: Secondary | ICD-10-CM

## 2022-04-13 DIAGNOSIS — I63412 Cerebral infarction due to embolism of left middle cerebral artery: Secondary | ICD-10-CM | POA: Diagnosis present

## 2022-04-13 DIAGNOSIS — Z6831 Body mass index (BMI) 31.0-31.9, adult: Secondary | ICD-10-CM | POA: Diagnosis not present

## 2022-04-13 DIAGNOSIS — R4701 Aphasia: Secondary | ICD-10-CM | POA: Diagnosis present

## 2022-04-13 DIAGNOSIS — K219 Gastro-esophageal reflux disease without esophagitis: Secondary | ICD-10-CM | POA: Diagnosis present

## 2022-04-13 DIAGNOSIS — R482 Apraxia: Secondary | ICD-10-CM | POA: Diagnosis present

## 2022-04-13 DIAGNOSIS — R29704 NIHSS score 4: Secondary | ICD-10-CM | POA: Diagnosis not present

## 2022-04-13 DIAGNOSIS — Z888 Allergy status to other drugs, medicaments and biological substances status: Secondary | ICD-10-CM

## 2022-04-13 DIAGNOSIS — L409 Psoriasis, unspecified: Secondary | ICD-10-CM | POA: Diagnosis present

## 2022-04-13 DIAGNOSIS — I639 Cerebral infarction, unspecified: Secondary | ICD-10-CM

## 2022-04-13 DIAGNOSIS — Z9049 Acquired absence of other specified parts of digestive tract: Secondary | ICD-10-CM | POA: Diagnosis not present

## 2022-04-13 DIAGNOSIS — I10 Essential (primary) hypertension: Secondary | ICD-10-CM | POA: Diagnosis present

## 2022-04-13 DIAGNOSIS — R479 Unspecified speech disturbances: Secondary | ICD-10-CM

## 2022-04-13 DIAGNOSIS — E785 Hyperlipidemia, unspecified: Secondary | ICD-10-CM | POA: Diagnosis present

## 2022-04-13 DIAGNOSIS — Z823 Family history of stroke: Secondary | ICD-10-CM

## 2022-04-13 DIAGNOSIS — L309 Dermatitis, unspecified: Secondary | ICD-10-CM

## 2022-04-13 DIAGNOSIS — Z79899 Other long term (current) drug therapy: Secondary | ICD-10-CM

## 2022-04-13 DIAGNOSIS — R270 Ataxia, unspecified: Secondary | ICD-10-CM | POA: Diagnosis present

## 2022-04-13 DIAGNOSIS — R29703 NIHSS score 3: Secondary | ICD-10-CM | POA: Diagnosis present

## 2022-04-13 DIAGNOSIS — Z86010 Personal history of colonic polyps: Secondary | ICD-10-CM

## 2022-04-13 DIAGNOSIS — E669 Obesity, unspecified: Secondary | ICD-10-CM | POA: Diagnosis present

## 2022-04-13 DIAGNOSIS — E7800 Pure hypercholesterolemia, unspecified: Secondary | ICD-10-CM | POA: Diagnosis not present

## 2022-04-13 DIAGNOSIS — Z9282 Status post administration of tPA (rtPA) in a different facility within the last 24 hours prior to admission to current facility: Secondary | ICD-10-CM | POA: Diagnosis not present

## 2022-04-13 DIAGNOSIS — I6389 Other cerebral infarction: Secondary | ICD-10-CM | POA: Diagnosis not present

## 2022-04-13 LAB — DIFFERENTIAL
Abs Immature Granulocytes: 0.03 10*3/uL (ref 0.00–0.07)
Basophils Absolute: 0 10*3/uL (ref 0.0–0.1)
Basophils Relative: 1 %
Eosinophils Absolute: 0.2 10*3/uL (ref 0.0–0.5)
Eosinophils Relative: 3 %
Immature Granulocytes: 0 %
Lymphocytes Relative: 17 %
Lymphs Abs: 1.2 10*3/uL (ref 0.7–4.0)
Monocytes Absolute: 0.6 10*3/uL (ref 0.1–1.0)
Monocytes Relative: 8 %
Neutro Abs: 5 10*3/uL (ref 1.7–7.7)
Neutrophils Relative %: 71 %

## 2022-04-13 LAB — CBC
HCT: 38.1 % (ref 36.0–46.0)
Hemoglobin: 12.4 g/dL (ref 12.0–15.0)
MCH: 27.5 pg (ref 26.0–34.0)
MCHC: 32.5 g/dL (ref 30.0–36.0)
MCV: 84.5 fL (ref 80.0–100.0)
Platelets: 259 10*3/uL (ref 150–400)
RBC: 4.51 MIL/uL (ref 3.87–5.11)
RDW: 15.6 % — ABNORMAL HIGH (ref 11.5–15.5)
WBC: 7 10*3/uL (ref 4.0–10.5)
nRBC: 0 % (ref 0.0–0.2)

## 2022-04-13 LAB — COMPREHENSIVE METABOLIC PANEL
ALT: 9 U/L (ref 0–44)
AST: 16 U/L (ref 15–41)
Albumin: 4.4 g/dL (ref 3.5–5.0)
Alkaline Phosphatase: 81 U/L (ref 38–126)
Anion gap: 10 (ref 5–15)
BUN: 14 mg/dL (ref 8–23)
CO2: 23 mmol/L (ref 22–32)
Calcium: 9.8 mg/dL (ref 8.9–10.3)
Chloride: 102 mmol/L (ref 98–111)
Creatinine, Ser: 1.04 mg/dL — ABNORMAL HIGH (ref 0.44–1.00)
GFR, Estimated: 53 mL/min — ABNORMAL LOW (ref >60)
Glucose, Bld: 130 mg/dL — ABNORMAL HIGH (ref 70–99)
Potassium: 4.3 mmol/L (ref 3.5–5.1)
Sodium: 135 mmol/L (ref 135–145)
Total Bilirubin: 0.5 mg/dL (ref 0.3–1.2)
Total Protein: 7.7 g/dL (ref 6.5–8.1)

## 2022-04-13 LAB — APTT: aPTT: 29 seconds (ref 24–36)

## 2022-04-13 LAB — CBG MONITORING, ED: Glucose-Capillary: 135 mg/dL — ABNORMAL HIGH (ref 70–99)

## 2022-04-13 LAB — ETHANOL: Alcohol, Ethyl (B): <10 mg/dL (ref <10)

## 2022-04-13 LAB — MRSA NEXT GEN BY PCR, NASAL: MRSA by PCR Next Gen: NOT DETECTED

## 2022-04-13 LAB — PROTIME-INR
INR: 1 (ref 0.8–1.2)
Prothrombin Time: 13.4 seconds (ref 11.4–15.2)

## 2022-04-13 MED ORDER — IOHEXOL 350 MG/ML SOLN
100.0000 mL | Freq: Once | INTRAVENOUS | Status: AC | PRN
  Administered 2022-04-13: 75 mL via INTRAVENOUS

## 2022-04-13 MED ORDER — PANTOPRAZOLE SODIUM 40 MG IV SOLR
40.0000 mg | Freq: Every day | INTRAVENOUS | Status: DC
  Administered 2022-04-13: 40 mg via INTRAVENOUS
  Filled 2022-04-13: qty 10

## 2022-04-13 MED ORDER — ACETAMINOPHEN 650 MG RE SUPP: 650.0000 mg | RECTAL | Status: DC | PRN

## 2022-04-13 MED ORDER — TENECTEPLASE FOR STROKE
0.2500 mg/kg | PACK | Freq: Once | INTRAVENOUS | Status: AC
  Administered 2022-04-13: 19 mg via INTRAVENOUS
  Filled 2022-04-13: qty 10

## 2022-04-13 MED ORDER — ACETAMINOPHEN 160 MG/5ML PO SOLN: 650.0000 mg | ORAL | Status: DC | PRN

## 2022-04-13 MED ORDER — CLEVIDIPINE BUTYRATE 0.5 MG/ML IV EMUL: 0.0000 mg/h | INTRAVENOUS | Status: DC

## 2022-04-13 MED ORDER — SENNOSIDES-DOCUSATE SODIUM 8.6-50 MG PO TABS: 1.0000 | ORAL_TABLET | Freq: Every evening | ORAL | Status: DC | PRN

## 2022-04-13 MED ORDER — SODIUM CHLORIDE 0.9% FLUSH
3.0000 mL | Freq: Once | INTRAVENOUS | Status: AC
  Administered 2022-04-13: 3 mL via INTRAVENOUS
  Filled 2022-04-13: qty 3

## 2022-04-13 MED ORDER — STROKE: EARLY STAGES OF RECOVERY BOOK
Freq: Once | Status: AC
  Filled 2022-04-13 (×2): qty 1

## 2022-04-13 MED ORDER — SODIUM CHLORIDE 0.9 % IV SOLN: INTRAVENOUS | Status: DC

## 2022-04-13 MED ORDER — ACETAMINOPHEN 325 MG PO TABS
650.0000 mg | ORAL_TABLET | ORAL | Status: DC | PRN
  Administered 2022-04-14 – 2022-04-16 (×5): 650 mg via ORAL
  Filled 2022-04-13 (×5): qty 2

## 2022-04-13 MED ORDER — ADULT MULTIVITAMIN W/MINERALS CH
1.0000 | ORAL_TABLET | Freq: Every day | ORAL | Status: DC
  Administered 2022-04-14 – 2022-04-16 (×3): 1 via ORAL
  Filled 2022-04-13 (×4): qty 1
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
## 2022-04-13 NOTE — ED Notes (Signed)
Patient's rings and other jewelry sent with patient's daughter

## 2022-04-13 NOTE — ED Notes (Signed)
Patient's family brought back to 11.

## 2022-04-13 NOTE — Progress Notes (Signed)
Telestroke RN Note- 1071- stroke cart activation 1740- Pt taken to CT, Dr Leonie Man paged by TS RN [phone]- Dr Leonie Man on camera, assessed pt while in [place]. 1757- Order placed for TNK by Dr Sharlett Iles, while Dr Leonie Man discusses plan w/ family.  1810- Family agreed to TNK, NCCT- no ICH.  1812- verbal order to push bolus per Dr Leonie Man TNK bolus given  MRS 0 FANGD pos for aphasia

## 2022-04-13 NOTE — ED Provider Notes (Signed)
[facility] EMERGENCY DEPT Provider Note   CSN: [id number] Arrival date & time: 04/13/22  1731  An emergency department physician performed an initial assessment on this suspected stroke patient at 1743.  History  Chief Complaint  Patient presents with   Code Stroke    Ellen Johnson is a 86 y.o. female.  86 yo F with hx of HTN who presents to the ED with facial droop and slurred speech. At 3pm she was neurologically normal and speaking without difficulty. Reports she went in the shower and afterwards started having slurred speech so she was brought in for evaluation. Also reports facial droop. No arm or leg numbness or weakness. No headache. No trauma. No recent bleeding or surgeries. No hx of TBI or severe bleeding. Not on [facility] or AP therapy.     Past Medical History:  Diagnosis Date   Anemia    Diverticulosis    GERD (gastroesophageal reflux disease)    HTN (hypertension)    Hx of colonic polyps       Home Medications Prior to Admission medications   Medication Sig Start Date End Date Taking? Authorizing Provider  betamethasone dipropionate 0.05 % cream Apply topically 2 (two) times daily. 08/22/19   Zigmund Gottron, NP  camphor-menthol (SARNA) lotion Apply 1 application topically as needed for itching. APPLY EVERY MORNING 09/21/19   [name], MD  cephALEXin (KEFLEX) 500 MG capsule Take 1 capsule (500 mg total) by mouth 4 (four) times daily. 09/30/21   Mesner, Corene Cornea, MD  Ferrous Sulfate (IRON PO) Take by mouth daily.    [provider]  fish oil-omega-3 fatty acids 1000 MG capsule Take 1 g by mouth daily.     [provider]  GINSENG PO Take by mouth daily.    [provider]  HYDROcodone-acetaminophen (NORCO/VICODIN) 5-325 MG tablet Take 1-2 tablets by mouth every 6 (six) hours as needed. 05/10/20   Volanda Napoleon, PA-C  Multiple Vitamin (MULTIVITAMIN) tablet Take 1 tablet by mouth daily.     [provider]   omeprazole (PRILOSEC) 40 MG capsule Take 40 mg by mouth daily.     [provider]  ondansetron (ZOFRAN ODT) 4 MG disintegrating tablet Take 1 tablet (4 mg total) by mouth every 8 (eight) hours as needed for nausea or vomiting. 05/10/20   Providence Lanius A, PA-C  Probiotic Product (PROBIOTIC DAILY PO) Take by mouth.    [provider]  torsemide (DEMADEX) 10 MG tablet Take 1 tablet (10 mg total) by mouth daily. 09/21/19   [name], MD  vitamin C (ASCORBIC ACID) 500 MG tablet Take 500 mg by mouth daily. Takes only during winter months    [provider]  furosemide (LASIX) 20 MG tablet Take 1 tablet (20 mg total) by mouth daily. 06/27/19 09/21/19  [name], MD      Allergies    Valium [diazepam]    Review of Systems   Review of Systems  Physical Exam Updated Vital Signs BP 128/71   Pulse 79   Temp 98 F (36.7 C) (Oral)   Resp 20   Ht '5\' 5"'$  (1.651 m)   Wt 86.6 kg   SpO2 92%   BMI 31.77 kg/m  Physical Exam Vitals and nursing note reviewed.  Constitutional:      Appearance: She is well-developed.     Comments: Anxious appearing  HENT:     Head: Normocephalic and atraumatic.     Right Ear: External ear  normal.     Left Ear: External ear normal.     Nose: Nose normal.  Eyes:     Extraocular Movements: Extraocular movements intact.     Conjunctiva/sclera: Conjunctivae normal.     Pupils: Pupils are equal, round, and reactive to light.  Cardiovascular:     Rate and Rhythm: Normal rate and regular rhythm.     Heart sounds: No murmur heard. Pulmonary:     Effort: Pulmonary effort is normal. No respiratory distress.     Breath sounds: Normal breath sounds.  Abdominal:     General: Abdomen is flat. There is no distension.  Musculoskeletal:        General: No swelling.     Cervical back: Normal range of motion and neck supple.     Right lower leg: No edema.     Left lower leg: No edema.  Skin:    General: Skin is warm and dry.      Capillary Refill: Capillary refill takes less than 2 seconds.  Neurological:     Mental Status: She is alert.     Comments: MENTAL STATUS: AAOx3 VOICE: Severe dysarthria vs expressive aphasia noted CRANIAL NERVES: II: Pupils equal and reactive 4 mm BL, no RAPD, no VF deficits III, IV, VI: EOM intact, no gaze preference or deviation, no nystagmus. V: normal sensation to light touch in V1, V2, and V3 segments bilaterally VII: R sided facial droop VIII: normal hearing to speech and finger friction IX, X: normal palatal elevation, no uvular deviation XI: 5/5 head turn and 5/5 shoulder shrug bilaterally XII: midline tongue protrusion MOTOR: 5/5 strength in RUE and RLE 5/5 strength in RLE and LLE SENSORY: Normal sensation to light touch in all extremities   Psychiatric:        Mood and Affect: Mood normal.     ED Results / Procedures / Treatments   Labs (all labs ordered are listed, but only abnormal results are displayed) Labs Reviewed  CBC - Abnormal; Notable for the following components:      Result Value   RDW 15.6 (*)    All other components within normal limits  COMPREHENSIVE METABOLIC PANEL - Abnormal; Notable for the following components:   Glucose, Bld 130 (*)    Creatinine, Ser 1.04 (*)    GFR, Estimated 53 (*)    All other components within normal limits  BASIC METABOLIC PANEL - Abnormal; Notable for the following components:   CO2 20 (*)    Glucose, Bld 107 (*)    All other components within normal limits  HEMOGLOBIN A1C - Abnormal; Notable for the following components:   Hgb A1c MFr Bld 5.7 (*)    All other components within normal limits  CBG MONITORING, ED - Abnormal; Notable for the following components:   Glucose-Capillary 135 (*)    All other components within normal limits  MRSA NEXT GEN BY PCR, NASAL  PROTIME-INR  APTT  DIFFERENTIAL  ETHANOL  CBC    EKG None  Radiology CT ANGIO HEAD NECK W WO CM (CODE STROKE)  Result Date: 04/13/2022 CLINICAL  DATA:  Stroke code, stroke suspected EXAM: CT ANGIOGRAPHY HEAD AND NECK TECHNIQUE: Multidetector CT imaging of the head and neck was performed using the standard protocol during bolus administration of intravenous contrast. Multiplanar CT image reconstructions and MIPs were obtained to evaluate the vascular anatomy. Carotid stenosis measurements (when applicable) are obtained utilizing NASCET criteria, using the distal internal carotid diameter as the denominator. RADIATION DOSE REDUCTION: This  exam was performed according to the departmental dose-optimization program which includes automated exposure control, adjustment of the mA and/or kV according to patient size and/or use of iterative reconstruction technique. CONTRAST:  80m OMNIPAQUE IOHEXOL 350 MG/ML SOLN COMPARISON:  05/08/2021 CT head, no prior CTA FINDINGS: CT HEAD FINDINGS Brain: No evidence of acute infarct, hemorrhage, mass, mass effect, or midline shift. No hydrocephalus or extra-axial fluid collection. Vascular: No hyperdense vessel. Skull: Normal. Negative for fracture or focal lesion. Sinuses/Orbits: No acute finding. Other: The mastoid air cells are well aerated. CTA NECK FINDINGS Aortic arch: Standard branching. Imaged portion shows no evidence of aneurysm or dissection. No significant stenosis of the major arch vessel origins. Aortic atherosclerosis. Right carotid system: No evidence of dissection, occlusion, or hemodynamically significant stenosis (greater than 50%). Left carotid system: No evidence of dissection, occlusion, or hemodynamically significant stenosis (greater than 50%). Vertebral arteries: Left dominant. No evidence of dissection, occlusion, or hemodynamically significant stenosis (greater than 50%). Skeleton: No acute osseous abnormality. Degenerative changes in the cervical spine. Poor dentition with dental caries and periapical lucencies. Other neck: Multifocal subcentimeter lesions in the thyroid, for which no follow-up is  indicated. (Reference: J Am Coll Radiol. 2015 Feb;12(2): 143-50) Upper chest: Air trapping.  No pleural effusion. Review of the MIP images confirms the above findings CTA HEAD FINDINGS Anterior circulation: Both internal carotid arteries are patent to the termini, without significant stenosis. A1 segments patent. Normal anterior communicating artery. Anterior cerebral arteries are patent to their distal aspects. No M1 stenosis or occlusion. MCA branches perfused and symmetric. Posterior circulation: Vertebral arteries patent to the vertebrobasilar junction without stenosis. Basilar patent to its distal aspect. Superior cerebellar arteries patent proximally. Patent P1 segments. PCAs perfused to their distal aspects without stenosis. The right posterior communicating arteries is patent. Venous sinuses: As permitted by contrast timing, patent. Anatomic variants: None significant. Review of the MIP images confirms the above findings IMPRESSION: 1. No acute intracranial process. 2. No intracranial large vessel occlusion or significant stenosis. 3. No hemodynamically significant stenosis in the neck. Code stroke imaging results were communicated on 04/13/2022 at 6:10 pm to provider PSharlett Ilesvia telephone, who verbally acknowledged these results. Electronically Signed   By: AMerilyn BabaM.D.   On: 04/13/2022 18:16    Procedures Procedures   Medications Ordered in ED Medications  0.9 %  sodium chloride infusion ( Intravenous Not Given 04/13/22 2113)  acetaminophen (TYLENOL) tablet 650 mg (has no administration in time range)    Or  acetaminophen (TYLENOL) 160 MG/5ML solution 650 mg (has no administration in time range)    Or  acetaminophen (TYLENOL) suppository 650 mg (has no administration in time range)  senna-docusate (Senokot-S) tablet 1 tablet (has no administration in time range)  clevidipine (CLEVIPREX) infusion 0.5 mg/mL (has no administration in time range)  multivitamin with minerals tablet 1 tablet  (1 tablet Oral Given 04/14/22 1009)  Oral care mouth rinse (has no administration in time range)  Chlorhexidine Gluconate Cloth 2 % PADS 6 each (has no administration in time range)  torsemide (DEMADEX) tablet 10 mg (10 mg Oral Given 04/14/22 1009)  pantoprazole (PROTONIX) EC tablet 40 mg (40 mg Oral Given 04/14/22 1010)  ondansetron (ZOFRAN-ODT) disintegrating tablet 4 mg (4 mg Oral Given 04/14/22 1108)  sodium chloride flush (NS) 0.9 % injection 3 mL (3 mLs Intravenous Given 04/13/22 2114)  iohexol (OMNIPAQUE) 350 MG/ML injection 100 mL (75 mLs Intravenous Contrast Given 04/13/22 1752)  tenecteplase (TNKASE) injection for Stroke 19 mg (19  mg Intravenous Given 04/13/22 1812)   stroke: early stages of recovery book ( Does not apply Given 04/14/22 1007)  diphenhydrAMINE (BENADRYL) injection 12.5 mg (12.5 mg Intravenous Given 04/14/22 0227)    ED Course/ Medical Decision Making/ A&P Clinical Course as of 04/14/22 1133  Fri Apr 13, 2022  1740 Code stroke activated. [RP]  1806 Dr Leonie Man was consulted via tele neurology. He has evaluated the pt and reviewed the head CT. Recommends TNK at this time. Will go to neuro ICU with accepting physician as Dr Quinn Axe.  [RP]  4034 Discussed with Dr Quinn Axe who accepts the pt.  [RP]    Clinical Course User Index [RP] Fransico Meadow, MD                           Medical Decision Making 86 yo F with hx of HTN who presents to the ED with facial droop and slurred speech. No other focal deficits on my initial exam. Blood sugar was WNL and code stroke was called since patient was within window for TNK. Teleneurology performed their evaluation just prior to patient having non-contrast head CT which did not show bleed so TNK was administered after discussing with teleneurology attending who also reviewed the head CT. CTA did not show LVO and pt was discussed with Neuro ICU attending who agreed to admit the patient. The pt was then transferred to [facility] cone for further management.    Amount and/or Complexity of Data Reviewed Labs: ordered. Radiology: ordered.  Risk Prescription drug management. Decision regarding hospitalization.   CRITICAL CARE Performed by: Fransico Meadow  Total critical care time: 40 minutes  Critical care time was exclusive of separately billable procedures and treating other patients.  Critical care was necessary to treat or prevent imminent or life-threatening deterioration.  Critical care was time spent personally by me on the following activities: development of treatment plan with patient and/or surrogate as well as nursing, discussions with consultants, evaluation of patient's response to treatment, examination of patient, obtaining history from patient or surrogate, ordering and performing treatments and interventions, ordering and review of laboratory studies, ordering and review of radiographic studies, pulse oximetry and re-evaluation of patient's condition.   Final Clinical Impression(s) / ED Diagnoses Final diagnoses:  Acute ischemic stroke ([place])  Facial droop due to acute cerebrovascular accident (CVA) ([place])  Difficulty speaking    Rx / DC Orders ED Discharge Orders     None         Fransico Meadow, MD 04/14/22 1134

## 2022-04-13 NOTE — ED Notes (Signed)
Patient reports she has a metal rod in the right arm with pins that was put in approximately 23 years ago. Uknown about safety for MRI.

## 2022-04-13 NOTE — Consult Note (Signed)
[facility] Telemedicine Consult   Requesting Provider: Sallee Provencal Consult Participants: Patient, [facility] RN Lattie Haw, Dr. Sharlett Iles Location of the provider: [facility] Location of the patient: [facility] ER Telemetry neurologist login :5:35 PM This consult was provided via telemedicine with 2-way video and audio communication. The patient/family was informed that care would be provided in this way and agreed to receive care in this manner.    Chief Complaint: Code stroke  HPI: Ellen Johnson is a 86 year old Caucasian lady with past medical history of hypertension, psoriasis, skin cancer, gastroesophageal reflux disease, diverticulitis, colovaginal fistula and closed fracture of left proximal humerus who presented with sudden onset of difficulty speaking and not being able to verbalize after she went for a shower at 1500 hrs. today.  She was also noted to having right facial weakness.  She was able to walk and was able to follow commands well.  Upon arrival in the ER her expressive aphasia persisted with right facial droop.  NIH stroke scale was 3 on admission.  She was taken for emergent CT scan of the head which showed no acute abnormality.  She had severe expressive aphasia and facial droop but followed commands well and had no focal extremity weakness.  She has no prior history of strokes or TIAs. I spoke with patient's 2 daughters who were present in the ER Risk benefits of thrombolysis with IV TNK including 4 to 6% risk of intracerebral hemorrhage and systemic hemorrhage and found no obvious contraindications and family agreed for treatment with TNK.   LKW: 1500 hrs. TNK given?:  Yes IR Thrombectomy? No, no LVO noted on CT angio Modified Rankin Scale: 1-No significant post stroke disability and can perform usual duties with stroke symptoms Time of teleneurologist evaluation: 4196  Exam: Vitals:   04/13/22 1741  BP: 124/75  Pulse: 96  Resp: 16  Temp:  97.9 F (36.6 C)  SpO2: 96%    General:  [name] not in distress.  1A: Level of Consciousness - 0 1B: Ask Month and Age - 2 1C: 'Blink Eyes' & 'Squeeze Hands' - 0 2: Test Horizontal Extraocular Movements - 0 3: Test Visual Fields - 0 4: Test Facial Palsy - 1 5A: Test Left Arm Motor Drift - 0 5B: Test Right Arm Motor Drift - 0 6A: Test Left Leg Motor Drift - 0 6B: Test Right Leg Motor Drift - 0 7: Test Limb Ataxia - 0 8: Test Sensation - 0 9: Test Language/Aphasia- 2 10: Test Dysarthria - 1 11: Test Extinction/Inattention - 0 NIHSS score: 6   Imaging Reviewed: CT head noncontrast study shows no acute abnormality. CT angiogram of the brain and neck done emergently shows no LVO morning left carotid or MCA Labs reviewed in epic and pertinent values follow: CBG 135 mg percent.   Assessment: 86 year old Caucasian lady with sudden onset of expressive aphasia and right facial droop likely due to left MCA branch infarct of embolic etiology. Patient has presented within time window for IV thrombolysis and after review of brain imaging, patient exam and confirming history with patient's daughter who was available over the video conference and explaining risk benefits of thrombolysis with TNK including 4 to 6% risk of intracerebral hemorrhage and serious systemic bleeding they agreed to TNK administration. Patient is not a candidate for thrombectomy at this time as no LVO was noted Recommendations:  IV TNK as per stroke protocol.  Close neurological monitoring and strict blood pressure control as per postthrombolytic protocol.  Transfer to [facility] ICU bed for further monitoring and stroke work-up. Check MRI scan of the brain later, echocardiogram, telemetry monitoring, lipid profile, hemoglobin A1c Discussed with Dr. Su Monks neuro hospitalist on-call at [facility] who will admit the patient and stroke team will follow.     This patient  is receiving care for possible acute neurological changes. There was 50 minutes of care by this provider at the time of service, including time for direct evaluation via telemedicine, review of medical records, imaging studies and discussion of findings with providers, the patient and/or family.This patient is critically ill and at significant risk of neurological worsening, death and care requires constant monitoring of vital signs, hemodynamics,respiratory and cardiac monitoring, extensive review of multiple databases, frequent neurological assessment, discussion with family, other specialists and medical decision making of high complexity.I have made any additions or clarifications directly to the above note.This critical care time does not reflect procedure time, or teaching time or supervisory time of PA/NP/Med Resident etc but could involve care discussion time.  I spent 30 minutes of neurocritical care time  in the care of  this patient.      Antony Contras MD [facility] [phone]  If 7pm- 7am, please page neurology on call as listed in [place].

## 2022-04-13 NOTE — ED Triage Notes (Signed)
Patient reports to the ER for CODE Stroke. Patient last known normal was at 1500 when she went to take a shower. She was talking normal when she went to the shower and when she came out she could not verbalize anything appropriately. Notable right sided weakness in the face.

## 2022-04-14 ENCOUNTER — Inpatient Hospital Stay (HOSPITAL_COMMUNITY): Payer: Medicare HMO

## 2022-04-14 DIAGNOSIS — I6389 Other cerebral infarction: Secondary | ICD-10-CM | POA: Diagnosis not present

## 2022-04-14 DIAGNOSIS — I639 Cerebral infarction, unspecified: Secondary | ICD-10-CM | POA: Diagnosis not present

## 2022-04-14 DIAGNOSIS — R29810 Facial weakness: Secondary | ICD-10-CM

## 2022-04-14 DIAGNOSIS — R479 Unspecified speech disturbances: Secondary | ICD-10-CM

## 2022-04-14 LAB — ECHOCARDIOGRAM COMPLETE
AR max vel: 2.77 cm2
AV Area VTI: 2.66 cm2
AV Area mean vel: 2.89 cm2
AV Mean grad: 5 mmHg
AV Peak grad: 9.6 mmHg
Ao pk vel: 1.55 m/s
Area-P 1/2: 3.17 cm2
Height: 65 in
P 1/2 time: 496 msec
S' Lateral: 2.7 cm
Weight: 3054.69 oz

## 2022-04-14 LAB — BASIC METABOLIC PANEL
Anion gap: 10 (ref 5–15)
BUN: 10 mg/dL (ref 8–23)
CO2: 20 mmol/L — ABNORMAL LOW (ref 22–32)
Calcium: 9.7 mg/dL (ref 8.9–10.3)
Chloride: 105 mmol/L (ref 98–111)
Creatinine, Ser: 0.79 mg/dL (ref 0.44–1.00)
GFR, Estimated: >60 mL/min (ref >60)
Glucose, Bld: 107 mg/dL — ABNORMAL HIGH (ref 70–99)
Potassium: 4.2 mmol/L (ref 3.5–5.1)
Sodium: 135 mmol/L (ref 135–145)

## 2022-04-14 LAB — CBC
HCT: 38.2 % (ref 36.0–46.0)
Hemoglobin: 12.6 g/dL (ref 12.0–15.0)
MCH: 27.9 pg (ref 26.0–34.0)
MCHC: 33 g/dL (ref 30.0–36.0)
MCV: 84.7 fL (ref 80.0–100.0)
Platelets: 223 10*3/uL (ref 150–400)
RBC: 4.51 MIL/uL (ref 3.87–5.11)
RDW: 15.5 % (ref 11.5–15.5)
WBC: 6.7 10*3/uL (ref 4.0–10.5)
nRBC: 0 % (ref 0.0–0.2)

## 2022-04-14 LAB — HEMOGLOBIN A1C
Hgb A1c MFr Bld: 5.7 % — ABNORMAL HIGH (ref 4.8–5.6)
Mean Plasma Glucose: 116.89 mg/dL

## 2022-04-14 LAB — GLUCOSE, CAPILLARY: Glucose-Capillary: 105 mg/dL — ABNORMAL HIGH (ref 70–99)

## 2022-04-14 MED ORDER — TORSEMIDE 10 MG PO TABS
10.0000 mg | ORAL_TABLET | Freq: Every day | ORAL | Status: DC
  Administered 2022-04-14 – 2022-04-16 (×3): 10 mg via ORAL
  Filled 2022-04-14 (×3): qty 1

## 2022-04-14 MED ORDER — PANTOPRAZOLE SODIUM 40 MG PO TBEC
40.0000 mg | DELAYED_RELEASE_TABLET | Freq: Every day | ORAL | Status: DC
  Administered 2022-04-14 – 2022-04-16 (×3): 40 mg via ORAL
  Filled 2022-04-14 (×3): qty 1

## 2022-04-14 MED ORDER — CHLORHEXIDINE GLUCONATE CLOTH 2 % EX PADS
6.0000 | MEDICATED_PAD | Freq: Every day | CUTANEOUS | Status: DC
  Administered 2022-04-14 – 2022-04-16 (×3): 6 via TOPICAL

## 2022-04-14 MED ORDER — ONDANSETRON 4 MG PO TBDP
4.0000 mg | ORAL_TABLET | Freq: Three times a day (TID) | ORAL | Status: DC | PRN
  Administered 2022-04-14 – 2022-04-15 (×2): 4 mg via ORAL
  Filled 2022-04-14 (×2): qty 1

## 2022-04-14 MED ORDER — ORAL CARE MOUTH RINSE: 15.0000 mL | OROMUCOSAL | Status: DC | PRN

## 2022-04-14 MED ORDER — DIPHENHYDRAMINE HCL 50 MG/ML IJ SOLN
12.5000 mg | Freq: Once | INTRAMUSCULAR | Status: AC
  Administered 2022-04-14: 12.5 mg via INTRAVENOUS
  Filled 2022-04-14: qty 1

## 2022-04-14 NOTE — Evaluation (Signed)
Speech Language Pathology Evaluation Patient Details Name: Ellen Johnson MRN: [id number] DOB: 02/21/1936 Today's Date: 04/14/2022 Time: 7672-0947 SLP Time Calculation (min) (ACUTE ONLY): 35 min  Problem List:  Patient Active Problem List   Diagnosis Date Noted   Acute ischemic stroke ([place]) 04/13/2022   HTN (hypertension) 11/19/2017   GERD (gastroesophageal reflux disease) 02/27/2017   Diverticulosis    Colovaginal fistula 03/28/2016   DIVERTICULOSIS OF COLON 07/30/2008   DYSPHAGIA UNSPECIFIED 07/30/2008   Anemia 05/11/2008   History of colonic polyps 11/28/2007   Past Medical History:  Past Medical History:  Diagnosis Date   Anemia    Diverticulosis    GERD (gastroesophageal reflux disease)    HTN (hypertension)    Hx of colonic polyps    Past Surgical History:  Past Surgical History:  Procedure Laterality Date   ABDOMINAL HYSTERECTOMY     TAH BSO   APPENDECTOMY     CHOLECYSTECTOMY     FRACTURE SURGERY     HPI:  86 y.o. female who presents 8/4 with sudden onset difficulty speaking and a mild R facial droop.  Pt received TNK.  CT angio with no LVO; No acute intracranial process.  PMH includes:  Anemia, diverticulosis, GERD, HTN, recent Lt humerus fx, remote Rt UE fx, psoriasis. Pt to have MRI of brain completed later this date per chart review.   Assessment / Plan / Recommendation Clinical Impression  Pt presents with novel speech and language deficits this admission; Pt pending completion of MRI later this date per chart review. Pt with prominent dysarthria of speech including deficits in articulation, resonance, phonation, and vocal intensity, all of which negatively impacts communication intelligibility. Pt with reduced articulary precision of sounds, appeared to have hypernasality, dysphonia (question ability of vocal cord mobility possibly unilateral dysfunction) and reduced breath support for speech.  Pt aware of deficits and exhibited some frustration/concern about  long term impact. Oral motor exam notable for mild right facial droop. She denies any acute dysphagia. She also exhibits some aphasic like deficits, though dysarthria of speech was so prominent it impacted ability to fully understand some of patients verbal output. PLOF pt states son resides with her in townhome and works nightshift. She has 4 children locally, states very supportive. Basic cognitive tasks appeared intact, pt fully oriented. Further speech language and cognitive assessments indicated to assist with recovery. SLP to follow up.    SLP Assessment  SLP Recommendation/Assessment: Patient needs continued Speech Lanaguage Pathology Services SLP Visit Diagnosis: Dysarthria and anarthria (R47.1);Aphasia (R47.01)    Recommendations for follow up therapy are one component of a multi-disciplinary discharge planning process, led by the attending physician.  Recommendations may be updated based on patient status, additional functional criteria and insurance authorization.    Follow Up Recommendations  Home health SLP [facility] SLP vs outpatient SLP)    Assistance Recommended at Discharge  Intermittent Supervision/Assistance  Functional Status Assessment Patient has had a recent decline in their functional status and demonstrates the ability to make significant improvements in function in a reasonable and predictable amount of time.  Frequency and Duration min 2x/week  2 weeks      SLP Evaluation Cognition  Overall Cognitive Status: Difficult to assess (prominent dysarthria, all informal cognitive tasks appeared intact but speech deficits impair intelligbility for higher level cognitive tasks) Arousal/Alertness: Awake/alert Orientation Level: Oriented X4 Attention: Focused;Sustained;Divided Focused Attention: Appears intact Sustained Attention: Appears intact Divided Attention: Impaired (per PT notes, distraction noted) Memory: Appears intact Awareness: Appears intact Executive Function:  Self Monitoring;Organizing Behaviors: Lability (frustrated with speaking constraints) Safety/Judgment: Appears intact       Comprehension  Auditory Comprehension Overall Auditory Comprehension: Appears within functional limits for tasks assessed Commands: Within Functional Limits    Expression Expression Primary Mode of Expression: Verbal Verbal Expression Overall Verbal Expression: Impaired Initiation: Impaired Level of Generative/Spontaneous Verbalization: Phrase;Sentence Repetition: Impaired Level of Impairment: Phrase level;Word level Naming: Impairment Verbal Errors: Aware of errors;Phonemic paraphasias Pragmatics: No impairment Non-Verbal Means of Communication: Gestures Written Expression Dominant Hand: Right   Oral / Motor  Oral Motor/Sensory Function Overall Oral Motor/Sensory Function: Mild impairment Facial ROM: Reduced right Facial Symmetry: Abnormal symmetry right Facial Sensation: Within Functional Limits Mandible: Within Functional Limits Motor Speech Overall Motor Speech: Impaired Respiration: Impaired Level of Impairment: Phrase Phonation: Low vocal intensity (dysphonic) Resonance: Hypernasality Articulation: Impaired Level of Impairment: Word Intelligibility: Intelligibility reduced Word: 50-74% accurate Phrase: 50-74% accurate Sentence: 50-74% accurate Conversation: 50-74% accurate Effective Techniques: Slow rate;Over-articulate;Pause;Increased vocal intensity            Hayden Rasmussen MA, CCC-SLP Acute Rehabilitation Services   04/14/2022, 2:18 PM
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
## 2022-04-14 NOTE — Progress Notes (Signed)
PT Cancellation Note  Patient Details Name: KALEY JUTRAS MRN: [id number] DOB: [birth year]   Cancelled Treatment:    Reason Eval/Treat Not Completed: Active bedrest order. RN reporting pt will be on bedrest until 6 pm. Coordinated with RN to notify therapy if this plan changes. Will plan to follow-up tomorrow provided she remains on bedrest until later this afternoon.   Moishe Spice, PT, DPT Acute Rehabilitation Services  Office: [place] 04/14/2022, 8:27 AM

## 2022-04-14 NOTE — Progress Notes (Signed)
*  PRELIMINARY RESULTS* Echocardiogram 2D Echocardiogram has been performed.  Ellen Johnson 04/14/2022, 1:11 PM
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
## 2022-04-14 NOTE — Progress Notes (Addendum)
STROKE TEAM PROGRESS NOTE   INTERVAL HISTORY Patient presented to the hospital yesterday with sudden onset dysarthria and right facial droop.  She was given TNK at the outside hospital ED and transferred here for further care.  MRI to be performed later today.  Vitals:   04/14/22 0900 04/14/22 1000 04/14/22 1100 04/14/22 1115  BP: (!) 123/92 136/69  128/71  Pulse: 81 79  79  Resp: (!) 26 17 (!) 21 20  Temp:      TempSrc:      SpO2: 94% 92%  95%  Weight:      Height:       CBC:  Recent Labs  Lab 04/13/22 1745 04/14/22 0740  WBC 7.0 6.7  NEUTROABS 5.0  --   HGB 12.4 12.6  HCT 38.1 38.2  MCV 84.5 84.7  PLT 259 924   Basic Metabolic Panel:  Recent Labs  Lab 04/13/22 1745 04/14/22 0740  NA 135 135  K 4.3 4.2  CL 102 105  CO2 23 20*  GLUCOSE 130* 107*  BUN 14 10  CREATININE 1.04* 0.79  CALCIUM 9.8 9.7   Lipid Panel: No results for input(s): "CHOL", "TRIG", "HDL", "CHOLHDL", "VLDL", "LDLCALC" in the last 168 hours. HgbA1c:  Recent Labs  Lab 04/14/22 0740  HGBA1C 5.7*   Urine Drug Screen: No results for input(s): "LABOPIA", "COCAINSCRNUR", "LABBENZ", "AMPHETMU", "THCU", "LABBARB" in the last 168 hours.  Alcohol Level  Recent Labs  Lab 04/13/22 1745  ETH <10    IMAGING past 24 hours CT ANGIO HEAD NECK W WO CM (CODE STROKE)  Result Date: 04/13/2022 CLINICAL DATA:  Stroke code, stroke suspected EXAM: CT ANGIOGRAPHY HEAD AND NECK TECHNIQUE: Multidetector CT imaging of the head and neck was performed using the standard protocol during bolus administration of intravenous contrast. Multiplanar CT image reconstructions and MIPs were obtained to evaluate the vascular anatomy. Carotid stenosis measurements (when applicable) are obtained utilizing NASCET criteria, using the distal internal carotid diameter as the denominator. RADIATION DOSE REDUCTION: This exam was performed according to the departmental dose-optimization program which includes automated exposure control,  adjustment of the mA and/or kV according to patient size and/or use of iterative reconstruction technique. CONTRAST:  65m OMNIPAQUE IOHEXOL 350 MG/ML SOLN COMPARISON:  05/08/2021 CT head, no prior CTA FINDINGS: CT HEAD FINDINGS Brain: No evidence of acute infarct, hemorrhage, mass, mass effect, or midline shift. No hydrocephalus or extra-axial fluid collection. Vascular: No hyperdense vessel. Skull: Normal. Negative for fracture or focal lesion. Sinuses/Orbits: No acute finding. Other: The mastoid air cells are well aerated. CTA NECK FINDINGS Aortic arch: Standard branching. Imaged portion shows no evidence of aneurysm or dissection. No significant stenosis of the major arch vessel origins. Aortic atherosclerosis. Right carotid system: No evidence of dissection, occlusion, or hemodynamically significant stenosis (greater than 50%). Left carotid system: No evidence of dissection, occlusion, or hemodynamically significant stenosis (greater than 50%). Vertebral arteries: Left dominant. No evidence of dissection, occlusion, or hemodynamically significant stenosis (greater than 50%). Skeleton: No acute osseous abnormality. Degenerative changes in the cervical spine. Poor dentition with dental caries and periapical lucencies. Other neck: Multifocal subcentimeter lesions in the thyroid, for which no follow-up is indicated. (Reference: J Am Coll Radiol. 2015 Feb;12(2): 143-50) Upper chest: Air trapping.  No pleural effusion. Review of the MIP images confirms the above findings CTA HEAD FINDINGS Anterior circulation: Both internal carotid arteries are patent to the termini, without significant stenosis. A1 segments patent. Normal anterior communicating artery. Anterior cerebral arteries are patent to  their distal aspects. No M1 stenosis or occlusion. MCA branches perfused and symmetric. Posterior circulation: Vertebral arteries patent to the vertebrobasilar junction without stenosis. Basilar patent to its distal aspect.  Superior cerebellar arteries patent proximally. Patent P1 segments. PCAs perfused to their distal aspects without stenosis. The right posterior communicating arteries is patent. Venous sinuses: As permitted by contrast timing, patent. Anatomic variants: None significant. Review of the MIP images confirms the above findings IMPRESSION: 1. No acute intracranial process. 2. No intracranial large vessel occlusion or significant stenosis. 3. No hemodynamically significant stenosis in the neck. Code stroke imaging results were communicated on 04/13/2022 at 6:10 pm to provider Sharlett Iles via telephone, who verbally acknowledged these results. Electronically Signed   By: Merilyn Baba M.D.   On: 04/13/2022 18:16    PHYSICAL EXAM General:  Alert, well-nourished, well-developed patient in no acute distress Respiratory:  Regular, unlabored respirations on room air  NEURO:  Mental Status: AA&Ox3  Speech/Language: speech is with significant dysarthria.  Naming, repetition, fluency, and comprehension intact.  Cranial Nerves:  II: PERRL. Visual fields full.  III, IV, VI: EOMI. Eyelids elevate symmetrically.  V: Sensation is intact to light touch and symmetrical to face.  VII: Right facial droop present  VIII: hearing intact to voice. IX, X: Phonation is normal.  XII: tongue is midline without fasciculations. Motor: 5/5 strength to all muscle groups tested.  Tone: is normal and bulk is normal Sensation- Intact to light touch bilaterally.  Coordination: FTN intact bilaterally.No drift.  Gait- deferred     ASSESSMENT/PLAN Ellen Johnson is a 86 y.o. female with history of HTN, GERD, anemia, psoriasis and fracture of left humerus 2 years ago presenting with sudden onset dysarthria and right facial droop.  She was given TNK at the outside hospital ED and transferred here for further care.  MRI to be performed later today.  Stroke: possible left brain stroke s/p TNK, etiology unclear, MRI pending  Code  Stroke CT head No acute abnormality.  CTA head & neck No LVO or hemodynamically significant stenosis MRI  pending 2D Echo EF 60 to 65% LDL pending HgbA1c 5.7 VTE prophylaxis - SCDs No antithrombotic prior to admission, now on No antithrombotic as she is <24 hours from TNK administration Therapy recommendations: Home health PT Disposition:  pending  Hypertension Home meds:  none Stable Keep BP <180/105 Long-term BP goal normotensive  Hyperlipidemia Home meds:  none LDL pending, goal < 70 Add statin if indicated Continue statin at discharge  Other Stroke Risk Factors Advanced Age >/= 43  Obesity, Body mass index is 31.77 kg/m., BMI >/= 30 associated with increased stroke risk, recommend weight loss, diet and exercise as appropriate   Other Active Problems Psoriasis Colovaginal fistula Left humeral fracture in 04/2020 Remote right radius and ulna fracture status post rod fixation  Hospital day # [place] , MSN, AGACNP-BC [facility] See Amion for schedule and pager information 04/14/2022 12:33 PM  ATTENDING NOTE: I reviewed above note and agree with the assessment and plan. Pt was seen and examined.   86 year old female with history of hypertension, psoriasis, colovaginal fistula, left humerus fracture 2 years ago admitted for severe dysarthria and right facial droop.  CT no acute abnormality.  Status post TNK.  CT head negative unremarkable.  MRI pending.  EF 60 to 65%.  LDL pending, A1c 5.7.  Creatinine 1.04-0.79.  On exam, patient reclining in bed, awake alert orientated x3, no aphasia, follows simple commands,  able to write and read.  However, severe dysarthria and most time intangible.  Visual field full, no gaze palsy, mild right facial droop.  Moving all extremities symmetrical.  Sensation symmetrical, finger-to-nose intact.  Etiology for patient's symptoms concerning for lacunar stroke, currently MRI pending.  Will consider antiplatelet  therapy after MRI if no bleeding.  Will consider statin if LDL also at goal.  Passed swallow on diet.  PT/OT recommend home health.  She will for sure need speech therapy too.   For detailed assessment and plan, please refer to above/below as I have made changes wherever appropriate.   Ellen Hawking, MD PhD Stroke Neurology 04/14/2022 5:46 PM  This patient is critically ill due to likely acute stroke status post TNK and at significant risk of neurological worsening, death form recurrent stroke, hemorrhagic transformation, bleeding from TNK. This patient's care requires constant monitoring of vital signs, hemodynamics, respiratory and cardiac monitoring, review of multiple databases, neurological assessment, discussion with family, other specialists and medical decision making of high complexity. I spent 35 minutes of neurocritical care time in the care of this patient.      To contact Stroke Continuity provider, please refer to [email or web address] After hours, contact General Neurology

## 2022-04-14 NOTE — Progress Notes (Signed)
RN called to pts bedside. Pt states bugs on top blanket. RN did note two visible bugs on top blanket.  Blanket, all sheets, and pt gown removed and put in biohazard bag.  New linen put on pts bed, pt given new gown.  All pts belongings put in bags.  RN was able to put one bug in specimen container.  Charge nurse notified.  Will await further instructions.

## 2022-04-15 ENCOUNTER — Inpatient Hospital Stay (HOSPITAL_COMMUNITY): Payer: Medicare HMO

## 2022-04-15 DIAGNOSIS — Z9282 Status post administration of tPA (rtPA) in a different facility within the last 24 hours prior to admission to current facility: Secondary | ICD-10-CM | POA: Diagnosis not present

## 2022-04-15 DIAGNOSIS — I639 Cerebral infarction, unspecified: Secondary | ICD-10-CM

## 2022-04-15 DIAGNOSIS — R479 Unspecified speech disturbances: Secondary | ICD-10-CM | POA: Diagnosis not present

## 2022-04-15 LAB — BASIC METABOLIC PANEL
Anion gap: 9 (ref 5–15)
BUN: 11 mg/dL (ref 8–23)
CO2: 24 mmol/L (ref 22–32)
Calcium: 9.4 mg/dL (ref 8.9–10.3)
Chloride: 102 mmol/L (ref 98–111)
Creatinine, Ser: 0.79 mg/dL (ref 0.44–1.00)
GFR, Estimated: >60 mL/min (ref >60)
Glucose, Bld: 103 mg/dL — ABNORMAL HIGH (ref 70–99)
Potassium: 4 mmol/L (ref 3.5–5.1)
Sodium: 135 mmol/L (ref 135–145)

## 2022-04-15 LAB — LIPID PANEL
Cholesterol: 178 mg/dL (ref 0–200)
HDL: 53 mg/dL (ref >40)
LDL Cholesterol: 101 mg/dL — ABNORMAL HIGH (ref 0–99)
Total CHOL/HDL Ratio: 3.4 RATIO
Triglycerides: 122 mg/dL (ref <150)
VLDL: 24 mg/dL (ref 0–40)

## 2022-04-15 LAB — CBC
HCT: 37.9 % (ref 36.0–46.0)
Hemoglobin: 12.5 g/dL (ref 12.0–15.0)
MCH: 27.9 pg (ref 26.0–34.0)
MCHC: 33 g/dL (ref 30.0–36.0)
MCV: 84.6 fL (ref 80.0–100.0)
Platelets: 225 10*3/uL (ref 150–400)
RBC: 4.48 MIL/uL (ref 3.87–5.11)
RDW: 15.5 % (ref 11.5–15.5)
WBC: 9.1 10*3/uL (ref 4.0–10.5)
nRBC: 0 % (ref 0.0–0.2)

## 2022-04-15 MED ORDER — ASPIRIN 81 MG PO TBEC
81.0000 mg | DELAYED_RELEASE_TABLET | Freq: Every day | ORAL | Status: DC
  Administered 2022-04-15 – 2022-04-16 (×2): 81 mg via ORAL
  Filled 2022-04-15 (×2): qty 1

## 2022-04-15 MED ORDER — DOCUSATE SODIUM 100 MG PO CAPS
100.0000 mg | ORAL_CAPSULE | Freq: Every day | ORAL | Status: DC | PRN
  Administered 2022-04-15: 100 mg via ORAL
  Filled 2022-04-15: qty 1

## 2022-04-15 MED ORDER — CLOPIDOGREL BISULFATE 75 MG PO TABS
75.0000 mg | ORAL_TABLET | Freq: Every day | ORAL | Status: DC
  Administered 2022-04-15 – 2022-04-16 (×2): 75 mg via ORAL
  Filled 2022-04-15 (×2): qty 1

## 2022-04-15 MED ORDER — SENNOSIDES-DOCUSATE SODIUM 8.6-50 MG PO TABS
1.0000 | ORAL_TABLET | Freq: Every day | ORAL | Status: DC | PRN
  Administered 2022-04-15: 1 via ORAL
  Filled 2022-04-15: qty 1

## 2022-04-15 MED ORDER — BISACODYL 10 MG RE SUPP
10.0000 mg | Freq: Once | RECTAL | Status: AC
Start: 2022-04-15 — End: 2022-04-15
  Administered 2022-04-15: 10 mg via RECTAL
  Filled 2022-04-15: qty 1

## 2022-04-15 MED ORDER — BISACODYL 5 MG PO TBEC
10.0000 mg | DELAYED_RELEASE_TABLET | Freq: Every day | ORAL | Status: DC | PRN
  Administered 2022-04-15: 10 mg via ORAL
  Filled 2022-04-15: qty 2

## 2022-04-15 MED ORDER — ROSUVASTATIN CALCIUM 20 MG PO TABS
20.0000 mg | ORAL_TABLET | Freq: Every day | ORAL | Status: DC
  Administered 2022-04-15 – 2022-04-16 (×2): 20 mg via ORAL
  Filled 2022-04-15 (×2): qty 1

## 2022-04-15 NOTE — Progress Notes (Signed)
VASCULAR LAB    Bilateral lower extremity venous duplex has been performed.  See CV proc for preliminary results.   Jeanetta Alonzo, RVT 04/15/2022, 11:19 AM

## 2022-04-15 NOTE — Progress Notes (Signed)
Physical Therapy Treatment Patient Details Name: Ellen Johnson MRN: [id number] DOB: 13-Mar-1936 Today's Date: 04/15/2022   History of Present Illness 86 y.o. female who presents 8/4 with sudden onset difficulty speaking and a mild R facial droop.  Pt received TNK.  CT angio with no LVO; No acute intracranial process.  PMH includes:  Anemia, diverticulosis, GERD, HTN, recent Lt humerus fx, remote Rt UE fx, psoriasis    PT Comments    Pt is making good progress, ambulating an increased distance of ~140 ft with a SPC at a min guard assist level. She reports fear of falling, but did not have LOB. She does display balance deficits that do place her at risk for falling though. Will continue to follow acutely. Current recommendations remain appropriate.     Recommendations for follow up therapy are one component of a multi-disciplinary discharge planning process, led by the attending physician.  Recommendations may be updated based on patient status, additional functional criteria and insurance authorization.  Follow Up Recommendations  Home health PT     Assistance Recommended at Discharge Intermittent Supervision/Assistance  Patient can return home with the following A little help with bathing/dressing/bathroom;A little help with walking and/or transfers;Assistance with cooking/housework;Direct supervision/assist for medications management;Direct supervision/assist for financial management;Assist for transportation   Equipment Recommendations  None recommended by PT    Recommendations for Other Services       Precautions / Restrictions Precautions Precautions: Fall Restrictions Weight Bearing Restrictions: No     Mobility  Bed Mobility Overal bed mobility: Needs Assistance Bed Mobility: Supine to Sit     Supine to sit: Supervision, HOB elevated     General bed mobility comments: Supervision for safety, extra time and use of bed functions to sit up EOB.    Transfers Overall  transfer level: Needs assistance Equipment used: Straight cane Transfers: Sit to/from Stand Sit to Stand: Min guard           General transfer comment: Extra time and multiple attempts to gain momentum to stand from EOB, min guard for safety    Ambulation/Gait Ambulation/Gait assistance: Min guard Gait Distance (Feet): 140 Feet Assistive device: Straight cane Gait Pattern/deviations: Step-through pattern, Decreased stride length, Wide base of support Gait velocity: reduced Gait velocity interpretation: <1.31 ft/sec, indicative of household ambulator   General Gait Details: Pt with slow, but fairly steady gait with wide BOS and lateral trunk flexion each step. No LOB, min guard for safety, even with changing head positions. Pt stopping to talk often as she had difficulty dual tasking.   Stairs             Wheelchair Mobility    Modified Rankin (Stroke Patients Only) Modified Rankin (Stroke Patients Only) Pre-Morbid Rankin Score: Slight disability Modified Rankin: Slight disability     Balance Overall balance assessment: Needs assistance Sitting-balance support: Feet supported Sitting balance-Leahy Scale: Good     Standing balance support: Single extremity supported Standing balance-Leahy Scale: Poor Standing balance comment: Reliant on SPC                            Cognition Arousal/Alertness: Awake/alert Behavior During Therapy: WFL for tasks assessed/performed (crying at times) Overall Cognitive Status: Difficult to assess (prominent dysarthria, all informal cognitive tasks appeared intact but speech deficits impair intelligbility for higher level cognitive tasks) Area of Impairment: Attention  Current Attention Level: Alternating           General Comments: Pt unable to dual task, having to stop ambulating to talk        Exercises      General Comments General comments (skin integrity, edema, etc.): VSS       Pertinent Vitals/Pain Pain Assessment Pain Assessment: Faces Faces Pain Scale: Hurts even more Pain Location: L shoulder (chronic) Pain Descriptors / Indicators: Discomfort, Guarding, Grimacing Pain Intervention(s): Limited activity within patient's tolerance, Monitored during session, Repositioned    Home Living                          Prior Function            PT Goals (current goals can now be found in the care plan section) Acute Rehab PT Goals Patient Stated Goal: to improve PT Goal Formulation: With patient Time For Goal Achievement: 04/28/22 Potential to Achieve Goals: Good Progress towards PT goals: Progressing toward goals    Frequency    Min 4X/week      PT Plan Current plan remains appropriate    Co-evaluation              AM-PAC PT "6 Clicks" Mobility   Outcome Measure  Help needed turning from your back to your side while in a flat bed without using bedrails?: A Little Help needed moving from lying on your back to sitting on the side of a flat bed without using bedrails?: A Little Help needed moving to and from a bed to a chair (including a wheelchair)?: A Little Help needed standing up from a chair using your arms (e.g., wheelchair or bedside chair)?: A Little Help needed to walk in hospital room?: A Little Help needed climbing 3-5 steps with a railing? : A Little 6 Click Score: 18    End of Session Equipment Utilized During Treatment: Gait belt Activity Tolerance: Patient tolerated treatment well Patient left: with call bell/phone within reach;in chair;with chair alarm set Nurse Communication: Mobility status PT Visit Diagnosis: Unsteadiness on feet (R26.81);Other abnormalities of gait and mobility (R26.89);Other symptoms and signs involving the nervous system (R29.898)     Time: 2248-2500 PT Time Calculation (min) (ACUTE ONLY): 22 min  Charges:  $Gait Training: 8-22 mins                     Moishe Spice, PT, DPT Acute  Rehabilitation Services  Office: [facility] 04/15/2022, 2:30 PM

## 2022-04-15 NOTE — Progress Notes (Addendum)
STROKE TEAM PROGRESS NOTE   INTERVAL HISTORY Patient is seen in her room with no family at the bedside.  Her vital signs have been stable, and her neurological exam is stable.  Significant dysarthria continues but has improved this morning.  Vitals:   04/15/22 0500 04/15/22 0600 04/15/22 0700 04/15/22 0800  BP: 110/86 124/69 (!) 141/80   Pulse: 76 74 65   Resp: (!) 23 15 (!) 23   Temp:    98.7 F (37.1 C)  TempSrc:      SpO2: 91% 92% 93%   Weight:      Height:       CBC:  Recent Labs  Lab 04/13/22 1745 04/14/22 0740 04/15/22 0216  WBC 7.0 6.7 9.1  NEUTROABS 5.0  --   --   HGB 12.4 12.6 12.5  HCT 38.1 38.2 37.9  MCV 84.5 84.7 84.6  PLT 259 223 505    Basic Metabolic Panel:  Recent Labs  Lab 04/14/22 0740 04/15/22 0216  NA 135 135  K 4.2 4.0  CL 105 102  CO2 20* 24  GLUCOSE 107* 103*  BUN 10 11  CREATININE 0.79 0.79  CALCIUM 9.7 9.4    Lipid Panel:  Recent Labs  Lab 04/15/22 0216  CHOL 178  TRIG 122  HDL 53  CHOLHDL 3.4  VLDL 24  LDLCALC 101*   HgbA1c:  Recent Labs  Lab 04/14/22 0740  HGBA1C 5.7*    Urine Drug Screen: No results for input(s): "LABOPIA", "COCAINSCRNUR", "LABBENZ", "AMPHETMU", "THCU", "LABBARB" in the last 168 hours.  Alcohol Level  Recent Labs  Lab 04/13/22 1745  ETH <10     IMAGING past 24 hours MR BRAIN WO CONTRAST  Result Date: 04/14/2022 CLINICAL DATA:  Stroke follow-up EXAM: MRI HEAD WITHOUT CONTRAST TECHNIQUE: Multiplanar, multiecho pulse sequences of the brain and surrounding structures were obtained without intravenous contrast. COMPARISON:  None Available. FINDINGS: Brain: Multifocal acute/early subacute ischemia within the left MCA territory. No acute or chronic hemorrhage. There is multifocal hyperintense T2-weighted signal within the white matter. Parenchymal volume and CSF spaces are normal. The midline structures are normal. Vascular: Major flow voids are preserved. Skull and upper cervical spine: Normal calvarium  and skull base. Visualized upper cervical spine and soft tissues are normal. Sinuses/Orbits:No paranasal sinus fluid levels or advanced mucosal thickening. No mastoid or middle ear effusion. Normal orbits. IMPRESSION: Multifocal acute/early subacute ischemia within the left MCA territory. No hemorrhage or mass effect. Electronically Signed   By: Ulyses Jarred M.D.   On: 04/14/2022 21:11   ECHOCARDIOGRAM COMPLETE  Result Date: 04/14/2022    ECHOCARDIOGRAM REPORT   Patient Name:   MELBA ARAKI Date of Exam: 04/14/2022 Medical Rec #:  [id number]       Height:       65.0 in Accession #:    [id number]      Weight:       190.9 lb Date of Birth:  [birth year]      BSA:          1.939 m Patient Age:    86 years        BP:           128/71 mmHg Patient Gender: F               HR:           79 bpm. Exam Location:  Inpatient Procedure: 2D Echo, Cardiac Doppler and Color Doppler Indications:    I63.9 Stroke  History:        Patient has prior history of Echocardiogram examinations, most                 recent 05/31/2003. Risk Factors:Hypertension. Acute ischemic                 stroke ([place]).  Sonographer:    Alvino Chapel RCS Referring Phys: [place]  1. Left ventricular ejection fraction, by estimation, is 60 to 65%. The left ventricle has normal function. The left ventricle has no regional wall motion abnormalities. Left ventricular diastolic parameters are consistent with Grade I diastolic dysfunction (impaired relaxation).  2. Right ventricular systolic function is normal. The right ventricular size is normal.  3. Left atrial size was mild to moderately dilated.  4. The mitral valve is normal in structure. Trivial mitral valve regurgitation. No evidence of mitral stenosis.  5. The aortic valve is tricuspid. Aortic valve regurgitation is moderate. Aortic valve sclerosis is present, with no evidence of aortic valve stenosis.  6. Aortic dilatation noted. There is mild dilatation of the ascending  aorta, measuring 40 mm.  7. The inferior vena cava is normal in size with greater than 50% respiratory variability, suggesting right atrial pressure of 3 mmHg. Comparison(s): No prior Echocardiogram. FINDINGS  Left Ventricle: Left ventricular ejection fraction, by estimation, is 60 to 65%. The left ventricle has normal function. The left ventricle has no regional wall motion abnormalities. The left ventricular internal cavity size was normal in size. There is  no left ventricular hypertrophy. Left ventricular diastolic parameters are consistent with Grade I diastolic dysfunction (impaired relaxation). Right Ventricle: The right ventricular size is normal. Right ventricular systolic function is normal. Left Atrium: Left atrial size was mild to moderately dilated. Right Atrium: Right atrial size was normal in size. Pericardium: There is no evidence of pericardial effusion. Mitral Valve: The mitral valve is normal in structure. Trivial mitral valve regurgitation. No evidence of mitral valve stenosis. Tricuspid Valve: The tricuspid valve is normal in structure. Tricuspid valve regurgitation is trivial. No evidence of tricuspid stenosis. Aortic Valve: The aortic valve is tricuspid. Aortic valve regurgitation is moderate. Aortic regurgitation PHT measures 496 msec. Aortic valve sclerosis is present, with no evidence of aortic valve stenosis. Aortic valve mean gradient measures 5.0 mmHg. Aortic valve peak gradient measures 9.6 mmHg. Aortic valve area, by VTI measures 2.66 cm. Pulmonic Valve: The pulmonic valve was normal in structure. Pulmonic valve regurgitation is trivial. No evidence of pulmonic stenosis. Aorta: Aortic dilatation noted. There is mild dilatation of the ascending aorta, measuring 40 mm. Venous: The inferior vena cava is normal in size with greater than 50% respiratory variability, suggesting right atrial pressure of 3 mmHg. IAS/Shunts: The interatrial septum is aneurysmal. No atrial level shunt detected  by color flow Doppler.  LEFT VENTRICLE PLAX 2D LVIDd:         4.50 cm   Diastology LVIDs:         2.70 cm   LV e' medial:    3.33 cm/s LV PW:         1.30 cm   LV E/e' medial:  15.6 LV IVS:        1.40 cm   LV e' lateral:   5.08 cm/s LVOT diam:     2.10 cm   LV E/e' lateral: 10.2 LV SV:         83 LV SV Index:   43 LVOT Area:     3.46  cm  RIGHT VENTRICLE RV S prime:     15.30 cm/s TAPSE (M-mode): 2.5 cm LEFT ATRIUM             Index        RIGHT ATRIUM           Index LA diam:        3.40 cm 1.75 cm/m   RA Area:     19.30 cm LA Vol (A2C):   75.9 ml 39.13 ml/m  RA Volume:   57.90 ml  29.85 ml/m LA Vol (A4C):   81.1 ml 41.82 ml/m LA Biplane Vol: 81.1 ml 41.82 ml/m  AORTIC VALVE AV Area (Vmax):    2.77 cm AV Area (Vmean):   2.89 cm AV Area (VTI):     2.66 cm AV Vmax:           155.00 cm/s AV Vmean:          103.000 cm/s AV VTI:            0.312 m AV Peak Grad:      9.6 mmHg AV Mean Grad:      5.0 mmHg LVOT Vmax:         124.00 cm/s LVOT Vmean:        85.800 cm/s LVOT VTI:          0.240 m LVOT/AV VTI ratio: 0.77 AI PHT:            496 msec  AORTA Ao Root diam: 3.80 cm MITRAL VALVE MV Area (PHT): 3.17 cm    SHUNTS MV Decel Time: 239 msec    Systemic VTI:  0.24 m MV E velocity: 51.80 cm/s  Systemic Diam: 2.10 cm MV A velocity: 92.20 cm/s MV E/A ratio:  0.56 Kirk Ruths MD Electronically signed by Kirk Ruths MD Signature Date/Time: 04/14/2022/1:30:04 PM    Final     PHYSICAL EXAM General:  Alert, well-nourished, well-developed patient in no acute distress Respiratory:  Regular, unlabored respirations on room air  NEURO:  Mental Status: AA&Ox3  Speech/Language: speech is with significant dysarthria.  Naming, repetition, fluency, and comprehension intact.  Cranial Nerves:  II: PERRL. Visual fields full.  III, IV, VI: EOMI. Eyelids elevate symmetrically.  V: Sensation is intact to light touch and symmetrical to face.  VII: Right facial droop present  VIII: hearing intact to voice. IX, X:  Phonation is normal.  XII: tongue is midline without fasciculations. Motor: 5/5 strength to all muscle groups tested.  Tone: is normal and bulk is normal Sensation- Intact to light touch bilaterally.  Coordination: FTN intact bilaterally.No drift.  Gait- deferred     ASSESSMENT/PLAN Ms. MAKALAH ASBERRY is a 86 y.o. female with history of HTN, GERD, anemia, psoriasis and fracture of left humerus 2 years ago presenting with sudden onset dysarthria and right facial droop.  She was given TNK at the outside hospital ED and transferred here for further care.  MRI to be performed later today.  Stroke: Left MCA scattered infarct s/p TNK, embolic pattern, etiology unclear but concerning for occult afib Code Stroke CT head No acute abnormality.  CTA head & neck No LVO or hemodynamically significant stenosis MRI multiple foci of acute ischemia in left MCA territory 2D Echo EF 60 to 65% LE venous Doppler pending We will consider loop recorder to rule out A-fib LDL 101 HgbA1c 5.7 VTE prophylaxis - SCDs No antithrombotic prior to admission, now on aspirin and clopidogrel for three weeks followed by aspirin  alone. Therapy recommendations: Home health PT Disposition:  pending  Hypertension Home meds:  none Stable Keep BP <180/105 Long-term BP goal normotensive  Hyperlipidemia Home meds:  none LDL 101, goal < 70 Add rosuvastatin 20 mg daily Continue statin at discharge  Other Stroke Risk Factors Advanced Age >/= 66  Obesity, Body mass index is 31.77 kg/m., BMI >/= 30 associated with increased stroke risk, recommend weight loss, diet and exercise as appropriate   Other Active Problems Psoriasis Colovaginal fistula Left humeral fracture in 04/2020 Remote right radius and ulna fracture status post rod fixation  Hospital day # Benjamin , MSN, AGACNP-BC [facility] See Amion for schedule and pager information 04/15/2022 10:44 AM  ATTENDING NOTE: I reviewed  above note and agree with the assessment and plan. Pt was seen and examined.   RN at bedside.  No family around.  Patient lying in bed, still has severe dysarthria but seems slightly better than yesterday.  Moving all extremities, still has mild right facial droop.  MRI showed scattered left MCA small infarcts, embolic pattern.  Etiology unclear, concerning for cardioembolic source.  Pending LE venous Doppler to rule out A-fib.  If work-up unrevealing, will consider loop recorder to rule out A-fib as outpatient.  Currently on DAPT for 3 weeks and then aspirin alone, also put on Crestor 20.  PT/OT recommend home health.  For detailed assessment and plan, please refer to above/below as I have made changes wherever appropriate.   Rosalin Hawking, MD PhD Stroke Neurology 04/15/2022 12:32 PM  This patient is critically ill due to left MCA stroke, status post TNK, severe dysarthria and at significant risk of neurological worsening, death form recurrent stroke, hemorrhagic conversion, bleeding from TNK. This patient's care requires constant monitoring of vital signs, hemodynamics, respiratory and cardiac monitoring, review of multiple databases, neurological assessment, discussion with family, other specialists and medical decision making of high complexity. I spent 30 minutes of neurocritical care time in the care of this patient.     To contact Stroke Continuity provider, please refer to [email or web address] After hours, contact General Neurology

## 2022-04-16 ENCOUNTER — Other Ambulatory Visit: Payer: Self-pay | Admitting: Student

## 2022-04-16 ENCOUNTER — Encounter: Payer: Self-pay | Admitting: *Deleted

## 2022-04-16 DIAGNOSIS — I10 Essential (primary) hypertension: Secondary | ICD-10-CM

## 2022-04-16 DIAGNOSIS — I493 Ventricular premature depolarization: Secondary | ICD-10-CM

## 2022-04-16 DIAGNOSIS — E7800 Pure hypercholesterolemia, unspecified: Secondary | ICD-10-CM

## 2022-04-16 DIAGNOSIS — I4891 Unspecified atrial fibrillation: Secondary | ICD-10-CM

## 2022-04-16 DIAGNOSIS — I639 Cerebral infarction, unspecified: Secondary | ICD-10-CM

## 2022-04-16 LAB — BASIC METABOLIC PANEL
Anion gap: 12 (ref 5–15)
BUN: 14 mg/dL (ref 8–23)
CO2: 25 mmol/L (ref 22–32)
Calcium: 9.3 mg/dL (ref 8.9–10.3)
Chloride: 96 mmol/L — ABNORMAL LOW (ref 98–111)
Creatinine, Ser: 0.88 mg/dL (ref 0.44–1.00)
GFR, Estimated: >60 mL/min (ref >60)
Glucose, Bld: 133 mg/dL — ABNORMAL HIGH (ref 70–99)
Potassium: 4 mmol/L (ref 3.5–5.1)
Sodium: 133 mmol/L — ABNORMAL LOW (ref 135–145)

## 2022-04-16 LAB — CBC
HCT: 34.5 % — ABNORMAL LOW (ref 36.0–46.0)
Hemoglobin: 11.7 g/dL — ABNORMAL LOW (ref 12.0–15.0)
MCH: 28.1 pg (ref 26.0–34.0)
MCHC: 33.9 g/dL (ref 30.0–36.0)
MCV: 82.9 fL (ref 80.0–100.0)
Platelets: 212 10*3/uL (ref 150–400)
RBC: 4.16 MIL/uL (ref 3.87–5.11)
RDW: 15.5 % (ref 11.5–15.5)
WBC: 9.6 10*3/uL (ref 4.0–10.5)
nRBC: 0 % (ref 0.0–0.2)

## 2022-04-16 MED ORDER — ASPIRIN 81 MG PO TBEC: 81.0000 mg | DELAYED_RELEASE_TABLET | Freq: Every day | ORAL | 1 refills | Status: AC

## 2022-04-16 MED ORDER — CLOPIDOGREL BISULFATE 75 MG PO TABS: 75.0000 mg | ORAL_TABLET | Freq: Every day | ORAL | 0 refills | Status: DC

## 2022-04-16 MED ORDER — ROSUVASTATIN CALCIUM 20 MG PO TABS
20.0000 mg | ORAL_TABLET | Freq: Every day | ORAL | 1 refills | Status: AC
Start: 2022-04-17 — End: ?

## 2022-04-16 MED ORDER — ADULT MULTIVITAMIN W/MINERALS CH: 1.0000 | ORAL_TABLET | Freq: Every day | ORAL | Status: AC

## 2022-04-16 MED ORDER — FUROSEMIDE 20 MG PO TABS: 20.0000 mg | ORAL_TABLET | Freq: Every day | ORAL | Status: AC

## 2022-04-16 MED ORDER — PANTOPRAZOLE SODIUM 40 MG PO TBEC: 40.0000 mg | DELAYED_RELEASE_TABLET | Freq: Every day | ORAL | 1 refills | Status: AC

## 2022-04-16 MED ORDER — AMLODIPINE BESYLATE 5 MG PO TABS: 5.0000 mg | ORAL_TABLET | Freq: Every day | ORAL | Status: AC

## 2022-04-16 NOTE — Discharge Instructions (Addendum)
You will be contacted for a cardiology appointment regarding possible loop recorder placement. Follow-up with PCP ([place]) for a hospital discharge check at 2 weeks.

## 2022-04-16 NOTE — Discharge Summary (Addendum)
Stroke Discharge Summary  Patient ID: Ellen Johnson   MRN: [id number]      DOB: February 11, 1936  Date of Admission: 04/13/2022 Date of Discharge: 04/16/2022  Attending Physician:  Stroke, Md, MD, Stroke MD Consultant(s):    None  Patient's PCP:  [facility] DIAGNOSIS:  Left MCA scattered infarct s/p TNK, embolic pattern, etiology unclear but concerning for occult afib  Active problem: Hypertension Hyperlipidemia Obesity Psoriasis Colovaginal fistula Left humeral fracture in 04/2020 Remote right radius and ulna fracture status post rod fixation   Allergies as of 04/16/2022       Reactions   Valium [diazepam] Other (See Comments)   Totally different personality        Medication List     STOP taking these medications    omeprazole 40 MG capsule Commonly known as: PRILOSEC   PROBIOTIC DAILY PO   torsemide 10 MG tablet Commonly known as: Demadex       TAKE these medications    amLODipine 5 MG tablet Commonly known as: NORVASC Take 1 tablet (5 mg total) by mouth daily.   ascorbic acid 500 MG tablet Commonly known as: VITAMIN C Take 500 mg by mouth daily. Takes only during winter months   aspirin EC 81 MG tablet Take 1 tablet (81 mg total) by mouth daily. Start taking on: April 17, 2022   clopidogrel 75 MG tablet Commonly known as: PLAVIX Take 1 tablet (75 mg total) by mouth daily. Start taking on: April 17, 2022   cycloSPORINE modified 25 MG capsule Commonly known as: NEORAL Take 25 mg by mouth daily. Patient states it was 1 t capsule twice daily- MD changed this recently - to once daily   ferrous sulfate 324 MG Tbec Take 324 mg by mouth daily with breakfast.   fish oil-omega-3 fatty acids 1000 MG capsule Take 1 g by mouth daily.   furosemide 20 MG tablet Commonly known as: LASIX Take 1 tablet (20 mg total) by mouth daily.   gabapentin 100 MG capsule Commonly known as: NEURONTIN Take 100  mg by mouth daily.   GINSENG PO Take by mouth daily.   hydrOXYzine 25 MG tablet Commonly known as: ATARAX Take 25 mg by mouth at bedtime as needed for itching.   multivitamin with minerals Tabs tablet Take 1 tablet by mouth daily. Start taking on: April 17, 2022   Myrbetriq 25 MG Tb24 tablet Generic drug: mirabegron ER Take 25 mg by mouth daily.   pantoprazole 40 MG tablet Commonly known as: PROTONIX Take 1 tablet (40 mg total) by mouth daily. Start taking on: April 17, 2022   rosuvastatin 20 MG tablet Commonly known as: CRESTOR Take 1 tablet (20 mg total) by mouth daily. Start taking on: April 17, 2022   Vitamin Ellen 12 100 MCG Lozg Take 100 mg by mouth daily.        LABORATORY STUDIES CBC    Component Value Date/Time   WBC 9.6 04/16/2022 0221   RBC 4.16 04/16/2022 0221   HGB 11.7 (L) 04/16/2022 0221   HGB 11.1 05/25/2020 1545   HCT 34.5 (L) 04/16/2022 0221   HCT 34.2 05/25/2020 1545   PLT 212 04/16/2022 0221   PLT 286 03/16/2019 1552   MCV 82.9 04/16/2022 0221   MCV 85 05/25/2020 1545   MCH 28.1 04/16/2022 0221   MCHC 33.9 04/16/2022 0221   RDW 15.5 04/16/2022 0221   RDW 14.5 05/25/2020 1545  LYMPHSABS 1.2 04/13/2022 1745   LYMPHSABS 1.1 05/25/2020 1545   MONOABS 0.6 04/13/2022 1745   EOSABS 0.2 04/13/2022 1745   EOSABS 0.3 05/25/2020 1545   BASOSABS 0.0 04/13/2022 1745   BASOSABS 0.1 05/25/2020 1545   CMP    Component Value Date/Time   NA 133 (L) 04/16/2022 0221   NA 138 05/25/2020 1545   K 4.0 04/16/2022 0221   CL 96 (L) 04/16/2022 0221   CO2 25 04/16/2022 0221   GLUCOSE 133 (H) 04/16/2022 0221   BUN 14 04/16/2022 0221   BUN 15 05/25/2020 1545   CREATININE 0.88 04/16/2022 0221   CREATININE 0.84 12/27/2015 1826   CALCIUM 9.3 04/16/2022 0221   PROT 7.7 04/13/2022 1745   PROT 6.4 05/25/2020 1545   ALBUMIN 4.4 04/13/2022 1745   ALBUMIN 4.2 05/25/2020 1545   AST 16 04/13/2022 1745   ALT 9 04/13/2022 1745   ALKPHOS 81 04/13/2022 1745    BILITOT 0.5 04/13/2022 1745   BILITOT 0.3 05/25/2020 1545   GFRNONAA >60 04/16/2022 0221   GFRNONAA 67 12/27/2015 1826   GFRAA 82 05/25/2020 1545   GFRAA 78 12/27/2015 1826   COAGS Lab Results  Component Value Date   INR 1.0 04/13/2022   Lipid Panel    Component Value Date/Time   CHOL 178 04/15/2022 0216   CHOL 167 05/25/2020 1545   TRIG 122 04/15/2022 0216   HDL 53 04/15/2022 0216   HDL 54 05/25/2020 1545   CHOLHDL 3.4 04/15/2022 0216   VLDL 24 04/15/2022 0216   LDLCALC 101 (H) 04/15/2022 0216   LDLCALC 92 05/25/2020 1545   HgbA1C  Lab Results  Component Value Date   HGBA1C 5.7 (H) 04/14/2022   Urinalysis    Component Value Date/Time   COLORURINE YELLOW 07/19/2016 [place] 07/19/2016 0346   LABSPEC 1.006 07/19/2016 0346   PHURINE 6.0 07/19/2016 0346   GLUCOSEU NEGATIVE 07/19/2016 0346   HGBUR NEGATIVE 07/19/2016 0346   BILIRUBINUR NEGATIVE 07/19/2016 0346   BILIRUBINUR small (A) 12/27/2015 1848   BILIRUBINUR neg 01/25/2015 1833   KETONESUR NEGATIVE 07/19/2016 0346   PROTEINUR NEGATIVE 07/19/2016 0346   UROBILINOGEN 4.0 12/27/2015 1848   NITRITE NEGATIVE 07/19/2016 0346   LEUKOCYTESUR NEGATIVE 07/19/2016 0346   Urine Drug Screen No results found for: "LABOPIA", "COCAINSCRNUR", "LABBENZ", "AMPHETMU", "THCU", "LABBARB"  Alcohol Level    Component Value Date/Time   ETH <10 04/13/2022 1745     SIGNIFICANT DIAGNOSTIC STUDIES VAS [name] LOWER EXTREMITY VENOUS (DVT)  Result Date: 04/15/2022  Lower Venous DVT Study Patient Name:  Ellen Johnson  Date of Exam:   04/15/2022 Medical Rec #: [id number]        Accession #:    [id number] Date of Birth: 1936/02/07       Patient Gender: F Patient Age:   86 years Exam Location:  [facility] Procedure:      VAS [name] LOWER EXTREMITY VENOUS (DVT) Referring Phys: Cornelius Moras Jya Hughston --------------------------------------------------------------------------------  Indications: Stroke.  Comparison Study: Negative left LEV  done 09/11/21 Performing Technologist: Sharion Dove RVS  Examination Guidelines: A complete evaluation includes Ellen-mode imaging, spectral Doppler, color Doppler, and power Doppler as needed of all accessible portions of each vessel. Bilateral testing is considered an integral part of a complete examination. Limited examinations for reoccurring indications may be performed as noted. The reflux portion of the exam is performed with the patient in reverse Trendelenburg.  +---------+---------------+---------+-----------+----------+--------------+ RIGHT    CompressibilityPhasicitySpontaneityPropertiesThrombus Aging +---------+---------------+---------+-----------+----------+--------------+ CFV  Full           Yes      Yes                                 +---------+---------------+---------+-----------+----------+--------------+ SFJ      Full                                                        +---------+---------------+---------+-----------+----------+--------------+ FV Prox  Full                                                        +---------+---------------+---------+-----------+----------+--------------+ FV Mid   Full                                                        +---------+---------------+---------+-----------+----------+--------------+ FV DistalFull                                                        +---------+---------------+---------+-----------+----------+--------------+ PFV      Full                                                        +---------+---------------+---------+-----------+----------+--------------+ POP      Full           Yes      Yes                                 +---------+---------------+---------+-----------+----------+--------------+ PTV      Full                                                        +---------+---------------+---------+-----------+----------+--------------+ PERO     Full                                                         +---------+---------------+---------+-----------+----------+--------------+   +---------+---------------+---------+-----------+----------+--------------+ LEFT     CompressibilityPhasicitySpontaneityPropertiesThrombus Aging +---------+---------------+---------+-----------+----------+--------------+ CFV      Full           Yes      Yes                                 +---------+---------------+---------+-----------+----------+--------------+  SFJ      Full                                                        +---------+---------------+---------+-----------+----------+--------------+ FV Prox  Full                                                        +---------+---------------+---------+-----------+----------+--------------+ FV Mid   Full                                                        +---------+---------------+---------+-----------+----------+--------------+ FV DistalFull                                                        +---------+---------------+---------+-----------+----------+--------------+ PFV      Full                                                        +---------+---------------+---------+-----------+----------+--------------+ POP      Full           Yes      Yes                                 +---------+---------------+---------+-----------+----------+--------------+ PTV      Full                                                        +---------+---------------+---------+-----------+----------+--------------+ PERO     Full                                                        +---------+---------------+---------+-----------+----------+--------------+     Summary: BILATERAL: - No evidence of deep vein thrombosis seen in the lower extremities, bilaterally. -   *See table(s) above for measurements and observations. Electronically signed by Deitra Mayo MD on 04/15/2022 at  5:20:27 PM.    Final    MR BRAIN WO CONTRAST  Result Date: 04/14/2022 CLINICAL DATA:  Stroke follow-up EXAM: MRI HEAD WITHOUT CONTRAST TECHNIQUE: Multiplanar, multiecho pulse sequences of the brain and surrounding structures were obtained without intravenous contrast. COMPARISON:  None Available. FINDINGS: Brain: Multifocal acute/early subacute ischemia within the left MCA territory. No acute or chronic hemorrhage. There is multifocal hyperintense T2-weighted signal within the white matter. Parenchymal  volume and CSF spaces are normal. The midline structures are normal. Vascular: Major flow voids are preserved. Skull and upper cervical spine: Normal calvarium and skull base. Visualized upper cervical spine and soft tissues are normal. Sinuses/Orbits:No paranasal sinus fluid levels or advanced mucosal thickening. No mastoid or middle ear effusion. Normal orbits. IMPRESSION: Multifocal acute/early subacute ischemia within the left MCA territory. No hemorrhage or mass effect. Electronically Signed   By: Ulyses Jarred M.D.   On: 04/14/2022 21:11   ECHOCARDIOGRAM COMPLETE  Result Date: 04/14/2022    ECHOCARDIOGRAM REPORT   Patient Name:   Ellen Johnson Date of Exam: 04/14/2022 Medical Rec #:  [id number]       Height:       65.0 in Accession #:    [id number]      Weight:       190.9 lb Date of Birth:  04/08/1936      BSA:          1.939 m Patient Age:    4 years        BP:           128/71 mmHg Patient Gender: F               HR:           79 bpm. Exam Location:  Inpatient Procedure: 2D Echo, Cardiac Doppler and Color Doppler Indications:    I63.9 Stroke  History:        Patient has prior history of Echocardiogram examinations, most                 recent 05/31/2003. Risk Factors:Hypertension. Acute ischemic                 stroke ([place]).  Sonographer:    Alvino Chapel RCS Referring Phys: [place]  1. Left ventricular ejection fraction, by estimation, is 60 to 65%. The left ventricle has  normal function. The left ventricle has no regional wall motion abnormalities. Left ventricular diastolic parameters are consistent with Grade I diastolic dysfunction (impaired relaxation).  2. Right ventricular systolic function is normal. The right ventricular size is normal.  3. Left atrial size was mild to moderately dilated.  4. The mitral valve is normal in structure. Trivial mitral valve regurgitation. No evidence of mitral stenosis.  5. The aortic valve is tricuspid. Aortic valve regurgitation is moderate. Aortic valve sclerosis is present, with no evidence of aortic valve stenosis.  6. Aortic dilatation noted. There is mild dilatation of the ascending aorta, measuring 40 mm.  7. The inferior vena cava is normal in size with greater than 50% respiratory variability, suggesting right atrial pressure of 3 mmHg. Comparison(s): No prior Echocardiogram. FINDINGS  Left Ventricle: Left ventricular ejection fraction, by estimation, is 60 to 65%. The left ventricle has normal function. The left ventricle has no regional wall motion abnormalities. The left ventricular internal cavity size was normal in size. There is  no left ventricular hypertrophy. Left ventricular diastolic parameters are consistent with Grade I diastolic dysfunction (impaired relaxation). Right Ventricle: The right ventricular size is normal. Right ventricular systolic function is normal. Left Atrium: Left atrial size was mild to moderately dilated. Right Atrium: Right atrial size was normal in size. Pericardium: There is no evidence of pericardial effusion. Mitral Valve: The mitral valve is normal in structure. Trivial mitral valve regurgitation. No evidence of mitral valve stenosis. Tricuspid Valve: The tricuspid valve is normal in structure. Tricuspid valve regurgitation is trivial.  No evidence of tricuspid stenosis. Aortic Valve: The aortic valve is tricuspid. Aortic valve regurgitation is moderate. Aortic regurgitation PHT measures 496 msec.  Aortic valve sclerosis is present, with no evidence of aortic valve stenosis. Aortic valve mean gradient measures 5.0 mmHg. Aortic valve peak gradient measures 9.6 mmHg. Aortic valve area, by VTI measures 2.66 cm. Pulmonic Valve: The pulmonic valve was normal in structure. Pulmonic valve regurgitation is trivial. No evidence of pulmonic stenosis. Aorta: Aortic dilatation noted. There is mild dilatation of the ascending aorta, measuring 40 mm. Venous: The inferior vena cava is normal in size with greater than 50% respiratory variability, suggesting right atrial pressure of 3 mmHg. IAS/Shunts: The interatrial septum is aneurysmal. No atrial level shunt detected by color flow Doppler.  LEFT VENTRICLE PLAX 2D LVIDd:         4.50 cm   Diastology LVIDs:         2.70 cm   LV e' medial:    3.33 cm/s LV PW:         1.30 cm   LV E/e' medial:  15.6 LV IVS:        1.40 cm   LV e' lateral:   5.08 cm/s LVOT diam:     2.10 cm   LV E/e' lateral: 10.2 LV SV:         83 LV SV Index:   43 LVOT Area:     3.46 cm  RIGHT VENTRICLE RV S prime:     15.30 cm/s TAPSE (M-mode): 2.5 cm LEFT ATRIUM             Index        RIGHT ATRIUM           Index LA diam:        3.40 cm 1.75 cm/m   RA Area:     19.30 cm LA Vol (A2C):   75.9 ml 39.13 ml/m  RA Volume:   57.90 ml  29.85 ml/m LA Vol (A4C):   81.1 ml 41.82 ml/m LA Biplane Vol: 81.1 ml 41.82 ml/m  AORTIC VALVE AV Area (Vmax):    2.77 cm AV Area (Vmean):   2.89 cm AV Area (VTI):     2.66 cm AV Vmax:           155.00 cm/s AV Vmean:          103.000 cm/s AV VTI:            0.312 m AV Peak Grad:      9.6 mmHg AV Mean Grad:      5.0 mmHg LVOT Vmax:         124.00 cm/s LVOT Vmean:        85.800 cm/s LVOT VTI:          0.240 m LVOT/AV VTI ratio: 0.77 AI PHT:            496 msec  AORTA Ao Root diam: 3.80 cm MITRAL VALVE MV Area (PHT): 3.17 cm    SHUNTS MV Decel Time: 239 msec    Systemic VTI:  0.24 m MV E velocity: 51.80 cm/s  Systemic Diam: 2.10 cm MV A velocity: 92.20 cm/s MV E/A ratio:   0.56 Kirk Ruths MD Electronically signed by Kirk Ruths MD Signature Date/Time: 04/14/2022/1:30:04 PM    Final    CT ANGIO HEAD NECK W WO CM (CODE STROKE)  Result Date: 04/13/2022 CLINICAL DATA:  Stroke code, stroke suspected EXAM: CT ANGIOGRAPHY HEAD AND NECK TECHNIQUE: Multidetector  CT imaging of the head and neck was performed using the standard protocol during bolus administration of intravenous contrast. Multiplanar CT image reconstructions and MIPs were obtained to evaluate the vascular anatomy. Carotid stenosis measurements (when applicable) are obtained utilizing NASCET criteria, using the distal internal carotid diameter as the denominator. RADIATION DOSE REDUCTION: This exam was performed according to the departmental dose-optimization program which includes automated exposure control, adjustment of the mA and/or kV according to patient size and/or use of iterative reconstruction technique. CONTRAST:  49m OMNIPAQUE IOHEXOL 350 MG/ML SOLN COMPARISON:  05/08/2021 CT head, no prior CTA FINDINGS: CT HEAD FINDINGS Brain: No evidence of acute infarct, hemorrhage, mass, mass effect, or midline shift. No hydrocephalus or extra-axial fluid collection. Vascular: No hyperdense vessel. Skull: Normal. Negative for fracture or focal lesion. Sinuses/Orbits: No acute finding. Other: The mastoid air cells are well aerated. CTA NECK FINDINGS Aortic arch: Standard branching. Imaged portion shows no evidence of aneurysm or dissection. No significant stenosis of the major arch vessel origins. Aortic atherosclerosis. Right carotid system: No evidence of dissection, occlusion, or hemodynamically significant stenosis (greater than 50%). Left carotid system: No evidence of dissection, occlusion, or hemodynamically significant stenosis (greater than 50%). Vertebral arteries: Left dominant. No evidence of dissection, occlusion, or hemodynamically significant stenosis (greater than 50%). Skeleton: No acute osseous  abnormality. Degenerative changes in the cervical spine. Poor dentition with dental caries and periapical lucencies. Other neck: Multifocal subcentimeter lesions in the thyroid, for which no follow-up is indicated. (Reference: J Am Coll Radiol. 2015 Feb;12(2): 143-50) Upper chest: Air trapping.  No pleural effusion. Review of the MIP images confirms the above findings CTA HEAD FINDINGS Anterior circulation: Both internal carotid arteries are patent to the termini, without significant stenosis. A1 segments patent. Normal anterior communicating artery. Anterior cerebral arteries are patent to their distal aspects. No M1 stenosis or occlusion. MCA branches perfused and symmetric. Posterior circulation: Vertebral arteries patent to the vertebrobasilar junction without stenosis. Basilar patent to its distal aspect. Superior cerebellar arteries patent proximally. Patent P1 segments. PCAs perfused to their distal aspects without stenosis. The right posterior communicating arteries is patent. Venous sinuses: As permitted by contrast timing, patent. Anatomic variants: None significant. Review of the MIP images confirms the above findings IMPRESSION: 1. No acute intracranial process. 2. No intracranial large vessel occlusion or significant stenosis. 3. No hemodynamically significant stenosis in the neck. Code stroke imaging results were communicated on 04/13/2022 at 6:10 pm to provider PSharlett Ilesvia telephone, who verbally acknowledged these results. Electronically Signed   By: AMerilyn BabaM.D.   On: 04/13/2022 18:16      HISTORY OF PRESENT ILLNESS BCORAH WILLEFORDis a 86y.o. female with PMH significant for HTN, GERD, anemia, psoriasis, recent fx of L humerus who presents with sudden onset difficulty speaking and a mild R facial droop while she was dressing at 1500 on 04/13/22.   No prior hx of similar symptoms. Stroke [facility] was seen by teleneurology and given tnkase. CT  Angio with no LVO. [facility] and post tnkase checks.   LKW: 1500 on 04/13/22. mRS: 1 [facility]. Thrombectomy: not offered 2/2 no LVO  HOSPITAL COURSE Ms. Ellen KWOLEKis a 86y.o. female with history of HTN, GERD, anemia, psoriasis and fracture of left humerus 2 years ago presenting with sudden onset dysarthria and right facial droop.  She was given  TNK at the outside hospital ED and transferred here for further care.    Stroke: Left MCA scattered infarct s/p TNK, embolic pattern, etiology unclear but concerning for occult afib Code Stroke CT head No acute abnormality.  CTA head & neck No LVO or hemodynamically significant stenosis MRI multiple foci of acute ischemia in left MCA territory 2D Echo EF 60 to 65% LE venous Doppler no DVT Discussed with cardiology EP, will place loop recorder outpatient rule out A-fib due to bedbugs  LDL 101 HgbA1c 5.7 VTE prophylaxis - SCDs No antithrombotic prior to admission, now on aspirin and clopidogrel for three weeks followed by aspirin alone. Therapy recommendations: Home health PT Disposition: Home   Hypertension Home meds:  none Stable Keep BP <180/105 Long-term BP goal normotensive   Hyperlipidemia Home meds:  none LDL 101, goal < 70 Add rosuvastatin 20 mg daily Continue statin at discharge   Other Stroke Risk Factors Advanced Age >/= 61  Obesity, Body mass index is 31.77 kg/m., BMI >/= 30 associated with increased stroke risk, recommend weight loss, diet and exercise as appropriate    Other Active Problems Psoriasis Colovaginal fistula Left humeral fracture in 04/2020 Remote right radius and ulna fracture status post rod fixation Bedbug   DISCHARGE EXAM Blood pressure 92/80, pulse 76, temperature (!) 97.4 F (36.3 C), temperature source Oral, resp. rate 15, height '5\' 5"'$  (1.651 m), weight 86.6 kg, SpO2 97 %.  Physical Exam  Constitutional: Appears  well-developed and well-nourished.   Cardiovascular: Normal rate and regular rhythm.  Respiratory: Effort normal, non-labored breathing  Neuro: Mental Status: Patient is awake, alert, oriented to person, place, month, year, and situation. Speech is with significant dysarthria. Naming, repetition, fluency, and comprehension intact.  Cranial Nerves: III,IV, VI: EOMI without ptosis or diploplia.  VII: Mild R facial droop.  VIII: Hearing is intact to voice XII: tongue is midline without fasciculations.  Motor: 5/5 strength present in all four extremities.  Coordination:  FTN intact bilaterally.No drift.   Discharge Diet       Diet   Diet regular Room service appropriate? Yes; Fluid consistency: Thin   liquids  DISCHARGE PLAN Disposition:  Home aspirin 81 mg daily and clopidogrel 75 mg daily for secondary stroke prevention for 3 weeks then aspirin alone. Continue crestor '20mg'$  daily  Ongoing stroke risk factor control by Primary Care Physician at time of discharge Follow-up PCP [place] in 2 weeks. Follow-up in [facility] in 4 weeks, office to schedule an appointment.   35 minutes were spent preparing discharge.  Rolanda Lundborg, MD, PGY-1  ATTENDING NOTE: I reviewed above note and agree with the assessment and plan. Pt was seen and examined.   RN at bedside.  Patient awake alert, orientated x 3, talkative, however still has significant dysarthria.  Right facial droop much improved.  Moving all extremities.  LE venous Doppler no DVT.  Discussed with cardiology EP, due to bedbug situation, recommend outpatient loop recorder placement.  PT therapy recommend home health. SW discussed with patient, she will have 24/7 supervision initially at home.  Patient will be discharged in stabilization today.  Continue DAPT for 3 weeks and then aspirin alone, continue statin.  Follow[facility]up at [facility] in 4 weeks.  For  detailed assessment and plan, please refer to above/below as I have made changes wherever appropriate.   Rosalin Hawking, MD PhD Stroke Neurology 04/16/2022 3:58 PM

## 2022-04-16 NOTE — Progress Notes (Signed)
Monitor for Cryptogenic Stroke / PVCs / Possible AF.

## 2022-04-16 NOTE — TOC Transition Note (Signed)
Transition of Care [facility]) - CM/SW Discharge Note   Patient Details  Name: Ellen Johnson MRN: [id number] Date of Birth: 1935/11/07  [facility] CM/SW Contact:  Ella Bodo, RN Phone Number: 04/16/2022, 12:12 PM   Clinical Narrative:    86 y.o. female who presents 8/4 with sudden onset difficulty speaking and a mild R facial droop.  Pt received TNK.  CT angio with no LVO; No acute intracranial process.  PTA, pt independent and living at home with son, who can provide intermittent assistance at dc. Spoke with patient's daughter, Caryn Section, and she confirms that patient will have needed assistance at home provided by family.  PT/OT and ST recommending [place] follow up, and patient agreeable to services.  Referral to [facility] for continued home therapies. No DME recommended.  Patient states PCP is [employer], DO.      Final next level of care: [place] Barriers to Discharge: Barriers Resolved   Patient Goals and CMS Choice Patient states their goals for this hospitalization and ongoing recovery are:: to go home CMS Medicare.gov Compare Post Acute Care list provided to:: Patient Choice offered to / list presented to : Patient                        Discharge Plan and Services   Discharge Planning Services: CM Consult Post Acute Care Choice: Home Health                    HH Arranged: PT, OT, Speech Therapy HH Agency: [place] Date [place]: 04/16/22 Time HH Agency Contacted: 80 Representative spoke with at [place]: Otila Kluver  Social Determinants of Health (SDOH) Interventions     Readmission Risk Interventions     No data to display          Reinaldo Raddle, RN, BSN  Trauma/Neuro ICU Case Manager [phone]

## 2022-04-16 NOTE — Progress Notes (Signed)
Speech Language Pathology Treatment: Cognitive-Linquistic  Patient Details Name: Ellen Johnson MRN: [id number] DOB: 1935/12/02 Today's Date: 04/16/2022 Time: 7915-0569 SLP Time Calculation (min) (ACUTE ONLY): 27 min  Assessment / Plan / Recommendation Clinical Impression  Pt was on the phone initially upon SLP arrival, telling the person on the receiving end that her speech seems to be improved, although note that she was asked several times to repeat herself. SLP provided education about strategies to improve her clarity of speech and gave opportunities in which to practice. She has increased trouble with carry over though, needing frequent reminders during conversation to slow her pace and over-articulate, which did improve her intelligibility. All questions were answered at this time. Pt endorses having help at home from family. Continue to recommend [facility] SLP.    HPI HPI: 86 y.o. female who presents 8/4 with sudden onset difficulty speaking and a mild R facial droop.  Pt received TNK.  CT angio with no LVO; No acute intracranial process.  PMH includes:  Anemia, diverticulosis, GERD, HTN, recent Lt humerus fx, remote Rt UE fx, psoriasis. Pt to have MRI of brain completed later this date per chart review.      SLP Plan  Continue with current plan of care      Recommendations for follow up therapy are one component of a multi-disciplinary discharge planning process, led by the attending physician.  Recommendations may be updated based on patient status, additional functional criteria and insurance authorization.    Recommendations                   Follow Up Recommendations: Home health SLP Assistance recommended at discharge: Intermittent Supervision/Assistance SLP Visit Diagnosis: Dysarthria and anarthria (R47.1);Aphasia (R47.01) Plan: Continue with current plan of care           Osie Bond., M.A. [place] Office [phone]  Secure chat  preferred   04/16/2022, 2:05 PM

## 2022-04-16 NOTE — Progress Notes (Addendum)
Physical Therapy Treatment Patient Details Name: Ellen Johnson MRN: [id number] DOB: 05/14/1936 Today's Date: 04/16/2022   History of Present Illness 86 y.o. female who presents 8/4 with sudden onset difficulty speaking and a mild R facial droop.  Pt received TNK.  CT angio with no LVO;Brain MRI-left MCA scattered infarcts.  PMH includes:  Anemia, diverticulosis, GERD, HTN, recent Lt humerus fx, remote Rt UE fx, psoriasis    PT Comments    Patient progressing well towards PT goals. Session focused on progressive ambulation and functional tasks. Requires Min guard assist for transfers and ambulation with [facility] for support. Continues to have difficulty with dual tasking needing to stop and talk during session. Continues to have dysarthria making cognitive assessment challenging. Reports not feeling at baseline with regards to strength and mobility. Continue to recommend HHPT due to above .Will follow.   Recommendations for follow up therapy are one component of a multi-disciplinary discharge planning process, led by the attending physician.  Recommendations may be updated based on patient status, additional functional criteria and insurance authorization.  Follow Up Recommendations  Home health PT     Assistance Recommended at Discharge Intermittent Supervision/Assistance  Patient can return home with the following A little help with bathing/dressing/bathroom;A little help with walking and/or transfers;Assistance with cooking/housework;Direct supervision/assist for medications management;Direct supervision/assist for financial management;Assist for transportation   Equipment Recommendations  None recommended by PT    Recommendations for Other Services       Precautions / Restrictions Precautions Precautions: Fall Restrictions Weight Bearing Restrictions: No     Mobility  Bed Mobility Overal bed mobility: Needs Assistance Bed Mobility: Supine to Sit     Supine to sit: Supervision,  HOB elevated     General bed mobility comments: Supervision for safety, extra time and use of bed functions to sit up EOB.    Transfers Overall transfer level: Needs assistance Equipment used: Straight cane Transfers: Sit to/from Stand Sit to Stand: Min guard           General transfer comment: Min guard for safety. Stood from [employer], from toilet x1, transferred to chair post ambulation. Pt using momentum to rise.    Ambulation/Gait Ambulation/Gait assistance: Min guard Gait Distance (Feet): 200 Feet Assistive device: Straight cane Gait Pattern/deviations: Step-through pattern, Decreased stride length, Wide base of support Gait velocity: reduced Gait velocity interpretation: <1.31 ft/sec, indicative of household ambulator   General Gait Details: Pt with slow, but fairly steady gait with wide BOS and lateral trunk flexion each step. No LOB, min guard for safety. Pt stopping to talk often as she had difficulty dual tasking.   Stairs             Wheelchair Mobility    Modified Rankin (Stroke Patients Only) Modified Rankin (Stroke Patients Only) Pre-Morbid Rankin Score: Slight disability Modified Rankin: Slight disability     Balance Overall balance assessment: Needs assistance Sitting-balance support: Feet supported, No upper extremity supported Sitting balance-Leahy Scale: Good Sitting balance - Comments: Able to perform pericare sitting on toilet   Standing balance support: During functional activity Standing balance-Leahy Scale: Poor Standing balance comment: Reliant on SPC                            Cognition Arousal/Alertness: Awake/alert Behavior During Therapy: WFL for tasks assessed/performed Overall Cognitive Status: Difficult to assess Area of Impairment: Attention  Current Attention Level: Alternating           General Comments: Pt unable to dual task, having to stop ambulating to talk         Exercises      General Comments General comments (skin integrity, edema, etc.): VSS on RA      Pertinent Vitals/Pain Pain Assessment Faces Pain Scale: Hurts little more Pain Location: L shoulder (chronic) Pain Descriptors / Indicators: Discomfort, Guarding, Grimacing Pain Intervention(s): Monitored during session    Home Living                          Prior Function            PT Goals (current goals can now be found in the care plan section) Progress towards PT goals: Progressing toward goals    Frequency    Min 4X/week      PT Plan Current plan remains appropriate    Co-evaluation              AM-PAC PT "6 Clicks" Mobility   Outcome Measure  Help needed turning from your back to your side while in a flat bed without using bedrails?: A Little Help needed moving from lying on your back to sitting on the side of a flat bed without using bedrails?: A Little Help needed moving to and from a bed to a chair (including a wheelchair)?: A Little Help needed standing up from a chair using your arms (e.g., wheelchair or bedside chair)?: A Little Help needed to walk in hospital room?: A Little Help needed climbing 3-5 steps with a railing? : A Little 6 Click Score: 18    End of Session Equipment Utilized During Treatment: Gait belt Activity Tolerance: Patient tolerated treatment well Patient left: in chair;with call bell/phone within reach;with nursing/sitter in room Nurse Communication: Mobility status PT Visit Diagnosis: Unsteadiness on feet (R26.81);Other abnormalities of gait and mobility (R26.89);Other symptoms and signs involving the nervous system (R29.898)     Time: 7371-0626 PT Time Calculation (min) (ACUTE ONLY): 37 min  Charges:  $Gait Training: 8-22 mins $Therapeutic Activity: 8-22 mins                     Marisa Severin, PT, DPT Acute Rehabilitation Services Secure chat preferred Office [place] 04/16/2022, 1:35 PM

## 2022-04-17 ENCOUNTER — Telehealth: Payer: Self-pay | Admitting: Internal Medicine

## 2022-04-17 NOTE — Telephone Encounter (Signed)
Returned call to daughter Ellen Johnson (on [place]), and shared message from Dr. Tanna Furry nurse:  Ellen Johnson told me they were going to discharge her with a monitor.   Then based on monitor results she would be scheduled for loop implant.    Ellen Johnson verbalized understanding and expressed appreciation.

## 2022-04-17 NOTE — Telephone Encounter (Signed)
Calling to see if patient needing a loop recorder is an emergency or waiting till October is okay. Please advise

## 2022-05-01 ENCOUNTER — Telehealth: Payer: Self-pay

## 2022-05-01 NOTE — Telephone Encounter (Signed)
-----   Message from Shirley Friar, PA-C sent at 05/01/2022  8:29 AM EDT ----- Regarding: RE: Thank you!   Jakaylah Schlafer, can you please call the patient to discuss and ask her if she still has bed bugs? ----- Message ----- From: Jennefer Bravo Sent: 05/01/2022   8:18 AM EDT To: Shirley Friar, PA-C  Good morning,   Her monitor was delivered at her front door 04/19/22 at 9:47 am.  There is no baseline recording on that device , so she has not applied the monitor yet.  ----- Message ----- From: Shirley Friar, PA-C Sent: 04/30/2022   9:14 PM EDT To: Jennefer Bravo  Good morning!  Are you able to confirm whether this pt's event monitor was ordered/received?      Currently it only says "ordered" and not in progress

## 2022-05-01 NOTE — Telephone Encounter (Signed)
Called pt - no answer. Called Daughter and she stated she was at work and would have to call back.

## 2022-05-03 NOTE — Telephone Encounter (Signed)
I spoke to the pt, she is having to stay in a motel while they try to get the bed bugs under control in her apartment. She does have the monitor but her friend that is going to help her put it on can not do it until she moves back home. She is hoping to be able to go home next week. She has no transportation to come to the office to have the monitor put on here. She ask me to call her back next week and she would update me on her situation.

## 2022-05-16 ENCOUNTER — Ambulatory Visit: Payer: Medicare HMO | Admitting: Adult Health

## 2022-05-16 ENCOUNTER — Encounter: Payer: Self-pay | Admitting: Adult Health

## 2022-05-16 VITALS — BP 145/73 | HR 78 | Ht 64.0 in | Wt 191.0 lb

## 2022-05-16 DIAGNOSIS — I69322 Dysarthria following cerebral infarction: Secondary | ICD-10-CM | POA: Diagnosis not present

## 2022-05-16 DIAGNOSIS — Z09 Encounter for follow-up examination after completed treatment for conditions other than malignant neoplasm: Secondary | ICD-10-CM | POA: Diagnosis not present

## 2022-05-16 DIAGNOSIS — I639 Cerebral infarction, unspecified: Secondary | ICD-10-CM

## 2022-05-16 NOTE — Patient Instructions (Addendum)
Continue working with speech therapy for hopeful ongoing recovery   Follow up with cardiology as scheduled next month   Continue aspirin 81 mg daily  and Crestor '20mg'$  daily  for secondary stroke prevention  Please ensure you are no longer taking Plavix (clopidogrel) as you completed 3 weeks duration already  Continue to follow up with PCP regarding cholesterol and blood pressure management  Maintain strict control of hypertension with blood pressure goal below 130/90 and cholesterol with LDL cholesterol (bad cholesterol) goal below 70 mg/dL.   Signs of a Stroke? Follow the BEFAST method:  Balance Watch for a sudden loss of balance, trouble with coordination or vertigo Eyes Is there a sudden loss of vision in one or both eyes? Or double vision?  Face: Ask the person to smile. Does one side of the face droop or is it numb?  Arms: Ask the person to raise both arms. Does one arm drift downward? Is there weakness or numbness of a leg? Speech: Ask the person to repeat a simple phrase. Does the speech sound slurred/strange? Is the person confused ? Time: If you observe any of these signs, call 911.     Followup in the future with me in 4 months or call earlier if needed       Thank you for coming to see [name] at [facility]. I hope we have been able to provide you high quality care today.  You may receive a patient satisfaction survey over the next few weeks. We would appreciate your feedback and comments so that we may continue to improve ourselves and the health of our patients.     Stroke Prevention Some medical conditions and lifestyle choices can lead to a higher risk for a stroke. You can help to prevent a stroke by eating healthy foods and exercising. It also helps to not smoke and to manage any health problems you may have. How can this condition affect me? A stroke is an emergency. It should be treated right away. A stroke can lead to brain damage or threaten  your life. There is a better chance of surviving and getting better after a stroke if you get medical help right away. What can increase my risk? The following medical conditions may increase your risk of a stroke: Diseases of the heart and blood vessels (cardiovascular disease). High blood pressure (hypertension). Diabetes. High cholesterol. Sickle cell disease. Problems with blood clotting. Being very overweight. Sleeping problems (obstructivesleep apnea). Other risk factors include: Being older than age 75. A history of blood clots, stroke, or mini-stroke (TIA). Race, ethnic background, or a family history of stroke. Smoking or using tobacco products. Taking birth control pills, especially if you smoke. Heavy alcohol and drug use. Not being active. What actions can I take to prevent this? Manage your health conditions High cholesterol. Eat a healthy diet. If this is not enough to manage your cholesterol, you may need to take medicines. Take medicines as told by your doctor. High blood pressure. Try to keep your blood pressure below 130/80. If your blood pressure cannot be managed through a healthy diet and regular exercise, you may need to take medicines. Take medicines as told by your doctor. Ask your doctor if you should check your blood pressure at home. Have your blood pressure checked every year. Diabetes. Eat a healthy diet and get regular exercise. If your blood sugar (glucose) cannot be managed through diet and exercise, you may need to take medicines. Take medicines as told  by your doctor. Talk to your doctor about getting checked for sleeping problems. Signs of a problem can include: Snoring a lot. Feeling very tired. Make sure that you manage any other conditions you have. Nutrition  Follow instructions from your doctor about what to eat or drink. You may be told to: Eat and drink fewer calories each day. Limit how much salt (sodium) you use to 1,500 milligrams  (mg) each day. Use only healthy fats for cooking, such as olive oil, canola oil, and sunflower oil. Eat healthy foods. To do this: Choose foods that are high in fiber. These include whole grains, and fresh fruits and vegetables. Eat at least 5 servings of fruits and vegetables a day. Try to fill one-half of your plate with fruits and vegetables at each meal. Choose low-fat (lean) proteins. These include low-fat cuts of meat, chicken without skin, fish, tofu, beans, and nuts. Eat low-fat dairy products. Avoid foods that: Are high in salt. Have saturated fat. Have trans fat. Have cholesterol. Are processed or pre-made. Count how many carbohydrates you eat and drink each day. Lifestyle If you drink alcohol: Limit how much you have to: 0-1 drink a day for women who are not pregnant. 0-2 drinks a day for men. Know how much alcohol is in your drink. In the [place] one drink equals one 12 oz bottle of beer (319m), one 5 oz glass of wine (1426m, or one 1 oz glass of hard liquor (4436m Do not smoke or use any products that have nicotine or tobacco. If you need help quitting, ask your doctor. Avoid secondhand smoke. Do not use drugs. Activity  Try to stay at a healthy weight. Get at least 30 minutes of exercise on most days, such as: Fast walking. Biking. Swimming. Medicines Take over-the-counter and prescription medicines only as told by your doctor. Avoid taking birth control pills. Talk to your doctor about the risks of taking birth control pills if: You are over 35 20ars old. You smoke. You get very bad headaches. You have had a blood clot. Where to find more information American Stroke Association: www.strokeassociation.org Get help right away if: You or a loved one has any signs of a stroke. "BE FAST" is an easy way to remember the warning signs: B - Balance. Dizziness, sudden trouble walking, or loss of balance. E - Eyes. Trouble seeing or a change in how you see. F - Face.  Sudden weakness or loss of feeling of the face. The face or eyelid may droop on one side. A - Arms. Weakness or loss of feeling in an arm. This happens all of a sudden and most often on one side of the body. S - Speech. Sudden trouble speaking, slurred speech, or trouble understanding what people say. T - Time. Time to call emergency services. Write down what time symptoms started. You or a loved one has other signs of a stroke, such as: A sudden, very bad headache with no known cause. Feeling like you may vomit (nausea). Vomiting. A seizure. These symptoms may be an emergency. Get help right away. Call your local emergency services (911 in the [place] Do not wait to see if the symptoms will go away. Do not drive yourself to the hospital. Summary You can help to prevent a stroke by eating healthy, exercising, and not smoking. It also helps to manage any health problems you have. Do not smoke or use any products that contain nicotine or tobacco. Get help right away if you or a  loved one has any signs of a stroke. This information is not intended to replace advice given to you by your health care provider. Make sure you discuss any questions you have with your health care provider. Document Revised: 03/28/2020 Document Reviewed: 03/28/2020 Elsevier Patient Education  [employer].

## 2022-05-16 NOTE — Progress Notes (Signed)
[facility] [address] [place]. [place] [address] [phone]       HOSPITAL FOLLOW UP NOTE  [name] Date of Birth:  [birth year] Medical Record Number:  [id number]    Primary neurologist: Dr. Leonie Man Reason for Referral:  hospital stroke follow up    SUBJECTIVE:   CHIEF COMPLAINT:  Chief Complaint  Patient presents with   Follow-up    Rm 3 with pastor Arvil  Pt is well, having some speech impairment. no other concerns     HPI:   [name] is a 86 y.o. female with history of HTN, GERD, anemia, psoriasis and fracture of left humerus 2 years ago who presented on 04/13/2022 with sudden onset dysarthria and right facial droop.  She was given TNK at the outside hospital ED and transferred to [facility] for further care.  Stroke work-up revealed left MCA cerebral infarcts embolic pattern secondary to unclear source of concern for occult A-fib.  CTA head/neck unremarkable.  EF 60 to 65%.  LE Doppler negative for DVT.  Recommended placement of ILR to rule out atrial fibrillation.  LDL 101.  A1c 5.7.  Recommended DAPT for 3 weeks and aspirin alone as well as Crestor 20 mg daily.  PT/OT/SLP recommended home health therapy.   Today, 05/16/2022, patient is being seen for initial hospital follow-up accompanied by her pastor.  She has been doing well since discharge without new stroke/TIA symptoms.  Reports residual dysarthria, has been working with SLP noting gradual improvement.  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  Denies any other residual stroke deficits.  Completed 3 weeks DAPT, remains on aspirin alone as well as Crestor, unsure if she is still on Plavix, denies side effects Blood pressure today 145/73, is working on getting her own BP cuff at home to routinely monitor Recently received cardiac monitor in the mail, has not yet started, is scheduled to see cardiology Dr.  Lovena Le next month  No further concerns at this time    PERTINENT IMAGING  Per hospitalization 04/13/2022 Code Stroke CT head No acute abnormality.  CTA head & neck No LVO or hemodynamically significant stenosis MRI multiple foci of acute ischemia in left MCA territory 2D Echo EF 60 to 65% LE venous Doppler negative for DVT  LDL 101 HgbA1c 5.7    ROS:   14 system review of systems performed and negative with exception of those listed in HPI  PMH:  Past Medical History:  Diagnosis Date   Anemia    Diverticulosis    GERD (gastroesophageal reflux disease)    HTN (hypertension)    Hx of colonic polyps     PSH:  Past Surgical History:  Procedure Laterality Date   ABDOMINAL HYSTERECTOMY     TAH BSO   APPENDECTOMY     CHOLECYSTECTOMY     FRACTURE SURGERY      Social History:  Social History   Socioeconomic History   Marital status: Widowed    Spouse name: Not on file   Number of children: 7   Years of education: Not on file   Highest education level: Associate degree: academic program  Occupational History   Occupation: RN  Tobacco Use   Smoking status: Never    Passive exposure: Past   Smokeless tobacco: Never  Vaping Use   Vaping Use: Never used  Substance and Sexual Activity   Alcohol use: Yes    Comment:  4 oz weekly of wine   Drug use: No   Sexual activity: Never  Other Topics Concern   Not on file  Social History Narrative   Not on file   Social Determinants of Health   Financial Resource Strain: Low Risk  (08/14/2017)   Overall Financial Resource Strain (CARDIA)    Difficulty of Paying Living Expenses: Not hard at all  Food Insecurity: No Food Insecurity (08/14/2017)   Hunger Vital Sign    Worried About Running Out of Food in the Last Year: Never true    Ran Out of Food in the Last Year: Never true  Transportation Needs: No Transportation Needs (08/14/2017)   PRAPARE - Hydrologist (Medical): No    Lack of  Transportation (Non-Medical): No  Physical Activity: Sufficiently Active (08/14/2017)   Exercise Vital Sign    Days of Exercise per Week: 2 days    Minutes of Exercise per Session: 120 min  Stress: Stress Concern Present (08/14/2017)   [place]    Feeling of Stress : To some extent  Social Connections: Moderately Integrated (08/14/2017)   Social Connection and Isolation Panel [NHANES]    Frequency of Communication with Friends and Family: More than three times a week    Frequency of Social Gatherings with Friends and Family: More than three times a week    Attends Religious Services: More than 4 times per year    Active Member of [employer] or Organizations: Yes    Attends Archivist Meetings: More than 4 times per year    Marital Status: Widowed  Intimate Partner Violence: Not At Risk (08/14/2017)   Humiliation, Afraid, Rape, and Kick questionnaire    Fear of Current or Ex-Partner: No    Emotionally Abused: No    Physically Abused: No    Sexually Abused: No    Family History:  Family History  Problem Relation Age of Onset   Stroke Mother    Colon cancer Father    Stroke Father    Stroke Maternal Grandmother    Breast cancer Paternal Grandmother     Medications:   Current Outpatient Medications on File Prior to Visit  Medication Sig Dispense Refill   amLODipine (NORVASC) 5 MG tablet Take 1 tablet (5 mg total) by mouth daily.     aspirin EC 81 MG tablet Take 1 tablet (81 mg total) by mouth daily. 30 tablet 1   Cyanocobalamin (VITAMIN B 12) 100 MCG LOZG Take 100 mg by mouth daily.     cycloSPORINE modified (NEORAL) 25 MG capsule Take 25 mg by mouth daily. Patient states it was 1 t capsule twice daily- MD changed this recently - to once daily     ferrous sulfate 324 MG TBEC Take 324 mg by mouth daily with breakfast.     fish oil-omega-3 fatty acids 1000 MG capsule Take 1 g by mouth daily.      furosemide  (LASIX) 20 MG tablet Take 1 tablet (20 mg total) by mouth daily.     gabapentin (NEURONTIN) 100 MG capsule Take 100 mg by mouth daily.     GINSENG PO Take by mouth daily.     hydrOXYzine (ATARAX) 25 MG tablet Take 25 mg by mouth at bedtime as needed for itching.     mirabegron ER (MYRBETRIQ) 25 MG TB24 tablet Take 25 mg by mouth daily.     Multiple Vitamin (MULTIVITAMIN WITH MINERALS) TABS tablet  Take 1 tablet by mouth daily.     pantoprazole (PROTONIX) 40 MG tablet Take 1 tablet (40 mg total) by mouth daily. 30 tablet 1   rosuvastatin (CRESTOR) 20 MG tablet Take 1 tablet (20 mg total) by mouth daily. 30 tablet 1   vitamin C (ASCORBIC ACID) 500 MG tablet Take 500 mg by mouth daily. Takes only during winter months     No current facility-administered medications on file prior to visit.    Allergies:   Allergies  Allergen Reactions   Valium [Diazepam] Other (See Comments)    Totally different personality      OBJECTIVE:  Physical Exam  Vitals:   05/16/22 1002  BP: (!) 145/73  Pulse: 78  Weight: 191 lb (86.6 kg)  Height: '5\' 4"'$  (1.626 m)   Body mass index is 32.79 kg/m. No results found.  Poststroke PHQ 2/9    05/16/2022   10:53 AM  Depression screen PHQ 2/9  Decreased Interest 0  Down, Depressed, Hopeless 0  PHQ - 2 Score 0     General: well developed, well nourished, very pleasant elderly Caucasian female, seated, in no evident distress Head: head normocephalic and atraumatic.   Neck: supple with no carotid or supraclavicular bruits Cardiovascular: regular rate and rhythm, no murmurs Musculoskeletal: no deformity Skin:  no rash/petichiae Vascular:  Normal pulses all extremities   Neurologic Exam Mental Status: Awake and fully alert.  Mild to moderate dysarthria, can be understood without great difficulty oriented to place and time.  No evidence of aphasia.  Recent and remote memory intact. Attention span, concentration and fund of knowledge appropriate. Mood and  affect appropriate.  Cranial Nerves: Fundoscopic exam reveals sharp disc margins. Pupils equal, briskly reactive to light. Extraocular movements full without nystagmus. Visual fields full to confrontation. Hearing intact. Facial sensation intact.  Mild right lower facial weakness.  Tongue, palate moves normally and symmetrically.  Motor: Normal bulk and tone. Normal strength in all tested extremity muscles except difficulty fully testing LUE due to shoulder issue Sensory.: intact to touch , pinprick , position and vibratory sensation.  Coordination: Rapid alternating movements normal in all extremities. Finger-to-nose and heel-to-shin performed accurately bilaterally. Gait and Station: Arises from chair without difficulty. Stance is normal. Gait demonstrates normal stride length and mild balance with use of cane. Tandem walk and heel toe not attempted.  Reflexes: 1+ and symmetric. Toes downgoing.     NIHSS  1 Modified Rankin  1      ASSESSMENT: Ellen Johnson is a 86 y.o. year old female with left MCA scattered infarcts on 04/15/7618 s/p TNK, embolic pattern secondary to unclear source. Vascular risk factors include HTN, HLD, advanced age and obesity.      PLAN:  Cryptogenic stroke:  Residual deficit: Mild to moderate dysarthria, continue gradual improvement working with SLP which was highly encouraged she continues with.  F/u with cardiology next month to review cardiac monitor results and possible ILR placement Continue aspirin '81mg'$  daily and Crestor 20 mg daily for secondary stroke prevention.  Advised to ensure she is no longer taking clopidogrel as 3 weeks DAPT completed and no indication for prolonged use Discussed secondary stroke prevention measures and importance of close PCP follow up for aggressive stroke risk factor management including BP goal<130/90, and HLD with LDL goal<70  Stroke labs 04/2022: LDL 101, A1c 5.7 I have gone over the pathophysiology of stroke, warning signs  and symptoms, risk factors and their management in some detail with instructions to go  to the closest emergency room for symptoms of concern.     Follow up in 4 months or call earlier if needed   CC:  [facility] provider: Dr. Leonie Man PCP: Rayann Heman, Shawn P, DO    I spent 59 minutes of face-to-face and non-face-to-face time with patient and pastor.  This included previsit chart review including review of recent hospitalization, lab review, study review, order entry, electronic health record documentation, patient and pastor education regarding recent stroke including etiology, secondary stroke prevention measures and importance of managing stroke risk factors, residual deficits and typical recovery time and answered all other questions to patient satisfaction   Frann Rider, AGNP-BC  [facility] [address] [place], [place] [address]  Phone [phone] Fax [phone] Note: This document was prepared with digital dictation and possible smart phrase technology. Any transcriptional errors that result from this process are unintentional.

## 2022-05-17 ENCOUNTER — Telehealth: Payer: Self-pay | Admitting: Student

## 2022-05-17 NOTE — Telephone Encounter (Signed)
New Message:    Patient's daughter wants to know how long is patient supposed to wear the Monitor she received? She also wants to know if she can bring her in the office to get help with putting it on please? She would like for you to leave a message, if she does not answer please leave a message.

## 2022-05-17 NOTE — Progress Notes (Signed)
I agree with the above plan 

## 2022-05-21 ENCOUNTER — Ambulatory Visit: Payer: Medicare HMO | Attending: Cardiology

## 2022-05-21 DIAGNOSIS — I639 Cerebral infarction, unspecified: Secondary | ICD-10-CM | POA: Diagnosis not present

## 2022-05-21 DIAGNOSIS — I4891 Unspecified atrial fibrillation: Secondary | ICD-10-CM

## 2022-05-21 NOTE — Progress Notes (Unsigned)
[employer] event monitor mailed to patient 04/16/22.  Applied to patient in office 05/21/22.  Dr. Lovena Le to read.

## 2022-05-22 ENCOUNTER — Telehealth: Payer: Self-pay | Admitting: Internal Medicine

## 2022-05-22 NOTE — Telephone Encounter (Signed)
Patient's daughter requesting home health orders for someone to come help the patient with her heart monitor. She says to call her mother: [phone], or her brother: [phone]

## 2022-05-22 NOTE — Telephone Encounter (Signed)
Patient states she was confused upon waking this morning and stated this morning it was saying the battery was dead, but she has it charged it, and was told she can't go farther than 10 feet from the monitor. I advised that she should call the 1-800 number that's on the phone which accompanied the preventice monitor, but that I will send this to Lollie Marrow to review. Pt states Lollie Marrow was "wonderful" but she will call the 800 number to the company now.

## 2022-05-22 NOTE — Telephone Encounter (Signed)
Pt returning call

## 2022-05-22 NOTE — Telephone Encounter (Signed)
Patient's daughter states the patient's phone died for her heart monitor. She would like to know if that is normal. Phone: [phone]

## 2022-05-22 NOTE — Telephone Encounter (Signed)
Returned call to patient, no answer. Left message asking that she call office back with details on what her issue is with her heart monitor and we can try to help her, but that we don't issue home health orders. Awaiting call back.

## 2022-05-22 NOTE — Telephone Encounter (Signed)
Patient's daughter says the call could also go to her brother Eddie Dibbles. Phone: [phone]

## 2022-05-23 NOTE — Telephone Encounter (Signed)
I would ask that you make sure your mom has plugged the phone in to charge it. If she has, and it still will not turn on, you will need to contact the [employer] monitor company at [phone].  They may need to send you an entire new monitor kit since the 2 batteries are linked to each kits phone. Please give me a call if you need more assistance.

## 2022-06-19 NOTE — Telephone Encounter (Signed)
LM for pt to call back.

## 2022-06-20 NOTE — Telephone Encounter (Signed)
LM to call back.

## 2022-06-21 NOTE — Telephone Encounter (Signed)
I spoke with the patient and she assured me that everything was clear. She has moved back to her home. I reminded her of her appt date/time.

## 2022-06-28 ENCOUNTER — Ambulatory Visit: Payer: Medicare HMO | Attending: Internal Medicine | Admitting: Internal Medicine

## 2022-06-28 ENCOUNTER — Encounter: Payer: Self-pay | Admitting: Internal Medicine

## 2022-06-28 DIAGNOSIS — I4891 Unspecified atrial fibrillation: Secondary | ICD-10-CM | POA: Diagnosis not present

## 2022-06-28 DIAGNOSIS — I493 Ventricular premature depolarization: Secondary | ICD-10-CM

## 2022-06-28 NOTE — Progress Notes (Signed)
HPI Ellen Johnson is referred for evaluation of a cryptogenic stroke. She is a pleasant 86 yo woman with HTN, who sustained a stroke early in the summer. She has worn a cardiac monitor which did not show a reason for her stroke. She has done well with minimal residual speech problems.  Allergies  Allergen Reactions   Valium [Diazepam] Other (See Comments)    Totally different personality     Current Outpatient Medications  Medication Sig Dispense Refill   amLODipine (NORVASC) 5 MG tablet Take 1 tablet (5 mg total) by mouth daily.     aspirin EC 81 MG tablet Take 1 tablet (81 mg total) by mouth daily. 30 tablet 1   Cyanocobalamin (VITAMIN B 12) 100 MCG LOZG Take 100 mg by mouth daily.     cycloSPORINE modified (NEORAL) 25 MG capsule Take 25 mg by mouth daily. Patient states it was 1 t capsule twice daily- MD changed this recently - to once daily     ferrous sulfate 324 MG TBEC Take 324 mg by mouth daily with breakfast.     fish oil-omega-3 fatty acids 1000 MG capsule Take 1 g by mouth daily.      furosemide (LASIX) 20 MG tablet Take 1 tablet (20 mg total) by mouth daily.     gabapentin (NEURONTIN) 100 MG capsule Take 100 mg by mouth daily.     GINSENG PO Take by mouth daily.     hydrOXYzine (ATARAX) 25 MG tablet Take 25 mg by mouth at bedtime as needed for itching.     mirabegron ER (MYRBETRIQ) 25 MG TB24 tablet Take 25 mg by mouth daily.     Multiple Vitamin (MULTIVITAMIN WITH MINERALS) TABS tablet Take 1 tablet by mouth daily.     pantoprazole (PROTONIX) 40 MG tablet Take 1 tablet (40 mg total) by mouth daily. 30 tablet 1   rosuvastatin (CRESTOR) 20 MG tablet Take 1 tablet (20 mg total) by mouth daily. 30 tablet 1   vitamin C (ASCORBIC ACID) 500 MG tablet Take 500 mg by mouth daily. Takes only during winter months     No current facility-administered medications for this visit.     Past Medical History:  Diagnosis Date   Anemia    Diverticulosis    GERD (gastroesophageal  reflux disease)    HTN (hypertension)    Hx of colonic polyps     ROS:   All systems reviewed and negative except as noted in the HPI.   Past Surgical History:  Procedure Laterality Date   ABDOMINAL HYSTERECTOMY     TAH BSO   APPENDECTOMY     CHOLECYSTECTOMY     FRACTURE SURGERY       Family History  Problem Relation Age of Onset   Stroke Mother    Colon cancer Father    Stroke Father    Stroke Maternal Grandmother    Breast cancer Paternal Grandmother      Social History   Socioeconomic History   Marital status: Widowed    Spouse name: Not on file   Number of children: 7   Years of education: Not on file   Highest education level: Associate degree: academic program  Occupational History   Occupation: RN  Tobacco Use   Smoking status: Never    Passive exposure: Past   Smokeless tobacco: Never  Vaping Use   Vaping Use: Never used  Substance and Sexual Activity   Alcohol use: Yes    Comment:  4 oz weekly of wine   Drug use: No   Sexual activity: Never  Other Topics Concern   Not on file  Social History Narrative   Not on file   Social Determinants of Health   Financial Resource Strain: Low Risk  (08/14/2017)   Overall Financial Resource Strain (CARDIA)    Difficulty of Paying Living Expenses: Not hard at all  Food Insecurity: No Food Insecurity (08/14/2017)   Hunger Vital Sign    Worried About Running Out of Food in the Last Year: Never true    Ran Out of Food in the Last Year: Never true  Transportation Needs: No Transportation Needs (08/14/2017)   PRAPARE - Hydrologist (Medical): No    Lack of Transportation (Non-Medical): No  Physical Activity: Sufficiently Active (08/14/2017)   Exercise Vital Sign    Days of Exercise per Week: 2 days    Minutes of Exercise per Session: 120 min  Stress: Stress Concern Present (08/14/2017)   [place]    Feeling of  Stress : To some extent  Social Connections: Moderately Integrated (08/14/2017)   Social Connection and Isolation Panel [NHANES]    Frequency of Communication with Friends and Family: More than three times a week    Frequency of Social Gatherings with Friends and Family: More than three times a week    Attends Religious Services: More than 4 times per year    Active Member of [employer] or Organizations: Yes    Attends Archivist Meetings: More than 4 times per year    Marital Status: Widowed  Intimate Partner Violence: Not At Risk (08/14/2017)   Humiliation, Afraid, Rape, and Kick questionnaire    Fear of Current or Ex-Partner: No    Emotionally Abused: No    Physically Abused: No    Sexually Abused: No     BP 122/66   Pulse 76   Ht '5\' 4"'$  (1.626 m)   Wt 188 lb (85.3 kg)   SpO2 95%   BMI 32.27 kg/m   Physical Exam:  Well appearing NAD HEENT: Unremarkable Neck:  No JVD, no thyromegally Lymphatics:  No adenopathy Back:  No CVA tenderness Lungs:  Clear with no wheezes HEART:  Regular rate rhythm, no murmurs, no rubs, no clicks Abd:  soft, positive bowel sounds, no organomegally, no rebound, no guarding Ext:  2 plus pulses, no edema, no cyanosis, no clubbing Skin:  No rashes no nodules Neuro:  CN II through XII intact, motor grossly intact   Assess/Plan:  Cryptogenic stroke - I have discussed the treatment options with the patient and recommend insertion of an implantable loop recorder for additional eval of her cryptogenic stroke. She will call us if she wishes to proceed.  Carleene Overlie Dalphine Cowie,MD

## 2022-06-28 NOTE — Patient Instructions (Addendum)
Medication Instructions:  Your physician recommends that you continue on your current medications as directed. Please refer to the Current Medication list given to you today.  *If you need a refill on your cardiac medications before your next appointment, please call your pharmacy*  Lab Work: None ordered.  If you have labs (blood work) drawn today and your tests are completely normal, you will receive your results only by: [place] (if you have MyChart) OR A paper copy in the mail If you have any lab test that is abnormal or we need to change your treatment, we will call you to review the results.  Testing/Procedures: None ordered.  Follow-Up: At [facility], you and your health needs are our priority.  As part of our continuing mission to provide you with exceptional heart care, we have created designated Provider Care Teams.  These Care Teams include your primary Cardiologist (physician) and Advanced Practice Providers (APPs -  Physician Assistants and Nurse Practitioners) who all work together to provide you with the care you need, when you need it.  We recommend signing up for the patient portal called "MyChart".  Sign up information is provided on this After Visit Summary.  MyChart is used to connect with patients for Virtual Visits (Telemedicine).  Patients are able to view lab/test results, encounter notes, upcoming appointments, etc.  Non-urgent messages can be sent to your provider as well.   To learn more about what you can do with MyChart, go to [email or web address].    Your next appointment:   Please schedule an appointment for Dr. Cristopher Peru to place a Medtronic Implantable Loop Recorder, within the next 3-4 weeks. [facility] Auth Pending but required. )  The format for your next appointment:   In Person  Provider:   Cristopher Peru, MD{or one of the following Advanced Practice Providers on your designated Care Team:   Tommye Standard, [place] Legrand Como "Jonni Sanger" Chalmers Cater,  [place]   Implantable Loop Recorder Placement  An implantable loop recorder is a small electronic device that is placed under the skin of your chest. The device records the electrical activity of your heart over a long period of time. Your health care provider can download these recordings to monitor your heart. You may need an implantable loop recorder if you have periods of abnormal heart activity (arrhythmias) or unexplained fainting (syncope). The recorder can be left in place for 1 year or longer. Tell a health care provider about: Any allergies you have. All medicines you are taking, including vitamins, herbs, eye drops, creams, and over-the-counter medicines. Any problems you or family members have had with anesthetic medicines. Any bleeding problems you have. Any surgeries you have had. Any medical conditions you have. Whether you are pregnant or may be pregnant. What are the risks? Generally, this is a safe procedure. However, problems may occur, including: Infection. Bleeding. Allergic reactions to anesthetic medicines. Damage to nerves or blood vessels. Failure of the device to work. This could require another surgery to replace it. What happens before the procedure?  You may have a physical exam, blood tests, and imaging tests of your heart, such as a chest X-ray. Follow instructions from your health care provider about eating or drinking restrictions. Ask your health care provider about: Changing or stopping your regular medicines. This is especially important if you are taking diabetes medicines or blood thinners. Taking medicines such as aspirin and ibuprofen. These medicines can thin your blood. Do not take these medicines unless your health care provider  tells you to take them. Taking over-the-counter medicines, vitamins, herbs, and supplements. Ask your health care provider how your surgical site will be marked or identified. Ask your health care provider what steps will  be taken to help prevent infection. These may include: Removing hair at the surgery site. Washing skin with a germ-killing soap. Plan to have someone take you home from the hospital or clinic. Plan to have a responsible adult care for you for at least 24 hours after you leave the hospital or clinic. This is important. Do not use any products that contain nicotine or tobacco, such as cigarettes and e-cigarettes. If you need help quitting, ask your health care provider. What happens during the procedure? An IV will be inserted into one of your veins. You may be given one or more of the following: A medicine to help you relax (sedative). A medicine to numb the area (local anesthetic). A small incision will be made on the left side of your upper chest. A pocket will be created under your skin. The device will be placed in the pocket. The incision will be closed with stitches (sutures) or adhesive strips. A bandage (dressing) will be placed over the incision. The procedure may vary among health care providers and hospitals. What happens after the procedure? Your blood pressure, heart rate, breathing rate, and blood oxygen level will be monitored until you leave the hospital or clinic. You may be able to go home on the day of your surgery. Before you go home: Your health care provider will program your recorder. You will learn how to trigger your device with a handheld activator. You will learn how to send recordings to your health care provider. You will get an ID card for your device, and you will be told when to use it. Do not drive for 24 hours if you were given a sedative during your procedure. Summary An implantable loop recorder is a small electronic device that is placed under the skin of your chest to monitor your heart over a long period of time. The recorder can be left in place for 1 year or longer. Plan to have someone take you home from the hospital or clinic. This information is  not intended to replace advice given to you by your health care provider. Make sure you discuss any questions you have with your health care provider. Document Revised: 11/07/2021 Document Reviewed: 12/27/2020 Elsevier Patient Education  [place].

## 2022-07-18 ENCOUNTER — Telehealth: Payer: Self-pay | Admitting: Internal Medicine

## 2022-07-18 NOTE — Telephone Encounter (Signed)
Patient stated she would like to have the loop recorder surgery on Dec. 1 or when next available.  Patient stated her insurance has approved the surgery.  Patient stated she will be with her family for Thanksgiving.

## 2022-07-19 NOTE — Telephone Encounter (Signed)
Pt is scheduled for ILR implant on 08/20/22. She wants to know if this will affect the cataract surgery she is scheduled for in January

## 2022-07-19 NOTE — Telephone Encounter (Signed)
Pt called back and educated on the loop recorder, loop recorder procedure, and post loop recorder placement instructions.  Pt also educated on how the loop recorder will NOT impact her cataract procedure with her Eye Doctor.    Pt understood all instructions and will see [name] on 08/20/22.

## 2022-07-23 ENCOUNTER — Other Ambulatory Visit: Payer: Self-pay | Admitting: Family Medicine

## 2022-07-23 DIAGNOSIS — Z1231 Encounter for screening mammogram for malignant neoplasm of breast: Secondary | ICD-10-CM

## 2022-07-24 ENCOUNTER — Other Ambulatory Visit: Payer: Self-pay

## 2022-07-24 NOTE — Patient Outreach (Signed)
   First telephone outreach attempt to obtain mRS. No answer. Left message for returned call.  Yazleemar Strassner [facility]-Care Management Assistant [phone]  

## 2022-07-27 ENCOUNTER — Other Ambulatory Visit: Payer: Self-pay

## 2022-07-27 NOTE — Patient Outreach (Signed)
Patient called back, obtained mRS successfully. MRS= 0   [place] Care Management Assistant [phone]

## 2022-07-27 NOTE — Patient Outreach (Signed)
   Second telephone outreach attempt to obtain mRS. No answer. Left message for returned call.  Decklyn Hornik [facility]-Care Management Assistant [phone]  

## 2022-07-30 ENCOUNTER — Telehealth: Payer: Self-pay | Admitting: Internal Medicine

## 2022-07-30 NOTE — Telephone Encounter (Signed)
Pt states her other doctor wants her to have a mammogram before her appt with Dr. Lovena Le on 12//11/23 however she can't find anyone to do her mammogram until January so she needs to r/s her appt with Dr. Lovena Le.

## 2022-07-30 NOTE — Telephone Encounter (Signed)
Pt called, left voicemail message that her message was received in Triage, I will be forwarding her message to scheduling.  Her appointment with Dr. Lovena Le will needed to be rescheduled for January, since her PCP recommends the mammogram prior to the ILR implant.

## 2022-08-06 ENCOUNTER — Telehealth: Payer: Self-pay | Admitting: Internal Medicine

## 2022-08-06 NOTE — Telephone Encounter (Signed)
Pt daughter would like a call back about her mothers appt. She wants to know if it's important she have the ILR implanted sooner rather than later. Informed her appt was RS per pt d/t appts. Pt daughter would like a call.

## 2022-08-08 NOTE — Telephone Encounter (Signed)
Called and left message for daughter to call me back. Will explain that patient wanted ILR implant appointment moved back to January.  Follow up still required.

## 2022-08-08 NOTE — Telephone Encounter (Signed)
Pt daughter called back; discussed the ILR implant procedure in detail with Ms. Riki Sheer.   Pt stays up late, and does not always eat at regular times. I encouraged daughter to educate Pt on the importance of eating regularly, and to drink plenty of fluids and hydrate.  By doing this, she should feel better, and be able to perform her ADL's at home.  However, if any symptoms worsen, Pt daughter told to reach out to [facility] on [facility].  This was understood no follow up required.

## 2022-08-20 ENCOUNTER — Ambulatory Visit: Payer: Medicare HMO | Admitting: Internal Medicine

## 2022-09-06 ENCOUNTER — Telehealth: Payer: Self-pay | Admitting: Internal Medicine

## 2022-09-06 NOTE — Telephone Encounter (Signed)
Pt is requesting call back to discuss appointment coming up on 01/09 with Dr. Lovena Le. She states that she was told she needed to have a mammogram done before this procedure and would like a call back to discuss further.

## 2022-09-11 NOTE — Telephone Encounter (Signed)
Pt called, left message to call [facility] triage back.  F/u required.

## 2022-09-13 NOTE — Telephone Encounter (Signed)
Pt called back to address concerns about the ILR appointment with Dr. Lovena Le on 09/18/2022, and having a mammogram.    I left a voicemail message for this patient to call me back.  Her [employer] Auth EXPIRES 10/02/2022, and was calling to advise her to have the Implantable loop Recorder done first; Then have the mammogram scheduled.  Follow up required.

## 2022-09-18 ENCOUNTER — Ambulatory Visit: Payer: Medicare PPO | Attending: Internal Medicine | Admitting: Internal Medicine

## 2022-09-18 VITALS — BP 128/68 | HR 76 | Ht 64.0 in | Wt 181.0 lb

## 2022-09-18 DIAGNOSIS — I10 Essential (primary) hypertension: Secondary | ICD-10-CM | POA: Diagnosis not present

## 2022-09-18 DIAGNOSIS — I4891 Unspecified atrial fibrillation: Secondary | ICD-10-CM

## 2022-09-18 DIAGNOSIS — I493 Ventricular premature depolarization: Secondary | ICD-10-CM

## 2022-09-18 DIAGNOSIS — I639 Cerebral infarction, unspecified: Secondary | ICD-10-CM

## 2022-09-18 NOTE — Progress Notes (Signed)
HPI [name] returns today to consider ILR insertion. She has a h/o cryptogenic stroke who I saw several months ago. She would like to proceed with ILR insertion. In the interim she has not had any additional neuro symptoms. She has worn a zio monitor which did not demonstrate any atrial fib. She has preserved LV function by echo. Since her stroke she has done well with minimal residual.  Allergies  Allergen Reactions   Valium [Diazepam] Other (See Comments)    Totally different personality     Current Outpatient Medications  Medication Sig Dispense Refill   amLODipine (NORVASC) 5 MG tablet Take 1 tablet (5 mg total) by mouth daily.     aspirin EC 81 MG tablet Take 1 tablet (81 mg total) by mouth daily. 30 tablet 1   Cyanocobalamin (VITAMIN B 12) 100 MCG LOZG Take 100 mg by mouth daily.     cycloSPORINE modified (NEORAL) 25 MG capsule Take 25 mg by mouth daily. Patient states it was 1 t capsule twice daily- MD changed this recently - to once daily     ferrous sulfate 324 MG TBEC Take 324 mg by mouth daily with breakfast.     fish oil-omega-3 fatty acids 1000 MG capsule Take 1 g by mouth daily.      furosemide (LASIX) 20 MG tablet Take 1 tablet (20 mg total) by mouth daily.     gabapentin (NEURONTIN) 100 MG capsule Take 100 mg by mouth daily.     GINSENG PO Take by mouth daily.     hydrOXYzine (ATARAX) 25 MG tablet Take 25 mg by mouth at bedtime as needed for itching.     mirabegron ER (MYRBETRIQ) 25 MG TB24 tablet Take 25 mg by mouth daily.     Multiple Vitamin (MULTIVITAMIN WITH MINERALS) TABS tablet Take 1 tablet by mouth daily.     pantoprazole (PROTONIX) 40 MG tablet Take 1 tablet (40 mg total) by mouth daily. 30 tablet 1   rosuvastatin (CRESTOR) 20 MG tablet Take 1 tablet (20 mg total) by mouth daily. 30 tablet 1   vitamin C (ASCORBIC ACID) 500 MG tablet Take 500 mg by mouth daily. Takes only during winter months     No current facility-administered medications for this  visit.     Past Medical History:  Diagnosis Date   Anemia    Diverticulosis    GERD (gastroesophageal reflux disease)    HTN (hypertension)    Hx of colonic polyps     ROS:   All systems reviewed and negative except as noted in the HPI.   Past Surgical History:  Procedure Laterality Date   ABDOMINAL HYSTERECTOMY     TAH BSO   APPENDECTOMY     CHOLECYSTECTOMY     FRACTURE SURGERY       Family History  Problem Relation Age of Onset   Stroke Mother    Colon cancer Father    Stroke Father    Stroke Maternal Grandmother    Breast cancer Paternal Grandmother      Social History   Socioeconomic History   Marital status: Widowed    Spouse name: Not on file   Number of children: 7   Years of education: Not on file   Highest education level: Associate degree: academic program  Occupational History   Occupation: RN  Tobacco Use   Smoking status: Never    Passive exposure: Past   Smokeless tobacco: Never  Vaping Use  Vaping Use: Never used  Substance and Sexual Activity   Alcohol use: Yes    Comment: 4 oz weekly of wine   Drug use: No   Sexual activity: Never  Other Topics Concern   Not on file  Social History Narrative   Not on file   Social Determinants of Health   Financial Resource Strain: Low Risk  (08/14/2017)   Overall Financial Resource Strain (CARDIA)    Difficulty of Paying Living Expenses: Not hard at all  Food Insecurity: No Food Insecurity (08/14/2017)   Hunger Vital Sign    Worried About Running Out of Food in the Last Year: Never true    Ran Out of Food in the Last Year: Never true  Transportation Needs: No Transportation Needs (08/14/2017)   PRAPARE - Hydrologist (Medical): No    Lack of Transportation (Non-Medical): No  Physical Activity: Sufficiently Active (08/14/2017)   Exercise Vital Sign    Days of Exercise per Week: 2 days    Minutes of Exercise per Session: 120 min  Stress: Stress Concern Present  (08/14/2017)   [place]    Feeling of Stress : To some extent  Social Connections: Moderately Integrated (08/14/2017)   Social Connection and Isolation Panel [NHANES]    Frequency of Communication with Friends and Family: More than three times a week    Frequency of Social Gatherings with Friends and Family: More than three times a week    Attends Religious Services: More than 4 times per year    Active Member of [employer] or Organizations: Yes    Attends Archivist Meetings: More than 4 times per year    Marital Status: Widowed  Intimate Partner Violence: Not At Risk (08/14/2017)   Humiliation, Afraid, Rape, and Kick questionnaire    Fear of Current or Ex-Partner: No    Emotionally Abused: No    Physically Abused: No    Sexually Abused: No     BP 128/68   Pulse 76   Ht '5\' 4"'$  (1.626 m)   Wt 181 lb (82.1 kg)   SpO2 98%   BMI 31.07 kg/m   Physical Exam:  Well appearing NAD HEENT: Unremarkable Neck:  No JVD, no thyromegally Lymphatics:  No adenopathy Back:  No CVA tenderness Lungs:  Clear HEART:  Regular rate rhythm, no murmurs, no rubs, no clicks Abd:  soft, positive bowel sounds, no organomegally, no rebound, no guarding Ext:  2 plus pulses, no edema, no cyanosis, no clubbing Skin:  No rashes no nodules Neuro:  CN II through XII intact, motor grossly intact   Assess/Plan: Cryptogenic stroke - I have discussed the treatment options and she wishes to proceed  with ILR insertion. HTN - her bp is controlled. We will follow.  EP Procedure Note  Procedure performed: ILR insertion.   Preoperative diagnosis: cryptogenic stroke  Postoperative diagnosis: same as preop  Description of the procedure: after informed consent was obtained, the patient was prepped in a sterile fashion. 8 cc of lidocaine was infiltrated into the left pectoral region. A one cm stab incision was carried out. The Medtronic ILR, #  [id number] was inserted. The R waves measured 0.6 mV. Benzoin and steristrips were painted on the skin and a bandaged applied and the patient recovered in the usual manner.  Complications: none immediately  Conclusion: successful ILR for cryptogenic stroke.  Carleene Overlie Wyona Neils,MD

## 2022-09-18 NOTE — Patient Instructions (Addendum)
Medication Instructions:  Your physician recommends that you continue on your current medications as directed. Please refer to the Current Medication list given to you today.  Labwork: None ordered.  Testing/Procedures: None ordered.  Follow-Up:  Your physician wants you to follow-up in: one year with Dr. Lovena Le or App EP.  You will receive a reminder letter in the mail two months in advance. If you don't receive a letter, please call our office to schedule the follow-up appointment.    Implantable Loop Recorder Removal, Care After This sheet gives you information about how to care for yourself after your procedure. Your health care provider may also give you more specific instructions. If you have problems or questions, contact your health care provider. What can I expect after the procedure? After the procedure, it is common to have: Soreness or discomfort near the incision. Some swelling or bruising near the incision.  Follow these instructions at home: Incision care  Monitor your cardiac device site for redness, swelling, and drainage. Call the device clinic at [phone] if you experience these symptoms or fever/chills.  Keep the large square bandage on your site for 24 hours and then you may remove it yourself. Keep the steri-strips underneath in place.   You may shower after 72 hours / 3 days from your procedure with the steri-strips in place. They will usually fall off on their own, or may be removed after 10 days. Pat dry.   Avoid lotions, ointments, or perfumes over your incision until it is well-healed.  Please do not submerge in water until your site is completely healed.   If your wound site starts to bleed apply pressure.       If you have any questions/concerns please call the device clinic at [phone].  Activity  Return to your normal activities.  Contact a health care provider if: You have redness, swelling, or pain around your incision. You have a  fever.

## 2022-09-19 ENCOUNTER — Telehealth: Payer: Self-pay | Admitting: Internal Medicine

## 2022-09-19 NOTE — Telephone Encounter (Signed)
Patient calling in stating that there is bleeding from her surgery from yesterday. She states that she is bleeding going up under the patch. Please advise

## 2022-09-19 NOTE — Telephone Encounter (Signed)
Pt called [facility] triage concerned about ILR implant bleeding.    Pt stated this happened last night, and she held pressure on her wound site for 2 / 15 minute increments, 30 minutes total.   She had some blood ooze from the wound site under the telfa dressing, but the bleeding stayed under the dressing.   I advised Pt to wear this until this evening, then follow steps in discharge AVS.  Pt stated she lost the discharge AVS, so steps of care were shared again over the telephone.  Pt advised not to removed the steri-strips, to let them fall off on their own.  Pt understood all instructions shared over the telephone, and will let us know if she has any other concerns with her ILR implant.  No follow up required at this time.

## 2022-09-20 ENCOUNTER — Telehealth: Payer: Self-pay | Admitting: Internal Medicine

## 2022-09-20 NOTE — Telephone Encounter (Signed)
Patient called stating she had dried blood on her steri-strips and is she concerned she will get an infection.  She states she did apply a new bandage on top of it.  She had called yesterday about the incision site bleeding.

## 2022-09-20 NOTE — Telephone Encounter (Signed)
Patient called in directly to device clinic. She was very upset almost to point of tears. Concerns were that her steri-strips were showing a lot of "caked blood". She states, "that's not good and I wish I never had this done".   I explained to patient that it is normal to see the dried blood on the steri - strips since she had some drainage yesterday.  She denies seeing any active drainage or bleeding from the wound.  There is no pain or other symptom/complaint other than concern for the strips.  She is just worried about the dried dark blood present.   I offered for patient to come today or tomorrow to the office if she is that concerned for me to review. She refused, even though daughter present and aware, stating she didn't have a ride.  I let her know that the dried drainage at the site on the strips is normal. This means that bleeding has stopped and the area is scabbing/healing.   She appeared to be reassured by this and refused to come to office or send pic through email or mychart.   I reviewed with her keeping the area dry for now. Wait to shower until Sunday. Be careful with the area and allow the strips to come off on their own over time.   If any concerns for infection develop, call the device clinic and let us know. The daughter, Eustaquio Maize, was present and spoke with her at the end of the conversation.  She is aware of the above and that we are available to check the wound site today or tomorrow should either of them have any other concerns.  Beth verbalizes understanding and wanted me to make sure we have her number: [phone] to communicate with if needed.  I do see she is listed as an emergency contact for patient.

## 2022-09-25 ENCOUNTER — Ambulatory Visit: Payer: Medicare HMO | Admitting: Adult Health

## 2022-10-02 ENCOUNTER — Telehealth: Payer: Self-pay | Admitting: Adult Health

## 2022-10-02 NOTE — Telephone Encounter (Signed)
Pt is calling. Wanting to know why she Ellen Johnson put her on Potassium. Pt is requesting a call back.

## 2022-10-03 ENCOUNTER — Ambulatory Visit: Payer: Medicare HMO | Admitting: Adult Health

## 2022-10-03 NOTE — Telephone Encounter (Signed)
Please advise patient that I did not place her on potassium nor do I see potassium on her med list.  If she is currently taking potassium, advise her to follow-up with her PCP to discuss if ongoing use is indicated.

## 2022-10-03 NOTE — Telephone Encounter (Signed)
IF PATIENT RETURN CALL, PLEASE RELAY JM MESSAGE

## 2022-10-03 NOTE — Telephone Encounter (Signed)
Called pt. Left VM message to please call office.

## 2022-10-24 ENCOUNTER — Ambulatory Visit: Payer: Medicare PPO

## 2022-10-24 DIAGNOSIS — I639 Cerebral infarction, unspecified: Secondary | ICD-10-CM | POA: Diagnosis not present

## 2022-10-24 LAB — CUP PACEART REMOTE DEVICE CHECK
Date Time Interrogation Session: 20240214123702
Implantable Pulse Generator Implant Date: 20240109

## 2022-11-23 NOTE — Progress Notes (Signed)
Carelink Summary Report / Loop Recorder 

## 2022-11-26 ENCOUNTER — Ambulatory Visit: Payer: Medicare PPO

## 2022-11-26 DIAGNOSIS — I639 Cerebral infarction, unspecified: Secondary | ICD-10-CM

## 2022-11-27 LAB — CUP PACEART REMOTE DEVICE CHECK
Date Time Interrogation Session: 20240318124059
Implantable Pulse Generator Implant Date: 20240109

## 2022-11-28 ENCOUNTER — Telehealth: Payer: Self-pay | Admitting: Internal Medicine

## 2022-11-28 NOTE — Telephone Encounter (Signed)
Tried to call patient back after seeing this message.  No answer.  I did leave a message with our device clinic number to please call back in the morning after 830.

## 2022-11-28 NOTE — Telephone Encounter (Signed)
Attempted to call patient to follow up. No answer. LMTCB.

## 2022-11-28 NOTE — Telephone Encounter (Signed)
Pt is returning call.  

## 2022-11-28 NOTE — Telephone Encounter (Signed)
Pt states her chest hurts to the touch where her device is placed. She states it has been going on for 3 days now. She denies any other symptoms and it only hurts when she touches it. Please advise  ?

## 2022-11-28 NOTE — Telephone Encounter (Signed)
Patient is returning call. And is very upset because she dont know what she needs to do. Please advise

## 2022-11-30 NOTE — Telephone Encounter (Signed)
Patient returned RN's call. 

## 2022-11-30 NOTE — Telephone Encounter (Signed)
Numerous attempts to contact pt closing encounter

## 2022-11-30 NOTE — Telephone Encounter (Signed)
Called pt today to schedule with device clinic, LMOM.

## 2022-11-30 NOTE — Telephone Encounter (Signed)
Attempted to call patient back to make DC apt. No answer, LMTCB.

## 2022-12-03 NOTE — Telephone Encounter (Signed)
Left message requesting  call back.

## 2022-12-04 NOTE — Telephone Encounter (Signed)
Left another message for Pt requesting call back DIRECTLY to the device clinic.  Left contact number for the device clinic.  Attempted to call daughter but call did not go through.

## 2022-12-04 NOTE — Telephone Encounter (Signed)
Call back received from Pt.  She states the pain at her ILR site was sore for a few days but has since resolved.  She will call with further concerns.

## 2022-12-31 ENCOUNTER — Ambulatory Visit (INDEPENDENT_AMBULATORY_CARE_PROVIDER_SITE_OTHER): Payer: Medicare PPO

## 2022-12-31 DIAGNOSIS — I639 Cerebral infarction, unspecified: Secondary | ICD-10-CM

## 2022-12-31 LAB — CUP PACEART REMOTE DEVICE CHECK
Date Time Interrogation Session: 20240421230211
Implantable Pulse Generator Implant Date: 20240109

## 2023-01-07 NOTE — Progress Notes (Signed)
Carelink Summary Report / Loop Recorder 

## 2023-02-05 ENCOUNTER — Ambulatory Visit (INDEPENDENT_AMBULATORY_CARE_PROVIDER_SITE_OTHER): Payer: Medicare PPO

## 2023-02-05 DIAGNOSIS — I639 Cerebral infarction, unspecified: Secondary | ICD-10-CM | POA: Diagnosis not present

## 2023-02-05 LAB — CUP PACEART REMOTE DEVICE CHECK
Date Time Interrogation Session: 20240524230306
Implantable Pulse Generator Implant Date: 20240109

## 2023-02-05 NOTE — Progress Notes (Signed)
Carelink Summary Report / Loop Recorder 

## 2023-03-01 NOTE — Progress Notes (Signed)
Carelink Summary Report / Loop Recorder 

## 2023-03-07 LAB — CUP PACEART REMOTE DEVICE CHECK
Date Time Interrogation Session: 20240626230444
Implantable Pulse Generator Implant Date: 20240109

## 2023-03-11 ENCOUNTER — Ambulatory Visit (INDEPENDENT_AMBULATORY_CARE_PROVIDER_SITE_OTHER): Payer: Medicare PPO

## 2023-03-11 DIAGNOSIS — I639 Cerebral infarction, unspecified: Secondary | ICD-10-CM

## 2023-03-26 NOTE — Progress Notes (Deleted)
[facility] [address] [place]. [place] [address] [phone]       HOSPITAL FOLLOW UP NOTE  [name] Date of Birth:  15-Oct-1935 Medical Record Number:  [id number]    Primary neurologist: Dr. Pearlean Brownie Reason for Referral:  hospital stroke follow up    SUBJECTIVE:   CHIEF COMPLAINT:  No chief complaint on file.   HPI:   Update 03/27/2023 JM: Patient returns for stroke follow-up.  Has been stable without new stroke/TIA symptoms.  Reports residual ***.   Continues on aspirin and Crestor.  Routinely follows with PCP for stroke risk factor management.  Cardiac monitor negative for A-fib and had ILR placed 09/2022.  Has not shown atrial fibrillation thus far.     History provided for reference purposes only Initial visit 05/16/2022 JM: Patient is being seen for initial hospital follow-up accompanied by her pastor.  She has been doing well since discharge without new stroke/TIA symptoms.  Reports residual dysarthria, has been working with SLP noting gradual improvement.  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  Denies any other residual stroke deficits.  Completed 3 weeks DAPT, remains on aspirin alone as well as Crestor, unsure if she is still on Plavix, denies side effects Blood pressure today 145/73, is working on getting her own BP cuff at home to routinely monitor Recently received cardiac monitor in the mail, has not yet started, is scheduled to see cardiology Dr. Ladona Ridgel next month  No further concerns at this time   Stroke admission 04/13/2022 [name] is a 87 y.o. female with history of HTN, GERD, anemia, psoriasis and fracture of left humerus 2 years ago who presented on 04/13/2022 with sudden onset dysarthria and right facial droop.  She was given TNK at the outside hospital ED and transferred to [facility] for further care.  Stroke work-up revealed left MCA  cerebral infarcts embolic pattern secondary to unclear source of concern for occult A-fib.  CTA head/neck unremarkable.  EF 60 to 65%.  LE Doppler negative for DVT.  Recommended placement of ILR to rule out atrial fibrillation.  LDL 101.  A1c 5.7.  Recommended DAPT for 3 weeks and aspirin alone as well as Crestor 20 mg daily.  PT/OT/SLP recommended home health therapy.  PERTINENT IMAGING  Per hospitalization 04/13/2022 Code Stroke CT head No acute abnormality.  CTA head & neck No LVO or hemodynamically significant stenosis MRI multiple foci of acute ischemia in left MCA territory 2D Echo EF 60 to 65% LE venous Doppler negative for DVT  LDL 101 HgbA1c 5.7    ROS:   14 system review of systems performed and negative with exception of those listed in HPI  PMH:  Past Medical History:  Diagnosis Date   Anemia    Diverticulosis    GERD (gastroesophageal reflux disease)    HTN (hypertension)    Hx of colonic polyps     PSH:  Past Surgical History:  Procedure Laterality Date   ABDOMINAL HYSTERECTOMY     TAH BSO   APPENDECTOMY     CHOLECYSTECTOMY     FRACTURE SURGERY      Social History:  Social History   Socioeconomic History   Marital status: Widowed    Spouse name: Not on file   Number of children: 7   Years of education: Not on file   Highest education level: Associate degree: academic program  Occupational History  Occupation: Charity fundraiser  Tobacco Use   Smoking status: Never    Passive exposure: Past   Smokeless tobacco: Never  Vaping Use   Vaping status: Never Used  Substance and Sexual Activity   Alcohol use: Yes    Comment: 4 oz weekly of wine   Drug use: No   Sexual activity: Never  Other Topics Concern   Not on file  Social History Narrative   Not on file   Social Determinants of Health   Financial Resource Strain: Low Risk  (08/14/2017)   Overall Financial Resource Strain (CARDIA)    Difficulty of Paying Living Expenses: Not hard at all  Food Insecurity:  No Food Insecurity (08/14/2017)   Hunger Vital Sign    Worried About Running Out of Food in the Last Year: Never true    Ran Out of Food in the Last Year: Never true  Transportation Needs: No Transportation Needs (08/14/2017)   PRAPARE - Administrator, Civil Service (Medical): No    Lack of Transportation (Non-Medical): No  Physical Activity: Sufficiently Active (08/14/2017)   Exercise Vital Sign    Days of Exercise per Week: 2 days    Minutes of Exercise per Session: 120 min  Stress: Stress Concern Present (08/14/2017)   Ellen Johnson    Feeling of Stress : To some extent  Social Connections: Moderately Integrated (08/14/2017)   Social Connection and Isolation Panel [NHANES]    Frequency of Communication with Friends and Family: More than three times a week    Frequency of Social Gatherings with Friends and Family: More than three times a week    Attends Religious Services: More than 4 times per year    Active Member of [employer] or Organizations: Yes    Attends Banker Meetings: More than 4 times per year    Marital Status: Widowed  Intimate Partner Violence: Not At Risk (08/14/2017)   Humiliation, Afraid, Rape, and Kick Johnson    Fear of Current or Ex-Partner: No    Emotionally Abused: No    Physically Abused: No    Sexually Abused: No    Family History:  Family History  Problem Relation Age of Onset   Stroke Mother    Colon cancer Father    Stroke Father    Stroke Maternal Grandmother    Breast cancer Paternal Grandmother     Medications:   Current Outpatient Medications on File Prior to Visit  Medication Sig Dispense Refill   amLODipine (NORVASC) 5 MG tablet Take 1 tablet (5 mg total) by mouth daily.     aspirin EC 81 MG tablet Take 1 tablet (81 mg total) by mouth daily. 30 tablet 1   Cyanocobalamin (VITAMIN B 12) 100 MCG LOZG Take 100 mg by mouth daily.     cycloSPORINE modified  (NEORAL) 25 MG capsule Take 25 mg by mouth daily. Patient states it was 1 t capsule twice daily- MD changed this recently - to once daily     ferrous sulfate 324 MG TBEC Take 324 mg by mouth daily with breakfast.     fish oil-omega-3 fatty acids 1000 MG capsule Take 1 g by mouth daily.      furosemide (LASIX) 20 MG tablet Take 1 tablet (20 mg total) by mouth daily.     gabapentin (NEURONTIN) 100 MG capsule Take 100 mg by mouth daily.     GINSENG PO Take by mouth daily.  hydrOXYzine (ATARAX) 25 MG tablet Take 25 mg by mouth at bedtime as needed for itching.     mirabegron ER (MYRBETRIQ) 25 MG TB24 tablet Take 25 mg by mouth daily.     Multiple Vitamin (MULTIVITAMIN WITH MINERALS) TABS tablet Take 1 tablet by mouth daily.     pantoprazole (PROTONIX) 40 MG tablet Take 1 tablet (40 mg total) by mouth daily. 30 tablet 1   rosuvastatin (CRESTOR) 20 MG tablet Take 1 tablet (20 mg total) by mouth daily. 30 tablet 1   vitamin C (ASCORBIC ACID) 500 MG tablet Take 500 mg by mouth daily. Takes only during winter months     No current facility-administered medications on file prior to visit.    Allergies:   Allergies  Allergen Reactions   Valium [Diazepam] Other (See Comments)    Totally different personality      OBJECTIVE:  Physical Exam  There were no vitals filed for this visit.  There is no height or weight on file to calculate BMI. No results found.   General: well developed, well nourished, very pleasant elderly Caucasian female, seated, in no evident distress Head: head normocephalic and atraumatic.   Neck: supple with no carotid or supraclavicular bruits Cardiovascular: regular rate and rhythm, no murmurs Musculoskeletal: no deformity Skin:  no rash/petichiae Vascular:  Normal pulses all extremities   Neurologic Exam Mental Status: Awake and fully alert.  Mild to moderate dysarthria, can be understood without great difficulty oriented to place and time.  No evidence of  aphasia.  Recent and remote memory intact. Attention span, concentration and fund of knowledge appropriate. Mood and affect appropriate.  Cranial Nerves: Pupils equal, briskly reactive to light. Extraocular movements full without nystagmus. Visual fields full to confrontation. Hearing intact. Facial sensation intact.  Mild right lower facial weakness.  Tongue, palate moves normally and symmetrically.  Motor: Normal bulk and tone. Normal strength in all tested extremity muscles except difficulty fully testing LUE due to shoulder issue Sensory.: intact to touch , pinprick , position and vibratory sensation.  Coordination: Rapid alternating movements normal in all extremities. Finger-to-nose and heel-to-shin performed accurately bilaterally. Gait and Station: Arises from chair without difficulty. Stance is normal. Gait demonstrates normal stride length and mild balance with use of cane. Tandem walk and heel toe not attempted.  Reflexes: 1+ and symmetric. Toes downgoing.         ASSESSMENT: Ellen Johnson is a 87 y.o. year old female with left MCA scattered infarcts on 04/13/2022 s/p TNK, embolic pattern secondary to unclear source. Vascular risk factors include HTN, HLD, advanced age and obesity.      PLAN:  Cryptogenic stroke:  Residual deficit: Mild to moderate dysarthria, continue gradual improvement working with SLP which was highly encouraged she continues with.  S/p ILR 09/2022 - has not shown A fib thus far Continue aspirin 81mg  daily and Crestor 20 mg daily for secondary stroke prevention.  Advised to ensure she is no longer taking clopidogrel as 3 weeks DAPT completed and no indication for prolonged use Discussed secondary stroke prevention measures and importance of close PCP follow up for aggressive stroke risk factor management including BP goal<130/90, and HLD with LDL goal<70  Stroke labs 04/2022: LDL 101, A1c 5.7 I have gone over the pathophysiology of stroke, warning signs and  symptoms, risk factors and their management in some detail with instructions to go to the closest emergency room for symptoms of concern.     Follow up in 4 months or  call earlier if needed   CC:  [facility] provider: Dr. Pearlean Brownie PCP: Vivi Ferns, Shawn P, DO    I spent 59 minutes of face-to-face and non-face-to-face time with patient and pastor.  This included previsit chart review including review of recent hospitalization, lab review, study review, order entry, electronic health record documentation, patient education and discussion regarding above diagnoses and treatment plan and answered all other questions to patient's satisfaction   Ihor Austin, [facility]  [facility] [address] [place], [place] [address]  Phone [phone] Fax [phone] Note: This document was prepared with digital dictation and possible smart phrase technology. Any transcriptional errors that result from this process are unintentional.

## 2023-03-27 ENCOUNTER — Ambulatory Visit: Payer: Medicare PPO | Admitting: Adult Health

## 2023-03-28 NOTE — Progress Notes (Signed)
Carelink Summary Report / Loop Recorder 

## 2023-04-15 ENCOUNTER — Ambulatory Visit: Payer: Medicare PPO

## 2023-04-15 DIAGNOSIS — I639 Cerebral infarction, unspecified: Secondary | ICD-10-CM | POA: Diagnosis not present

## 2023-04-26 NOTE — Progress Notes (Signed)
Carelink Summary Report / Loop Recorder 

## 2023-05-20 ENCOUNTER — Ambulatory Visit (INDEPENDENT_AMBULATORY_CARE_PROVIDER_SITE_OTHER): Payer: Medicare PPO

## 2023-05-20 DIAGNOSIS — I639 Cerebral infarction, unspecified: Secondary | ICD-10-CM

## 2023-05-20 LAB — CUP PACEART REMOTE DEVICE CHECK
Date Time Interrogation Session: 20240906230744
Implantable Pulse Generator Implant Date: 20240109

## 2023-06-05 NOTE — Progress Notes (Signed)
Carelink Summary Report / Loop Recorder 

## 2023-06-24 ENCOUNTER — Ambulatory Visit (INDEPENDENT_AMBULATORY_CARE_PROVIDER_SITE_OTHER): Payer: Medicare PPO

## 2023-06-24 DIAGNOSIS — I639 Cerebral infarction, unspecified: Secondary | ICD-10-CM

## 2023-06-25 LAB — CUP PACEART REMOTE DEVICE CHECK
Date Time Interrogation Session: 20241013230546
Implantable Pulse Generator Implant Date: 20240109

## 2023-07-09 NOTE — Progress Notes (Signed)
Carelink Summary Report / Loop Recorder 

## 2023-07-29 ENCOUNTER — Ambulatory Visit (INDEPENDENT_AMBULATORY_CARE_PROVIDER_SITE_OTHER): Payer: Medicare PPO

## 2023-07-29 DIAGNOSIS — I639 Cerebral infarction, unspecified: Secondary | ICD-10-CM

## 2023-07-29 LAB — CUP PACEART REMOTE DEVICE CHECK
Date Time Interrogation Session: 20241117232043
Implantable Pulse Generator Implant Date: 20240109

## 2023-08-22 NOTE — Progress Notes (Signed)
Carelink Summary Report / Loop Recorder 

## 2023-09-02 ENCOUNTER — Ambulatory Visit: Payer: Medicare PPO

## 2023-09-02 DIAGNOSIS — I639 Cerebral infarction, unspecified: Secondary | ICD-10-CM

## 2023-09-02 LAB — CUP PACEART REMOTE DEVICE CHECK
Date Time Interrogation Session: 20241222231835
Implantable Pulse Generator Implant Date: 20240109

## 2023-10-07 ENCOUNTER — Ambulatory Visit (INDEPENDENT_AMBULATORY_CARE_PROVIDER_SITE_OTHER): Payer: Medicare PPO

## 2023-10-07 DIAGNOSIS — I639 Cerebral infarction, unspecified: Secondary | ICD-10-CM

## 2023-10-07 LAB — CUP PACEART REMOTE DEVICE CHECK
Date Time Interrogation Session: 20250126232129
Implantable Pulse Generator Implant Date: 20240109

## 2023-10-10 NOTE — Progress Notes (Signed)
Carelink Summary Report / Loop Recorder

## 2023-11-11 ENCOUNTER — Ambulatory Visit (INDEPENDENT_AMBULATORY_CARE_PROVIDER_SITE_OTHER): Payer: Medicare PPO

## 2023-11-11 DIAGNOSIS — I639 Cerebral infarction, unspecified: Secondary | ICD-10-CM | POA: Diagnosis not present

## 2023-11-12 LAB — CUP PACEART REMOTE DEVICE CHECK
Date Time Interrogation Session: 20250302231454
Implantable Pulse Generator Implant Date: 20240109

## 2023-11-14 NOTE — Progress Notes (Signed)
 Carelink Summary Report / Loop Recorder

## 2023-12-12 NOTE — Progress Notes (Signed)
 Carelink Summary Report / Loop Recorder

## 2023-12-12 NOTE — Addendum Note (Signed)
 Addended by: Geralyn Flash D on: 12/12/2023 01:59 PM   Modules accepted: Orders

## 2023-12-16 ENCOUNTER — Ambulatory Visit (INDEPENDENT_AMBULATORY_CARE_PROVIDER_SITE_OTHER): Payer: Medicare PPO

## 2023-12-16 DIAGNOSIS — I639 Cerebral infarction, unspecified: Secondary | ICD-10-CM

## 2023-12-17 LAB — CUP PACEART REMOTE DEVICE CHECK
Date Time Interrogation Session: 20250406231554
Implantable Pulse Generator Implant Date: 20240109

## 2024-01-20 ENCOUNTER — Ambulatory Visit (INDEPENDENT_AMBULATORY_CARE_PROVIDER_SITE_OTHER): Payer: Medicare PPO

## 2024-01-20 DIAGNOSIS — I639 Cerebral infarction, unspecified: Secondary | ICD-10-CM | POA: Diagnosis not present

## 2024-01-20 LAB — CUP PACEART REMOTE DEVICE CHECK
Date Time Interrogation Session: 20250511234109
Implantable Pulse Generator Implant Date: 20240109

## 2024-01-21 ENCOUNTER — Ambulatory Visit: Payer: Self-pay | Admitting: Internal Medicine

## 2024-01-23 ENCOUNTER — Other Ambulatory Visit: Payer: Self-pay | Admitting: Family Medicine

## 2024-01-23 DIAGNOSIS — L03116 Cellulitis of left lower limb: Secondary | ICD-10-CM

## 2024-01-24 ENCOUNTER — Inpatient Hospital Stay: Admission: RE | Admit: 2024-01-24 | Source: Ambulatory Visit

## 2024-01-24 ENCOUNTER — Other Ambulatory Visit

## 2024-01-28 ENCOUNTER — Other Ambulatory Visit

## 2024-01-28 NOTE — Addendum Note (Signed)
 Addended by: Edra Govern D on: 01/28/2024 05:02 PM   Modules accepted: Orders

## 2024-01-28 NOTE — Progress Notes (Signed)
 Carelink Summary Report / Loop Recorder

## 2024-01-29 ENCOUNTER — Ambulatory Visit
Admission: RE | Admit: 2024-01-29 | Discharge: 2024-01-29 | Disposition: A | Discharge status: Home / Self Care | Source: Ambulatory Visit | Attending: Family Medicine | Admitting: Family Medicine

## 2024-01-29 DIAGNOSIS — L03116 Cellulitis of left lower limb: Secondary | ICD-10-CM

## 2024-02-20 ENCOUNTER — Ambulatory Visit (INDEPENDENT_AMBULATORY_CARE_PROVIDER_SITE_OTHER): Payer: Self-pay

## 2024-02-20 DIAGNOSIS — I639 Cerebral infarction, unspecified: Secondary | ICD-10-CM | POA: Diagnosis not present

## 2024-02-20 LAB — CUP PACEART REMOTE DEVICE CHECK
Date Time Interrogation Session: 20250611232733
Implantable Pulse Generator Implant Date: 20240109

## 2024-02-23 ENCOUNTER — Ambulatory Visit: Payer: Self-pay | Admitting: Internal Medicine

## 2024-03-09 NOTE — Progress Notes (Signed)
 Carelink Summary Report / Loop Recorder

## 2024-03-23 ENCOUNTER — Ambulatory Visit: Payer: Self-pay | Admitting: Internal Medicine

## 2024-03-23 ENCOUNTER — Ambulatory Visit (INDEPENDENT_AMBULATORY_CARE_PROVIDER_SITE_OTHER): Payer: Self-pay

## 2024-03-23 DIAGNOSIS — I639 Cerebral infarction, unspecified: Secondary | ICD-10-CM

## 2024-03-23 LAB — CUP PACEART REMOTE DEVICE CHECK
Date Time Interrogation Session: 20250713234434
Implantable Pulse Generator Implant Date: 20240109

## 2024-04-15 NOTE — Progress Notes (Signed)
 Carelink Summary Report / Loop Recorder

## 2024-04-23 ENCOUNTER — Ambulatory Visit (INDEPENDENT_AMBULATORY_CARE_PROVIDER_SITE_OTHER): Payer: Self-pay

## 2024-04-23 DIAGNOSIS — I639 Cerebral infarction, unspecified: Secondary | ICD-10-CM | POA: Diagnosis not present

## 2024-04-23 LAB — CUP PACEART REMOTE DEVICE CHECK
Date Time Interrogation Session: 20250813232923
Implantable Pulse Generator Implant Date: 20240109

## 2024-04-26 ENCOUNTER — Ambulatory Visit: Payer: Self-pay | Admitting: Internal Medicine

## 2024-04-30 NOTE — Telephone Encounter (Signed)
 Phone: [phone] (M) no answer and mailbox full

## 2024-04-30 NOTE — Telephone Encounter (Signed)
 Spoke with patient she is still having cramps and now chills.   Advised to be evaluated at [facility]

## 2024-04-30 NOTE — Telephone Encounter (Signed)
 Stomach cramps have increased.  She did have 2 bowel movements but it did not help the pain at all.  Asking to speak to nurse.  Transferred to [employer]

## 2024-05-04 ENCOUNTER — Emergency Department (HOSPITAL_BASED_OUTPATIENT_CLINIC_OR_DEPARTMENT_OTHER)
Admission: EM | Admit: 2024-05-04 | Discharge: 2024-05-04 | Disposition: A | Discharge status: Home / Self Care | Source: Ambulatory Visit | Attending: Emergency Medicine | Admitting: Emergency Medicine

## 2024-05-04 ENCOUNTER — Emergency Department (HOSPITAL_BASED_OUTPATIENT_CLINIC_OR_DEPARTMENT_OTHER)

## 2024-05-04 ENCOUNTER — Other Ambulatory Visit: Payer: Self-pay

## 2024-05-04 DIAGNOSIS — I10 Essential (primary) hypertension: Secondary | ICD-10-CM | POA: Insufficient documentation

## 2024-05-04 DIAGNOSIS — R109 Unspecified abdominal pain: Secondary | ICD-10-CM | POA: Diagnosis present

## 2024-05-04 DIAGNOSIS — K5732 Diverticulitis of large intestine without perforation or abscess without bleeding: Secondary | ICD-10-CM | POA: Insufficient documentation

## 2024-05-04 DIAGNOSIS — K5792 Diverticulitis of intestine, part unspecified, without perforation or abscess without bleeding: Secondary | ICD-10-CM

## 2024-05-04 DIAGNOSIS — Z79899 Other long term (current) drug therapy: Secondary | ICD-10-CM | POA: Insufficient documentation

## 2024-05-04 DIAGNOSIS — Z7982 Long term (current) use of aspirin: Secondary | ICD-10-CM | POA: Diagnosis not present

## 2024-05-04 DIAGNOSIS — D72829 Elevated white blood cell count, unspecified: Secondary | ICD-10-CM | POA: Insufficient documentation

## 2024-05-04 LAB — URINALYSIS, ROUTINE W REFLEX MICROSCOPIC
Bilirubin Urine: NEGATIVE
Glucose, UA: NEGATIVE mg/dL
Hgb urine dipstick: NEGATIVE
Ketones, ur: NEGATIVE mg/dL
Leukocytes,Ua: NEGATIVE
Nitrite: NEGATIVE
Protein, ur: NEGATIVE mg/dL
Specific Gravity, Urine: 1.028 (ref 1.005–1.030)
pH: 5.5 (ref 5.0–8.0)

## 2024-05-04 LAB — CBC
HCT: 32.3 % — ABNORMAL LOW (ref 36.0–46.0)
Hemoglobin: 10.6 g/dL — ABNORMAL LOW (ref 12.0–15.0)
MCH: 28 pg (ref 26.0–34.0)
MCHC: 32.8 g/dL (ref 30.0–36.0)
MCV: 85.2 fL (ref 80.0–100.0)
Platelets: 275 K/uL (ref 150–400)
RBC: 3.79 MIL/uL — ABNORMAL LOW (ref 3.87–5.11)
RDW: 14.3 % (ref 11.5–15.5)
WBC: 11.3 K/uL — ABNORMAL HIGH (ref 4.0–10.5)
nRBC: 0 % (ref 0.0–0.2)

## 2024-05-04 LAB — COMPREHENSIVE METABOLIC PANEL WITH GFR
ALT: 13 U/L (ref 0–44)
AST: 27 U/L (ref 15–41)
Albumin: 3.7 g/dL (ref 3.5–5.0)
Alkaline Phosphatase: 162 U/L — ABNORMAL HIGH (ref 38–126)
Anion gap: 11 (ref 5–15)
BUN: 16 mg/dL (ref 8–23)
CO2: 22 mmol/L (ref 22–32)
Calcium: 9.6 mg/dL (ref 8.9–10.3)
Chloride: 99 mmol/L (ref 98–111)
Creatinine, Ser: 0.82 mg/dL (ref 0.44–1.00)
GFR, Estimated: >60 mL/min (ref >60)
Glucose, Bld: 125 mg/dL — ABNORMAL HIGH (ref 70–99)
Potassium: 3.8 mmol/L (ref 3.5–5.1)
Sodium: 132 mmol/L — ABNORMAL LOW (ref 135–145)
Total Bilirubin: 0.5 mg/dL (ref 0.0–1.2)
Total Protein: 6.6 g/dL (ref 6.5–8.1)

## 2024-05-04 LAB — LIPASE, BLOOD: Lipase: 14 U/L (ref 11–51)

## 2024-05-04 MED ORDER — AMOXICILLIN-POT CLAVULANATE 875-125 MG PO TABS
1.0000 | ORAL_TABLET | Freq: Once | ORAL | Status: AC
  Administered 2024-05-04: 1 via ORAL
  Filled 2024-05-04: qty 1

## 2024-05-04 MED ORDER — AMOXICILLIN-POT CLAVULANATE 875-125 MG PO TABS: 1.0000 | ORAL_TABLET | Freq: Two times a day (BID) | ORAL | 0 refills | Status: AC

## 2024-05-04 MED ORDER — IOHEXOL 300 MG/ML  SOLN
100.0000 mL | Freq: Once | INTRAMUSCULAR | Status: AC | PRN
  Administered 2024-05-04: 100 mL via INTRAVENOUS

## 2024-05-04 MED ORDER — OXYCODONE-ACETAMINOPHEN 5-325 MG PO TABS: 1.0000 | ORAL_TABLET | Freq: Four times a day (QID) | ORAL | 0 refills | Status: AC | PRN

## 2024-05-04 MED ORDER — OXYCODONE-ACETAMINOPHEN 5-325 MG PO TABS
1.0000 | ORAL_TABLET | Freq: Once | ORAL | Status: AC
  Administered 2024-05-04: 1 via ORAL
  Filled 2024-05-04: qty 1

## 2024-05-04 MED ORDER — FENTANYL CITRATE PF 50 MCG/ML IJ SOSY
25.0000 ug | PREFILLED_SYRINGE | Freq: Once | INTRAMUSCULAR | Status: AC
  Administered 2024-05-04: 25 ug via INTRAVENOUS
  Filled 2024-05-04: qty 1

## 2024-05-04 MED ORDER — ONDANSETRON HCL 4 MG PO TABS: 4.0000 mg | ORAL_TABLET | Freq: Three times a day (TID) | ORAL | 0 refills | Status: AC | PRN

## 2024-05-04 NOTE — ED Triage Notes (Signed)
 Pt POV reporting persistent lower abd cramping, hx constipation, states it feels like same, last BM Sunday. Afebrile.

## 2024-05-04 NOTE — Discharge Instructions (Addendum)
 Your CT imaging showed evidence of diverticulitis.  This was uncomplicated and outpatient management can be attempted.  Take Augmentin , pain medication and Zofran  as needed, follow-up with your primary care doctor to ensure resolution, return to the emergency department for any severe worsening symptoms.

## 2024-05-04 NOTE — ED Notes (Addendum)
 Prior to giving Oxy, pt was made aware that this medication would cause some drowsiness and the ABX would be given for her infection. Pt asked, Will I be given these all as a prescription? I told pt that the doctor stated he would be writing pain medication and ABX prescriptions. Pt was also notified that opoid medications cause drowsiness and it is not recommended to drive while on said medications. 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    Upon d/c, I asked pt if she had called her ride, and pt immediately became upset because she says, No one told me I could not drive. I immediately told pt that RN did tell her that it is not recommended to drive while taking medications that cause drowsiness. I offered pt a ride by the [place] service, and pt declined. Pt was very upset and stated that her son would come to pick her up.

## 2024-05-04 NOTE — ED Provider Notes (Signed)
 [place] EMERGENCY DEPARTMENT AT [facility] Provider Note   CSN: [id number] Arrival date & time: 05/04/24  1658     Patient presents with: Constipation   Ellen Johnson is a 88 y.o. female.    Constipation    88 year old female with medical history significant for diverticulitis, HTN, GERD presenting to the emergency department with a chief complaint of abdominal pain.  The patient has had persistent abdominal cramping for the past several days.  Her last bowel movement was on Sunday and she is passing gas.  She denies any fevers or chills.  She is uncertain if she is constipated.  It was advised that she present to the emergency department for further evaluation by her primary care provider.  She denies any vomiting, she is tolerating p.o. intake.  She denies any genitourinary symptoms.  Prior to Admission medications   Medication Sig Start Date End Date Taking? Authorizing Provider  amoxicillin -clavulanate (AUGMENTIN ) 875-125 MG tablet Take 1 tablet by mouth every 12 (twelve) hours. 05/04/24  Yes Jerrol Agent, MD  ondansetron  (ZOFRAN ) 4 MG tablet Take 1 tablet (4 mg total) by mouth every 8 (eight) hours as needed for nausea or vomiting. 05/04/24  Yes Jerrol Agent, MD  oxyCODONE -acetaminophen  (PERCOCET/ROXICET) 5-325 MG tablet Take 1 tablet by mouth every 6 (six) hours as needed for severe pain (pain score 7-10). 05/04/24  Yes Jerrol Agent, MD  amLODipine  (NORVASC ) 5 MG tablet Take 1 tablet (5 mg total) by mouth daily. 04/16/22   Graham Krabbe, MD  aspirin  EC 81 MG tablet Take 1 tablet (81 mg total) by mouth daily. 04/17/22   Chien, Stephanie, MD  Cyanocobalamin  (VITAMIN B 12) 100 MCG LOZG Take 100 mg by mouth daily.    [provider]  cycloSPORINE modified (NEORAL) 25 MG capsule Take 25 mg by mouth daily. Patient states it was 1 t capsule twice daily- MD changed this recently - to once daily    [provider]  ferrous sulfate 324 MG TBEC Take 324 mg  by mouth daily with breakfast.    [provider]  fish oil-omega-3 fatty acids 1000 MG capsule Take 1 g by mouth daily.     [provider]  furosemide  (LASIX ) 20 MG tablet Take 1 tablet (20 mg total) by mouth daily. 04/16/22   Chien, Stephanie, MD  gabapentin (NEURONTIN) 100 MG capsule Take 100 mg by mouth daily.    [provider]  GINSENG PO Take by mouth daily.    [provider]  hydrOXYzine  (ATARAX ) 25 MG tablet Take 25 mg by mouth at bedtime as needed for itching.    [provider]  mirabegron ER (MYRBETRIQ) 25 MG TB24 tablet Take 25 mg by mouth daily.    [provider]  Multiple Vitamin (MULTIVITAMIN WITH MINERALS) TABS tablet Take 1 tablet by mouth daily. 04/17/22   Chien, Stephanie, MD  pantoprazole  (PROTONIX ) 40 MG tablet Take 1 tablet (40 mg total) by mouth daily. 04/17/22   Chien, Stephanie, MD  rosuvastatin  (CRESTOR ) 20 MG tablet Take 1 tablet (20 mg total) by mouth daily. 04/17/22   Chien, Stephanie, MD  vitamin C (ASCORBIC ACID) 500 MG tablet Take 500 mg by mouth daily. Takes only during winter months    [provider]    Allergies: Valium [diazepam]    Review of Systems  Gastrointestinal:  Positive for constipation.  All other systems reviewed and are negative.   Updated Vital Signs BP (!) 144/79   Pulse 89  Temp 98.8 F (37.1 C) (Temporal)   Resp 18   Ht 5' 5 (1.651 m)   Wt 66.7 kg   SpO2 96%   BMI 24.46 kg/m   Physical Exam Vitals and nursing note reviewed.  Constitutional:      General: She is not in acute distress.    Appearance: She is well-developed.  HENT:     Head: Normocephalic and atraumatic.  Eyes:     Conjunctiva/sclera: Conjunctivae normal.  Cardiovascular:     Rate and Rhythm: Normal rate and regular rhythm.     Heart sounds: No murmur heard. Pulmonary:     Effort: Pulmonary effort is normal. No respiratory distress.     Breath sounds: Normal breath sounds.  Abdominal:      Palpations: Abdomen is soft.     Tenderness: There is abdominal tenderness in the left lower quadrant. There is no guarding.  Musculoskeletal:        General: No swelling.     Cervical back: Neck supple.  Skin:    General: Skin is warm and dry.     Capillary Refill: Capillary refill takes less than 2 seconds.  Neurological:     Mental Status: She is alert.  Psychiatric:        Mood and Affect: Mood normal.     (all labs ordered are listed, but only abnormal results are displayed) Labs Reviewed  COMPREHENSIVE METABOLIC PANEL WITH GFR - Abnormal; Notable for the following components:      Result Value   Sodium 132 (*)    Glucose, Bld 125 (*)    Alkaline Phosphatase 162 (*)    All other components within normal limits  CBC - Abnormal; Notable for the following components:   WBC 11.3 (*)    RBC 3.79 (*)    Hemoglobin 10.6 (*)    HCT 32.3 (*)    All other components within normal limits  LIPASE, BLOOD  URINALYSIS, ROUTINE W REFLEX MICROSCOPIC    EKG: None  Radiology: CT ABDOMEN PELVIS W CONTRAST Result Date: 05/04/2024 CLINICAL DATA:  Left lower quadrant pain EXAM: CT ABDOMEN AND PELVIS WITH CONTRAST TECHNIQUE: Multidetector CT imaging of the abdomen and pelvis was performed using the standard protocol following bolus administration of intravenous contrast. RADIATION DOSE REDUCTION: This exam was performed according to the departmental dose-optimization program which includes automated exposure control, adjustment of the mA and/or kV according to patient size and/or use of iterative reconstruction technique. CONTRAST:  OMNIPAQUE  IOHEXOL  300 MG/ML  SOLN COMPARISON:  11/07/2018 FINDINGS: Lower chest: No acute abnormality. Hepatobiliary: No focal liver abnormality is seen. Status post cholecystectomy. No biliary dilatation. Pancreas: Unremarkable. No pancreatic ductal dilatation or surrounding inflammatory changes. Spleen: Normal in size without focal abnormality. Adrenals/Urinary  Tract: Adrenal glands are within normal limits. Kidneys demonstrate a normal enhancement pattern bilaterally. Normal excretion is noted bilaterally. No obstructive changes are seen. The bladder is well distended. Stomach/Bowel: The diverticular change of the colon is again identified with increased wall thickening and pericolonic inflammatory change at the junction of the descending and sigmoid colons consistent with acute diverticulitis. No perforation or abscess is identified this time. The more proximal colon shows no significant inflammatory change. The appendix has been surgically removed. Stomach and small bowel are unremarkable. Vascular/Lymphatic: Aortic atherosclerosis. No enlarged abdominal or pelvic lymph nodes. Reproductive: Status post hysterectomy. No adnexal masses. Previously seen gas in the vaginal fornices is no longer identified. Other: No free fluid is noted.  No free air is  seen. Musculoskeletal: Degenerative changes of lumbar spine are noted. IMPRESSION: Acute diverticulitis at the junction of the descending and sigmoid colons without evidence of abscess formation or perforation. No other focal abnormality is noted. Electronically Signed   By: Oneil Devonshire M.D.   On: 05/04/2024 20:36     Procedures   Medications Ordered in the ED  oxyCODONE -acetaminophen  (PERCOCET/ROXICET) 5-325 MG per tablet 1 tablet (has no administration in time range)  amoxicillin -clavulanate (AUGMENTIN ) 875-125 MG per tablet 1 tablet (has no administration in time range)  fentaNYL  (SUBLIMAZE ) injection 25 mcg (25 mcg Intravenous Given 05/04/24 1911)  iohexol  (OMNIPAQUE ) 300 MG/ML solution 100 mL (100 mLs Intravenous Contrast Given 05/04/24 1937)                                    Medical Decision Making Amount and/or Complexity of Data Reviewed Labs: ordered. Radiology: ordered.  Risk Prescription drug management.     88 year old female with medical history significant for diverticulitis, HTN, GERD  presenting to the emergency department with a chief complaint of abdominal pain.  The patient has had persistent abdominal cramping for the past several days.  Her last bowel movement was on Sunday and she is passing gas.  She denies any fevers or chills.  She is uncertain if she is constipated.  It was advised that she present to the emergency department for further evaluation by her primary care provider.  She denies any vomiting, she is tolerating p.o. intake.  She denies any genitourinary symptoms.  On arrival, the patient was afebrile, not tachycardic or tachypneic, hemodynamically stable.  Physical exam revealed left lower quadrant tenderness to palpation.  Concern for acute diverticulitis, considered bowel obstruction although feel less likely, considered constipation.  Will evaluate further with CT imaging and screening labs.  IV access was obtained the patient was administered IV fentanyl for pain control.  Labs: CBC with a mild leukocytosis to 11.3, hemoglobin 10.6, urinalysis negative, lipase normal, CMP with mild hyponatremia to 132, otherwise generally unremarkable, normal renal and liver function.  CT abdomen pelvis revealed acute uncomplicated diverticulitis: IMPRESSION:  Acute diverticulitis at the junction of the descending and sigmoid  colons without evidence of abscess formation or perforation.    No other focal abnormality is noted.    Patient still having pain on repeat assessment.  This requesting discharge.  Oral Percocet was prescribed and provided in the emergency department and the patient was subsequently p.o. challenged.  Will trial a course of outpatient Percocet, Augmentin, Zofran as needed for nausea.  Return precautions discussed, patient overall stable for discharge.     Final diagnoses:  Diverticulitis    ED Discharge Orders          Ordered    oxyCODONE-acetaminophen (PERCOCET/ROXICET) 5-325 MG tablet  Every 6 hours PRN        05/04/24 2128     amoxicillin-clavulanate (AUGMENTIN) 875-125 MG tablet  Every 12 hours        05/04/24 2128    ondansetron (ZOFRAN) 4 MG tablet  Every 8 hours PRN        08 /25/25 2128               Jerrol Agent, MD 05/04/24 2129

## 2024-05-25 ENCOUNTER — Ambulatory Visit (INDEPENDENT_AMBULATORY_CARE_PROVIDER_SITE_OTHER): Payer: Self-pay

## 2024-05-25 DIAGNOSIS — I639 Cerebral infarction, unspecified: Secondary | ICD-10-CM | POA: Diagnosis not present

## 2024-05-26 LAB — CUP PACEART REMOTE DEVICE CHECK
Date Time Interrogation Session: 20250914234004
Implantable Pulse Generator Implant Date: 20240109

## 2024-05-29 ENCOUNTER — Ambulatory Visit: Payer: Self-pay | Admitting: Internal Medicine

## 2024-06-01 NOTE — Progress Notes (Signed)
 Remote Loop Recorder Transmission

## 2024-06-04 NOTE — Progress Notes (Signed)
 Remote Loop Recorder Transmission

## 2024-06-17 NOTE — Progress Notes (Signed)
 Remote Loop Recorder Transmission

## 2024-06-25 ENCOUNTER — Ambulatory Visit: Payer: Self-pay

## 2024-06-25 ENCOUNTER — Ambulatory Visit: Payer: Self-pay | Admitting: Internal Medicine

## 2024-06-25 DIAGNOSIS — I639 Cerebral infarction, unspecified: Secondary | ICD-10-CM

## 2024-06-25 LAB — CUP PACEART REMOTE DEVICE CHECK
Date Time Interrogation Session: 20251015233024
Implantable Pulse Generator Implant Date: 20240109

## 2024-07-01 NOTE — Progress Notes (Unsigned)
 Remote Loop Recorder Transmission

## 2024-07-28 ENCOUNTER — Ambulatory Visit: Attending: Internal Medicine

## 2024-07-28 DIAGNOSIS — I639 Cerebral infarction, unspecified: Secondary | ICD-10-CM | POA: Diagnosis not present

## 2024-07-29 ENCOUNTER — Ambulatory Visit: Payer: Self-pay | Admitting: Internal Medicine

## 2024-07-29 LAB — CUP PACEART REMOTE DEVICE CHECK
Date Time Interrogation Session: 20251117232902
Implantable Pulse Generator Implant Date: 20240109

## 2024-07-31 NOTE — Progress Notes (Signed)
 Remote Loop Recorder Transmission

## 2024-08-28 ENCOUNTER — Ambulatory Visit

## 2024-08-28 DIAGNOSIS — I639 Cerebral infarction, unspecified: Secondary | ICD-10-CM | POA: Diagnosis not present

## 2024-08-28 LAB — CUP PACEART REMOTE DEVICE CHECK
Date Time Interrogation Session: 20251218232556
Implantable Pulse Generator Implant Date: 20240109

## 2024-08-31 NOTE — Progress Notes (Signed)
 Remote Loop Recorder Transmission

## 2024-09-06 ENCOUNTER — Ambulatory Visit: Payer: Self-pay | Admitting: Internal Medicine

## 2024-09-18 ENCOUNTER — Other Ambulatory Visit: Payer: Self-pay

## 2024-09-18 ENCOUNTER — Emergency Department (HOSPITAL_COMMUNITY)
Admission: EM | Admit: 2024-09-18 | Discharge: 2024-09-18 | Disposition: A | Discharge status: Home / Self Care | Attending: Emergency Medicine | Admitting: Emergency Medicine

## 2024-09-18 ENCOUNTER — Emergency Department (HOSPITAL_COMMUNITY)

## 2024-09-18 DIAGNOSIS — R1084 Generalized abdominal pain: Secondary | ICD-10-CM | POA: Diagnosis not present

## 2024-09-18 DIAGNOSIS — I10 Essential (primary) hypertension: Secondary | ICD-10-CM | POA: Insufficient documentation

## 2024-09-18 DIAGNOSIS — S0990XA Unspecified injury of head, initial encounter: Secondary | ICD-10-CM | POA: Insufficient documentation

## 2024-09-18 DIAGNOSIS — S2231XA Fracture of one rib, right side, initial encounter for closed fracture: Secondary | ICD-10-CM | POA: Diagnosis not present

## 2024-09-18 DIAGNOSIS — Z79899 Other long term (current) drug therapy: Secondary | ICD-10-CM | POA: Insufficient documentation

## 2024-09-18 DIAGNOSIS — R079 Chest pain, unspecified: Secondary | ICD-10-CM | POA: Diagnosis present

## 2024-09-18 DIAGNOSIS — Z7982 Long term (current) use of aspirin: Secondary | ICD-10-CM | POA: Diagnosis not present

## 2024-09-18 DIAGNOSIS — I6782 Cerebral ischemia: Secondary | ICD-10-CM | POA: Diagnosis not present

## 2024-09-18 DIAGNOSIS — Y92410 Unspecified street and highway as the place of occurrence of the external cause: Secondary | ICD-10-CM | POA: Diagnosis not present

## 2024-09-18 LAB — BASIC METABOLIC PANEL WITH GFR
Anion gap: 11 (ref 5–15)
BUN: 12 mg/dL (ref 8–23)
CO2: 25 mmol/L (ref 22–32)
Calcium: 10.3 mg/dL (ref 8.9–10.3)
Chloride: 99 mmol/L (ref 98–111)
Creatinine, Ser: 0.74 mg/dL (ref 0.44–1.00)
GFR, Estimated: >60 mL/min
Glucose, Bld: 117 mg/dL — ABNORMAL HIGH (ref 70–99)
Potassium: 3.7 mmol/L (ref 3.5–5.1)
Sodium: 135 mmol/L (ref 135–145)

## 2024-09-18 LAB — TROPONIN T, HIGH SENSITIVITY
Troponin T High Sensitivity: 30 ng/L — ABNORMAL HIGH (ref 0–19)
Troponin T High Sensitivity: 28 ng/L — ABNORMAL HIGH (ref 0–19)

## 2024-09-18 LAB — CBC
HCT: 36.9 % (ref 36.0–46.0)
Hemoglobin: 12.1 g/dL (ref 12.0–15.0)
MCH: 27.1 pg (ref 26.0–34.0)
MCHC: 32.8 g/dL (ref 30.0–36.0)
MCV: 82.6 fL (ref 80.0–100.0)
Platelets: 272 K/uL (ref 150–400)
RBC: 4.47 MIL/uL (ref 3.87–5.11)
RDW: 17.2 % — ABNORMAL HIGH (ref 11.5–15.5)
WBC: 7.5 K/uL (ref 4.0–10.5)
nRBC: 0 % (ref 0.0–0.2)

## 2024-09-18 MED ORDER — ACETAMINOPHEN 500 MG PO TABS
1000.0000 mg | ORAL_TABLET | Freq: Once | ORAL | Status: AC
  Administered 2024-09-18: 1000 mg via ORAL
  Filled 2024-09-18: qty 2

## 2024-09-18 MED ORDER — IOHEXOL 300 MG/ML  SOLN
100.0000 mL | Freq: Once | INTRAMUSCULAR | Status: AC | PRN
  Administered 2024-09-18: 80 mL via INTRAVENOUS

## 2024-09-18 MED ORDER — ONDANSETRON HCL 4 MG/2ML IJ SOLN
4.0000 mg | Freq: Once | INTRAMUSCULAR | Status: AC
  Administered 2024-09-18: 4 mg via INTRAVENOUS
  Filled 2024-09-18: qty 2
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
## 2024-09-18 NOTE — Discharge Instructions (Signed)
 You have a crack in your fifth rib on your right side.  You can take of 1000 mg of Tylenol  every 8 hours for this.  Your symptoms should begin to improve over the next few days to weeks.  Return to the emergency room if you have worsening pain in your chest, difficulty breathing, fever or loss of consciousness.  Follow-up with your primary care doctor.
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
## 2024-09-24 ENCOUNTER — Telehealth: Payer: Self-pay

## 2024-09-24 NOTE — Telephone Encounter (Signed)
 Alert received from [employer] for AF in progress, not always good rate control, burden 99.9%, no OAC.  Attempted to contact patient. No answer, left message to call back.
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
## 2024-09-25 NOTE — Telephone Encounter (Signed)
 Attempted outreach to patient's primary contact number to assess her for symptoms and her notify of the Afib found on her loop device  No answer  Voicemail box full and unable to leave message to call back ____________________________________________________________________________  Attempted outreach to patient's daughter Ellen Johnson)  No answer  Left detailed message asking daughter to have patient call the device clinic (number provided) ASAP  _____________________________________________________________________________  [place] and got a hold of patient's other listed daughter Ellen Johnson), but she told this RN that she was at work currently and requested a call back after 3pm

## 2024-09-25 NOTE — Telephone Encounter (Signed)
 Pt called back returning call, she states she will keep clearing her vm so that we are able to leave a msg in case she doesn't answer

## 2024-09-28 ENCOUNTER — Ambulatory Visit: Attending: Cardiology

## 2024-09-28 DIAGNOSIS — I639 Cerebral infarction, unspecified: Secondary | ICD-10-CM | POA: Diagnosis not present

## 2024-09-29 LAB — CUP PACEART REMOTE DEVICE CHECK
Date Time Interrogation Session: 20260118233027
Implantable Pulse Generator Implant Date: 20240109

## 2024-10-02 NOTE — Progress Notes (Signed)
 Remote Loop Recorder Transmission

## 2024-10-05 ENCOUNTER — Ambulatory Visit: Payer: Self-pay | Admitting: Cardiology

## 2024-10-09 ENCOUNTER — Telehealth: Payer: Self-pay

## 2024-10-09 NOTE — Telephone Encounter (Signed)
 Call received from Pt.  She needs to schedule her afib clinic visit.    Offered February 6 at 2:00 pm.  She will contact her pastor to see if he can bring her at that time and will call back.  Appointment has been scheduled-advise Pt to go to 4th floor check in zone B.
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
## 2024-10-16 ENCOUNTER — Ambulatory Visit (HOSPITAL_COMMUNITY): Admitting: Internal Medicine
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
## 2024-10-16 NOTE — Progress Notes (Incomplete)
 "   Primary Care Physician: Lazoff, Shawn P, DO Primary Cardiologist: None Electrophysiologist: None  {Click to update primary MD,subspecialty MD or APP then REFRESH:1}   Referring Physician: Device clinic  {Removed [facility], PMH, PSH, ALLERGY, CMED, and SOC :1}   LEEZA HEINER is a 89 y.o. female with a history of HTN, cryptogenic stroke, and paroxysmal atrial fibrillation who presents for consultation in the [facility].  Most recent ILR check by Dr. Kennyth on 10/05/2024 showed new A-fib burden 16.8%.  Patient is on ASA for stroke prevention.  On evaluation today, patient is currently in***.  She does not have cardiac awareness of arrhythmia.  Labs performed yesterday showed anemia with hemoglobin level 10.7.  Patient is currently on iron supplementation.  Today, she denies symptoms of ***palpitations, chest pain, shortness of breath, orthopnea, PND, lower extremity edema, dizziness, presyncope, syncope, bleeding, or neurologic sequela. The patient is tolerating medications without difficulties and is otherwise without complaint today.    she has a BMI of There is no height or weight on file to calculate BMI.. There were no vitals filed for this visit.  Current Outpatient Medications  Medication Sig Dispense Refill   amLODipine  (NORVASC ) 5 MG tablet Take 1 tablet (5 mg total) by mouth daily.     amoxicillin -clavulanate (AUGMENTIN ) 875-125 MG tablet Take 1 tablet by mouth every 12 (twelve) hours. 14 tablet 0   aspirin  EC 81 MG tablet Take 1 tablet (81 mg total) by mouth daily. 30 tablet 1   Cyanocobalamin  (VITAMIN B 12) 100 MCG LOZG Take 100 mg by mouth daily.     cycloSPORINE modified (NEORAL) 25 MG capsule Take 25 mg by mouth daily. Patient states it was 1 t capsule twice daily- MD changed this recently - to once daily     ferrous sulfate 324 MG TBEC Take 324 mg by mouth daily with breakfast.     fish oil-omega-3 fatty acids 1000 MG capsule Take 1 g by mouth  daily.      furosemide  (LASIX ) 20 MG tablet Take 1 tablet (20 mg total) by mouth daily.     gabapentin (NEURONTIN) 100 MG capsule Take 100 mg by mouth daily.     GINSENG PO Take by mouth daily.     hydrOXYzine  (ATARAX ) 25 MG tablet Take 25 mg by mouth at bedtime as needed for itching.     mirabegron ER (MYRBETRIQ) 25 MG TB24 tablet Take 25 mg by mouth daily.     Multiple Vitamin (MULTIVITAMIN WITH MINERALS) TABS tablet Take 1 tablet by mouth daily.     ondansetron  (ZOFRAN ) 4 MG tablet Take 1 tablet (4 mg total) by mouth every 8 (eight) hours as needed for nausea or vomiting. 20 tablet 0   oxyCODONE -acetaminophen  (PERCOCET/ROXICET) 5-325 MG tablet Take 1 tablet by mouth every 6 (six) hours as needed for severe pain (pain score 7-10). 15 tablet 0   pantoprazole  (PROTONIX ) 40 MG tablet Take 1 tablet (40 mg total) by mouth daily. 30 tablet 1   rosuvastatin  (CRESTOR ) 20 MG tablet Take 1 tablet (20 mg total) by mouth daily. 30 tablet 1   vitamin C (ASCORBIC ACID) 500 MG tablet Take 500 mg by mouth daily. Takes only during winter months     No current facility-administered medications for this visit.    Atrial Fibrillation Management history:  Previous antiarrhythmic drugs: none Previous cardioversions: none Previous ablations: none Anticoagulation history: none   ROS- All systems are reviewed and negative except as per  the HPI above.  Physical Exam: There were no vitals taken for this visit.  GEN: Well nourished, well developed in no acute distress NECK: No JVD; No carotid bruits CARDIAC: {EPRHYTHM:28826}, no murmurs, rubs, gallops RESPIRATORY:  Clear to auscultation without rales, wheezing or rhonchi  ABDOMEN: Soft, non-tender, non-distended EXTREMITIES:  No edema; No deformity   EKG today demonstrates ***  EKG Interpretation Date/Time:    Ventricular Rate:    PR Interval:    QRS Duration:    QT Interval:    QTC Calculation:   R Axis:      Text Interpretation:           Echo 04/14/2022 demonstrated   1. Left ventricular ejection fraction, by estimation, is 60 to 65%. The  left ventricle has normal function. The left ventricle has no regional  wall motion abnormalities. Left ventricular diastolic parameters are  consistent with Grade I diastolic  dysfunction (impaired relaxation).   2. Right ventricular systolic function is normal. The right ventricular  size is normal.   3. Left atrial size was mild to moderately dilated.   4. The mitral valve is normal in structure. Trivial mitral valve  regurgitation. No evidence of mitral stenosis.   5. The aortic valve is tricuspid. Aortic valve regurgitation is moderate.  Aortic valve sclerosis is present, with no evidence of aortic valve  stenosis.   6. Aortic dilatation noted. There is mild dilatation of the ascending  aorta, measuring 40 mm.   7. The inferior vena cava is normal in size with greater than 50%  respiratory variability, suggesting right atrial pressure of 3 mmHg.   ASSESSMENT & PLAN CHA2DS2-VASc Score =    The patient's score is based upon:   {Click here to calculate score.  REFRESH note before signing. :1}    Chad 6 with HTN and CVA  ASSESSMENT AND PLAN: {Select the correct AFib Diagnosis                 :[id number]}  PAF  Eliquis 5 mg twice daily.  Dosage is correct based on weight greater than 60 kg and creatinine less than 1.5.  Plan to obtain repeat CBC in 1 month.   Follow up ***   Terra [facility], [facility] [address] [place], [place] [address] [phone]  "

## 2024-10-29 ENCOUNTER — Ambulatory Visit

## 2024-11-29 ENCOUNTER — Ambulatory Visit

## 2024-12-01 ENCOUNTER — Ambulatory Visit: Admitting: Physician Assistant

## 2024-12-30 ENCOUNTER — Ambulatory Visit

## 2025-01-30 ENCOUNTER — Ambulatory Visit

## 2025-03-02 ENCOUNTER — Ambulatory Visit

## 2025-04-02 ENCOUNTER — Ambulatory Visit

## 2025-05-03 ENCOUNTER — Ambulatory Visit

## 2025-06-03 ENCOUNTER — Ambulatory Visit

## 2025-07-04 ENCOUNTER — Ambulatory Visit

## 2025-08-04 ENCOUNTER — Ambulatory Visit

## 2025-09-04 ENCOUNTER — Ambulatory Visit

## 2025-10-05 ENCOUNTER — Ambulatory Visit
